# Patient Record
Sex: Female | Born: 1935 | Race: White | Hispanic: No | Smoking: Former smoker
Health system: Southern US, Community
[De-identification: ages and names within clinical notes are randomized; demographics above are authoritative.]

## PROBLEM LIST (undated history)

## (undated) DIAGNOSIS — I5022 Chronic systolic (congestive) heart failure: Secondary | ICD-10-CM

## (undated) DIAGNOSIS — J310 Chronic rhinitis: Secondary | ICD-10-CM

## (undated) DIAGNOSIS — Z95810 Presence of automatic (implantable) cardiac defibrillator: Secondary | ICD-10-CM

## (undated) DIAGNOSIS — M1990 Unspecified osteoarthritis, unspecified site: Secondary | ICD-10-CM

## (undated) DIAGNOSIS — G4730 Sleep apnea, unspecified: Secondary | ICD-10-CM

## (undated) DIAGNOSIS — R531 Weakness: Secondary | ICD-10-CM

## (undated) DIAGNOSIS — Z9889 Other specified postprocedural states: Secondary | ICD-10-CM

## (undated) DIAGNOSIS — R079 Chest pain, unspecified: Secondary | ICD-10-CM

## (undated) DIAGNOSIS — R05 Cough: Secondary | ICD-10-CM

## (undated) DIAGNOSIS — K635 Polyp of colon: Secondary | ICD-10-CM

## (undated) DIAGNOSIS — F329 Major depressive disorder, single episode, unspecified: Secondary | ICD-10-CM

## (undated) DIAGNOSIS — G8929 Other chronic pain: Secondary | ICD-10-CM

## (undated) DIAGNOSIS — E079 Disorder of thyroid, unspecified: Secondary | ICD-10-CM

## (undated) DIAGNOSIS — K219 Gastro-esophageal reflux disease without esophagitis: Secondary | ICD-10-CM

## (undated) DIAGNOSIS — G2581 Restless legs syndrome: Secondary | ICD-10-CM

## (undated) DIAGNOSIS — R058 Other specified cough: Secondary | ICD-10-CM

## (undated) DIAGNOSIS — R296 Repeated falls: Secondary | ICD-10-CM

## (undated) DIAGNOSIS — E039 Hypothyroidism, unspecified: Secondary | ICD-10-CM

## (undated) DIAGNOSIS — G4733 Obstructive sleep apnea (adult) (pediatric): Secondary | ICD-10-CM

## (undated) DIAGNOSIS — R002 Palpitations: Secondary | ICD-10-CM

## (undated) DIAGNOSIS — T464X5A Adverse effect of angiotensin-converting-enzyme inhibitors, initial encounter: Secondary | ICD-10-CM

## (undated) DIAGNOSIS — I509 Heart failure, unspecified: Secondary | ICD-10-CM

## (undated) DIAGNOSIS — N189 Chronic kidney disease, unspecified: Secondary | ICD-10-CM

## (undated) DIAGNOSIS — F0390 Unspecified dementia without behavioral disturbance: Secondary | ICD-10-CM

## (undated) DIAGNOSIS — Z9989 Dependence on other enabling machines and devices: Secondary | ICD-10-CM

## (undated) DIAGNOSIS — E876 Hypokalemia: Secondary | ICD-10-CM

## (undated) DIAGNOSIS — W19XXXA Unspecified fall, initial encounter: Secondary | ICD-10-CM

## (undated) DIAGNOSIS — M549 Dorsalgia, unspecified: Secondary | ICD-10-CM

## (undated) DIAGNOSIS — J42 Unspecified chronic bronchitis: Secondary | ICD-10-CM

## (undated) DIAGNOSIS — N289 Disorder of kidney and ureter, unspecified: Secondary | ICD-10-CM

## (undated) DIAGNOSIS — F32A Depression, unspecified: Secondary | ICD-10-CM

## (undated) DIAGNOSIS — I4891 Unspecified atrial fibrillation: Secondary | ICD-10-CM

## (undated) DIAGNOSIS — F419 Anxiety disorder, unspecified: Secondary | ICD-10-CM

## (undated) HISTORY — DX: Chronic kidney disease, unspecified: N18.9

## (undated) HISTORY — DX: Gastro-esophageal reflux disease without esophagitis: K21.9

## (undated) HISTORY — DX: Disorder of kidney and ureter, unspecified: N28.9

## (undated) HISTORY — DX: Weakness: R53.1

## (undated) HISTORY — DX: Unspecified osteoarthritis, unspecified site: M19.90

## (undated) HISTORY — DX: Cough: R05

## (undated) HISTORY — DX: Other specified postprocedural states: Z98.890

## (undated) HISTORY — DX: Sleep apnea, unspecified: G47.30

## (undated) HISTORY — DX: Polyp of colon: K63.5

## (undated) HISTORY — DX: Chronic rhinitis: J31.0

## (undated) HISTORY — DX: Unspecified chronic bronchitis: J42

## (undated) HISTORY — DX: Anxiety disorder, unspecified: F41.9

## (undated) HISTORY — DX: Hypokalemia: E87.6

## (undated) HISTORY — DX: Dependence on other enabling machines and devices: Z99.89

## (undated) HISTORY — DX: Presence of automatic (implantable) cardiac defibrillator: Z95.810

## (undated) HISTORY — DX: Unspecified dementia, unspecified severity, without behavioral disturbance, psychotic disturbance, mood disturbance, and anxiety: F03.90

## (undated) HISTORY — DX: Repeated falls: R29.6

## (undated) HISTORY — DX: Major depressive disorder, single episode, unspecified: F32.9

## (undated) HISTORY — DX: Restless legs syndrome: G25.81

## (undated) HISTORY — DX: Chronic systolic (congestive) heart failure: I50.22

## (undated) HISTORY — DX: Hypothyroidism, unspecified: E03.9

## (undated) HISTORY — DX: Dorsalgia, unspecified: M54.9

## (undated) HISTORY — DX: Depression, unspecified: F32.A

## (undated) HISTORY — DX: Adverse effect of angiotensin-converting-enzyme inhibitors, initial encounter: T46.4X5A

## (undated) HISTORY — DX: Disorder of thyroid, unspecified: E07.9

## (undated) HISTORY — DX: Heart failure, unspecified: I50.9

## (undated) HISTORY — DX: Unspecified fall, initial encounter: W19.XXXA

## (undated) HISTORY — DX: Unspecified atrial fibrillation: I48.91

## (undated) HISTORY — DX: Other specified cough: R05.8

## (undated) HISTORY — PX: SKIN CANCER EXCISION: SHX779

## (undated) HISTORY — DX: Chest pain, unspecified: R07.9

## (undated) HISTORY — DX: Obstructive sleep apnea (adult) (pediatric): G47.33

## (undated) HISTORY — DX: Other chronic pain: G89.29

## (undated) HISTORY — DX: Palpitations: R00.2

---

## 1971-05-11 HISTORY — PX: BACK SURGERY: SHX140

## 2003-05-11 DIAGNOSIS — Z9889 Other specified postprocedural states: Secondary | ICD-10-CM

## 2003-05-11 HISTORY — DX: Other specified postprocedural states: Z98.890

## 2003-05-11 HISTORY — PX: CARDIAC DEFIBRILLATOR PLACEMENT: SHX171

## 2003-05-13 LAB — HM PAP SMEAR

## 2003-05-13 LAB — HM COLONOSCOPY

## 2003-05-13 LAB — HM DEXA SCAN

## 2003-05-13 LAB — HM MAMMOGRAPHY

## 2003-07-26 ENCOUNTER — Inpatient Hospital Stay (HOSPITAL_COMMUNITY): Admission: EM | Admit: 2003-07-26 | Discharge: 2003-07-30 | Payer: Self-pay | Admitting: Emergency Medicine

## 2003-07-26 ENCOUNTER — Encounter (INDEPENDENT_AMBULATORY_CARE_PROVIDER_SITE_OTHER): Payer: Self-pay | Admitting: *Deleted

## 2003-07-29 ENCOUNTER — Encounter (INDEPENDENT_AMBULATORY_CARE_PROVIDER_SITE_OTHER): Payer: Self-pay | Admitting: Cardiology

## 2003-07-29 HISTORY — PX: CARDIAC CATHETERIZATION: SHX172

## 2003-11-20 ENCOUNTER — Other Ambulatory Visit: Admission: RE | Admit: 2003-11-20 | Discharge: 2003-11-20 | Payer: Self-pay | Admitting: Obstetrics and Gynecology

## 2003-11-27 ENCOUNTER — Encounter: Admission: RE | Admit: 2003-11-27 | Discharge: 2003-11-27 | Payer: Self-pay | Admitting: Obstetrics and Gynecology

## 2003-12-16 ENCOUNTER — Ambulatory Visit (HOSPITAL_COMMUNITY): Admission: RE | Admit: 2003-12-16 | Discharge: 2003-12-16 | Payer: Self-pay | Admitting: Gastroenterology

## 2003-12-16 ENCOUNTER — Encounter (INDEPENDENT_AMBULATORY_CARE_PROVIDER_SITE_OTHER): Payer: Self-pay | Admitting: *Deleted

## 2004-01-10 ENCOUNTER — Inpatient Hospital Stay (HOSPITAL_COMMUNITY): Admission: AD | Admit: 2004-01-10 | Discharge: 2004-01-11 | Payer: Self-pay | Admitting: Internal Medicine

## 2004-01-10 HISTORY — PX: INSERT / REPLACE / REMOVE PACEMAKER: SUR710

## 2004-01-15 ENCOUNTER — Inpatient Hospital Stay (HOSPITAL_COMMUNITY): Admission: AD | Admit: 2004-01-15 | Discharge: 2004-01-16 | Payer: Self-pay | Admitting: Internal Medicine

## 2004-01-15 HISTORY — PX: CARDIAC DEFIBRILLATOR PLACEMENT: SHX171

## 2004-03-13 ENCOUNTER — Inpatient Hospital Stay (HOSPITAL_COMMUNITY): Admission: EM | Admit: 2004-03-13 | Discharge: 2004-03-16 | Payer: Self-pay | Admitting: Emergency Medicine

## 2004-04-22 ENCOUNTER — Ambulatory Visit: Payer: Self-pay

## 2004-04-22 ENCOUNTER — Ambulatory Visit: Payer: Self-pay | Admitting: Internal Medicine

## 2004-09-14 ENCOUNTER — Ambulatory Visit: Payer: Self-pay | Admitting: Internal Medicine

## 2004-11-17 ENCOUNTER — Ambulatory Visit: Payer: Self-pay

## 2004-11-17 ENCOUNTER — Ambulatory Visit: Payer: Self-pay | Admitting: Internal Medicine

## 2004-11-27 IMAGING — CR DG CHEST 1V PORT
1 series · 1 of 1 positions shown · non-contrast
Comparison: yesterday.

CLINICAL DATA: congestive heart failure; post-pacemaker placement
 PORTABLE CHEST 01/16/04 ([DATE] hours):

[view not recorded]
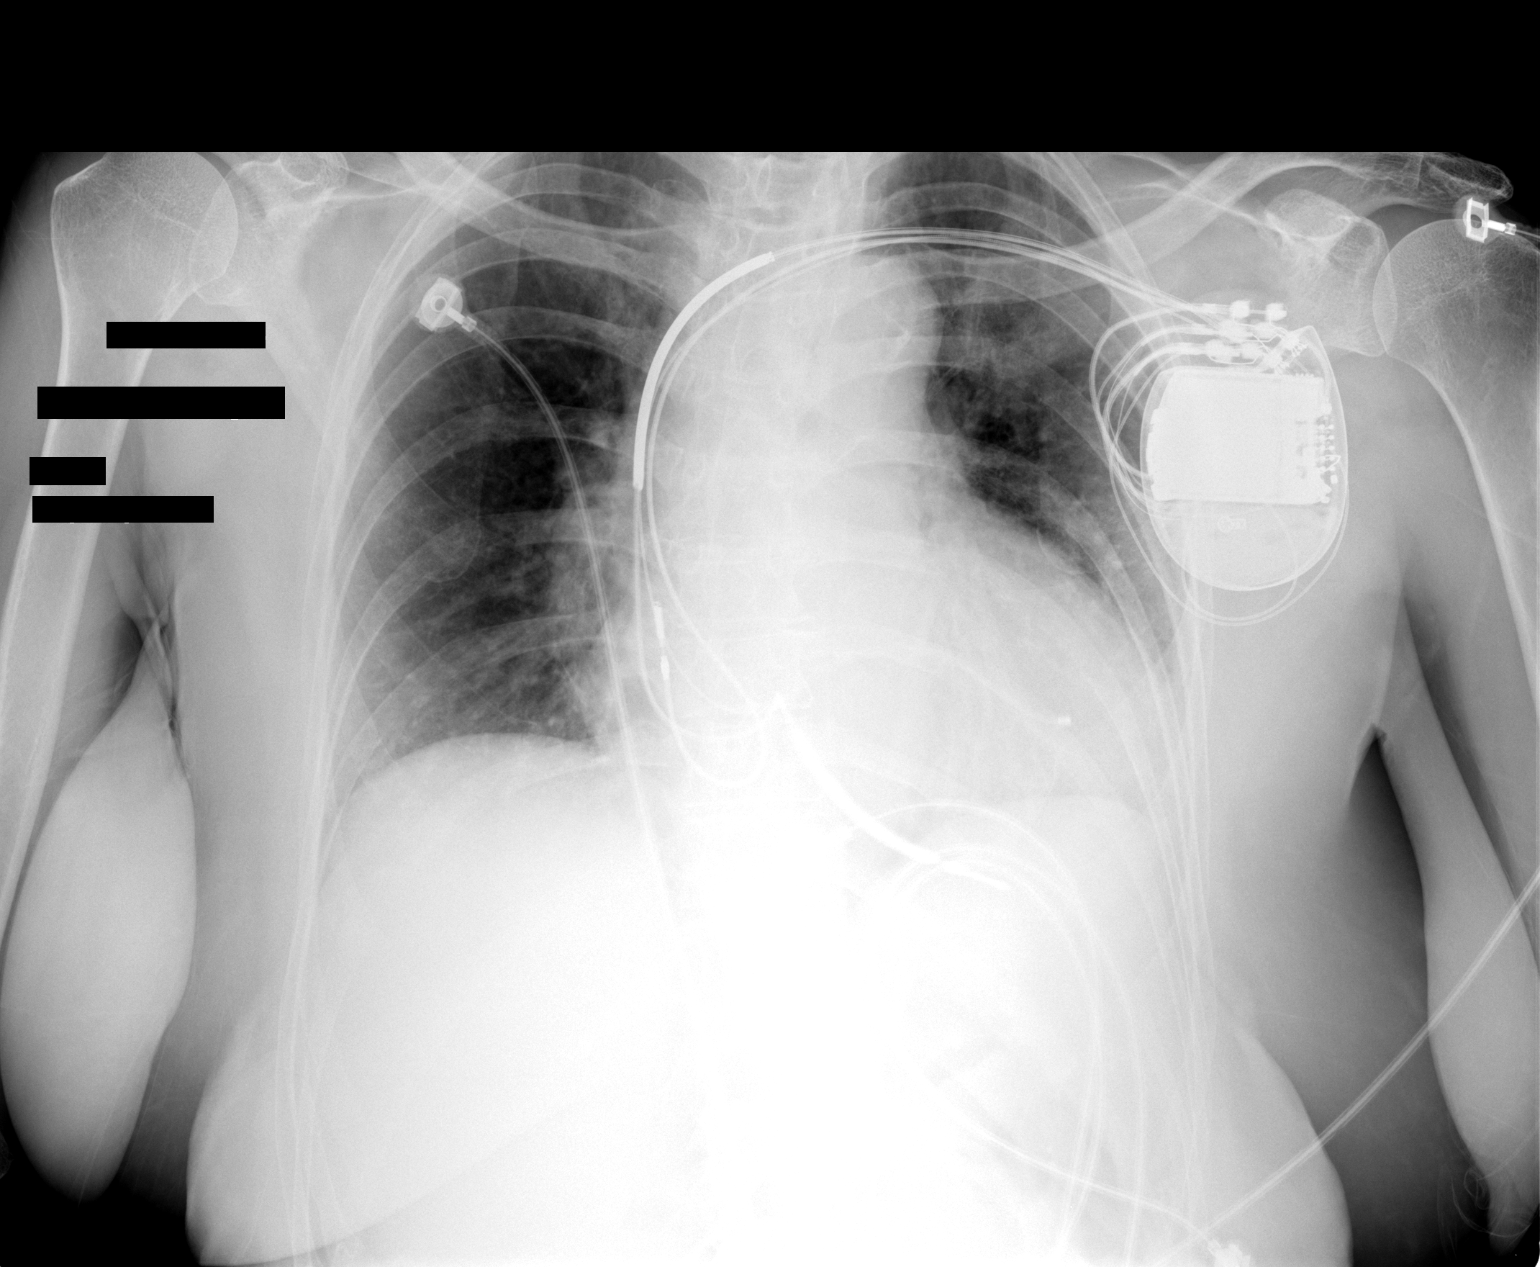

[1 of 1 positions shown; findings below may reference images not displayed]

There is a new left subclavian transvenous AICD with biventricular leads and a right atrial lead.  There is no pneumothorax.  The heart remains enlarged.  There are lower lung volumes with resulting mild basilar atelectasis and vascular congestion.  No overt pulmonary edema seen.
 IMPRESSION
 AICD leads satisfactorily positioned.  
 Low lung volumes with vascular congestion.  No pneumothorax.

## 2005-06-15 ENCOUNTER — Ambulatory Visit: Payer: Self-pay | Admitting: Internal Medicine

## 2005-08-30 ENCOUNTER — Ambulatory Visit: Payer: Self-pay | Admitting: Pulmonary Disease

## 2005-09-13 ENCOUNTER — Ambulatory Visit: Payer: Self-pay | Admitting: Pulmonary Disease

## 2005-09-13 ENCOUNTER — Ambulatory Visit (HOSPITAL_BASED_OUTPATIENT_CLINIC_OR_DEPARTMENT_OTHER): Admission: RE | Admit: 2005-09-13 | Discharge: 2005-09-13 | Payer: Self-pay | Admitting: Pulmonary Disease

## 2005-09-30 ENCOUNTER — Ambulatory Visit: Payer: Self-pay | Admitting: Pulmonary Disease

## 2005-10-02 ENCOUNTER — Ambulatory Visit (HOSPITAL_BASED_OUTPATIENT_CLINIC_OR_DEPARTMENT_OTHER): Admission: RE | Admit: 2005-10-02 | Discharge: 2005-10-02 | Payer: Self-pay | Admitting: Pulmonary Disease

## 2005-11-03 ENCOUNTER — Ambulatory Visit: Payer: Self-pay | Admitting: Pulmonary Disease

## 2005-11-30 ENCOUNTER — Ambulatory Visit: Payer: Self-pay | Admitting: Pulmonary Disease

## 2006-02-28 ENCOUNTER — Ambulatory Visit: Payer: Self-pay | Admitting: Pulmonary Disease

## 2006-08-09 ENCOUNTER — Ambulatory Visit: Payer: Self-pay | Admitting: Pulmonary Disease

## 2006-09-08 ENCOUNTER — Ambulatory Visit: Payer: Self-pay | Admitting: Pulmonary Disease

## 2006-10-12 ENCOUNTER — Ambulatory Visit: Payer: Self-pay | Admitting: Pulmonary Disease

## 2007-04-20 DIAGNOSIS — I5032 Chronic diastolic (congestive) heart failure: Secondary | ICD-10-CM

## 2007-04-20 DIAGNOSIS — G2581 Restless legs syndrome: Secondary | ICD-10-CM | POA: Insufficient documentation

## 2007-04-20 DIAGNOSIS — G4733 Obstructive sleep apnea (adult) (pediatric): Secondary | ICD-10-CM

## 2007-08-01 ENCOUNTER — Ambulatory Visit: Payer: Self-pay | Admitting: Pulmonary Disease

## 2007-08-01 DIAGNOSIS — J310 Chronic rhinitis: Secondary | ICD-10-CM

## 2007-08-01 DIAGNOSIS — J42 Unspecified chronic bronchitis: Secondary | ICD-10-CM

## 2007-08-04 ENCOUNTER — Encounter: Payer: Self-pay | Admitting: Pulmonary Disease

## 2007-09-23 ENCOUNTER — Encounter: Admission: RE | Admit: 2007-09-23 | Discharge: 2007-09-23 | Payer: Self-pay | Admitting: Orthopaedic Surgery

## 2007-12-16 ENCOUNTER — Inpatient Hospital Stay (HOSPITAL_COMMUNITY): Admission: EM | Admit: 2007-12-16 | Discharge: 2007-12-20 | Payer: Self-pay | Admitting: Emergency Medicine

## 2008-05-07 ENCOUNTER — Telehealth (INDEPENDENT_AMBULATORY_CARE_PROVIDER_SITE_OTHER): Payer: Self-pay | Admitting: *Deleted

## 2008-05-10 HISTORY — PX: FEMUR FRACTURE SURGERY: SHX633

## 2008-05-21 ENCOUNTER — Ambulatory Visit: Payer: Self-pay | Admitting: Pulmonary Disease

## 2008-05-23 ENCOUNTER — Encounter: Payer: Self-pay | Admitting: Pulmonary Disease

## 2008-12-12 ENCOUNTER — Ambulatory Visit: Payer: Self-pay | Admitting: Pulmonary Disease

## 2008-12-26 ENCOUNTER — Inpatient Hospital Stay (HOSPITAL_COMMUNITY): Admission: AD | Admit: 2008-12-26 | Discharge: 2009-01-01 | Payer: Self-pay | Admitting: Specialist

## 2008-12-26 ENCOUNTER — Encounter: Payer: Self-pay | Admitting: Emergency Medicine

## 2009-03-14 ENCOUNTER — Ambulatory Visit: Payer: Self-pay | Admitting: Internal Medicine

## 2009-03-14 DIAGNOSIS — I4891 Unspecified atrial fibrillation: Secondary | ICD-10-CM

## 2009-03-17 ENCOUNTER — Ambulatory Visit (HOSPITAL_COMMUNITY): Admission: RE | Admit: 2009-03-17 | Discharge: 2009-03-17 | Payer: Self-pay | Admitting: Internal Medicine

## 2009-03-17 ENCOUNTER — Ambulatory Visit: Payer: Self-pay | Admitting: Internal Medicine

## 2009-03-17 HISTORY — PX: IMPLANTABLE CARDIOVERTER DEFIBRILLATOR IMPLANT: SHX5860

## 2009-03-20 ENCOUNTER — Ambulatory Visit: Payer: Self-pay

## 2009-03-20 ENCOUNTER — Ambulatory Visit: Payer: Self-pay | Admitting: Internal Medicine

## 2009-03-20 DIAGNOSIS — E876 Hypokalemia: Secondary | ICD-10-CM

## 2009-03-21 LAB — CONVERTED CEMR LAB
BUN: 21 mg/dL (ref 6–23)
CO2: 31 meq/L (ref 19–32)
Calcium: 9.9 mg/dL (ref 8.4–10.5)
Chloride: 101 meq/L (ref 96–112)
Creatinine, Ser: 1.4 mg/dL — ABNORMAL HIGH (ref 0.4–1.2)
GFR calc non Af Amer: 38.23 mL/min (ref >60)
Glucose, Bld: 104 mg/dL — ABNORMAL HIGH (ref 70–99)
Potassium: 5 meq/L (ref 3.5–5.1)
Sodium: 140 meq/L (ref 135–145)

## 2009-03-25 ENCOUNTER — Telehealth: Payer: Self-pay | Admitting: Internal Medicine

## 2009-03-26 ENCOUNTER — Encounter: Payer: Self-pay | Admitting: Internal Medicine

## 2009-03-27 ENCOUNTER — Encounter: Payer: Self-pay | Admitting: Internal Medicine

## 2009-04-02 ENCOUNTER — Encounter: Payer: Self-pay | Admitting: Internal Medicine

## 2009-04-02 ENCOUNTER — Ambulatory Visit: Payer: Self-pay

## 2009-04-18 ENCOUNTER — Ambulatory Visit: Payer: Self-pay | Admitting: Pulmonary Disease

## 2009-04-18 DIAGNOSIS — R002 Palpitations: Secondary | ICD-10-CM

## 2009-05-13 ENCOUNTER — Encounter: Payer: Self-pay | Admitting: Pulmonary Disease

## 2009-05-28 ENCOUNTER — Ambulatory Visit: Payer: Self-pay | Admitting: Pulmonary Disease

## 2009-06-03 ENCOUNTER — Ambulatory Visit: Payer: Self-pay | Admitting: Internal Medicine

## 2009-06-03 DIAGNOSIS — T82198A Other mechanical complication of other cardiac electronic device, initial encounter: Secondary | ICD-10-CM | POA: Insufficient documentation

## 2009-07-01 ENCOUNTER — Ambulatory Visit: Payer: Self-pay | Admitting: Pulmonary Disease

## 2009-07-01 DIAGNOSIS — R079 Chest pain, unspecified: Secondary | ICD-10-CM

## 2009-07-29 ENCOUNTER — Ambulatory Visit: Payer: Self-pay | Admitting: Internal Medicine

## 2009-10-27 ENCOUNTER — Ambulatory Visit: Payer: Self-pay | Admitting: Internal Medicine

## 2009-11-05 ENCOUNTER — Ambulatory Visit: Payer: Self-pay | Admitting: Pulmonary Disease

## 2009-11-21 ENCOUNTER — Encounter: Payer: Self-pay | Admitting: Internal Medicine

## 2009-12-18 ENCOUNTER — Ambulatory Visit: Payer: Self-pay | Admitting: Cardiology

## 2009-12-23 ENCOUNTER — Ambulatory Visit (HOSPITAL_COMMUNITY): Admission: RE | Admit: 2009-12-23 | Discharge: 2009-12-23 | Payer: Self-pay | Admitting: Cardiology

## 2009-12-23 ENCOUNTER — Ambulatory Visit: Payer: Self-pay | Admitting: Cardiology

## 2009-12-23 ENCOUNTER — Encounter: Payer: Self-pay | Admitting: Internal Medicine

## 2010-01-29 ENCOUNTER — Ambulatory Visit: Payer: Self-pay | Admitting: Internal Medicine

## 2010-02-18 ENCOUNTER — Encounter: Payer: Self-pay | Admitting: Internal Medicine

## 2010-04-13 ENCOUNTER — Ambulatory Visit: Payer: Self-pay | Admitting: Cardiology

## 2010-05-07 ENCOUNTER — Ambulatory Visit: Payer: Self-pay | Admitting: Internal Medicine

## 2010-05-20 ENCOUNTER — Encounter (INDEPENDENT_AMBULATORY_CARE_PROVIDER_SITE_OTHER): Payer: Self-pay | Admitting: *Deleted

## 2010-06-07 LAB — CONVERTED CEMR LAB
BUN: 24 mg/dL — ABNORMAL HIGH (ref 6–23)
Basophils Absolute: 0 10*3/uL (ref 0.0–0.1)
Basophils Relative: 0.1 % (ref 0.0–3.0)
CO2: 31 meq/L (ref 19–32)
Calcium: 8.9 mg/dL (ref 8.4–10.5)
Chloride: 102 meq/L (ref 96–112)
Creatinine, Ser: 1.4 mg/dL — ABNORMAL HIGH (ref 0.4–1.2)
Eosinophils Absolute: 0.1 10*3/uL (ref 0.0–0.7)
Eosinophils Relative: 2.2 % (ref 0.0–5.0)
GFR calc non Af Amer: 38.23 mL/min (ref >60)
Glucose, Bld: 100 mg/dL — ABNORMAL HIGH (ref 70–99)
HCT: 33.6 % — ABNORMAL LOW (ref 36.0–46.0)
Hemoglobin: 11.3 g/dL — ABNORMAL LOW (ref 12.0–15.0)
INR: 1.1 — ABNORMAL HIGH (ref 0.8–1.0)
Lymphocytes Relative: 16.3 % (ref 12.0–46.0)
Lymphs Abs: 0.7 10*3/uL (ref 0.7–4.0)
MCHC: 33.7 g/dL (ref 30.0–36.0)
MCV: 95 fL (ref 78.0–100.0)
Monocytes Absolute: 0.3 10*3/uL (ref 0.1–1.0)
Monocytes Relative: 7.9 % (ref 3.0–12.0)
Neutro Abs: 3.1 10*3/uL (ref 1.4–7.7)
Neutrophils Relative %: 73.5 % (ref 43.0–77.0)
Platelets: 168 10*3/uL (ref 150.0–400.0)
Potassium: 2.9 meq/L — ABNORMAL LOW (ref 3.5–5.1)
Prothrombin Time: 11.6 s (ref 9.1–11.7)
RBC: 3.54 M/uL — ABNORMAL LOW (ref 3.87–5.11)
RDW: 15.4 % — ABNORMAL HIGH (ref 11.5–14.6)
Sodium: 144 meq/L (ref 135–145)
WBC: 4.2 10*3/uL — ABNORMAL LOW (ref 4.5–10.5)
aPTT: 30.8 s — ABNORMAL HIGH (ref 21.7–28.8)

## 2010-06-09 NOTE — Cardiovascular Report (Signed)
Summary: Office Visit Remote   Office Visit Remote   Imported By: Roderic Ovens 02/19/2010 11:12:53  _____________________________________________________________________  External Attachment:    Type:   Image     Comment:   External Document

## 2010-06-09 NOTE — Cardiovascular Report (Signed)
Summary: Office Visit Remote   Office Visit Remote   Imported By: Roderic Ovens 11/24/2009 12:32:40  _____________________________________________________________________  External Attachment:    Type:   Image     Comment:   External Document

## 2010-06-09 NOTE — Procedures (Signed)
Summary: Cardiology Device Clinic   Allergies: No Known Drug Allergies   ICD Specifications Following MD:  Sherryl Manges, MD     Referring MD:  [facility] ICD Vendor:  St Jude     ICD Model Number:  [id number]     ICD Serial Number:  [id number] ICD DOI:  03/17/2009     ICD Implanting MD:  Sherryl Manges, MD  Lead 1:    Location: RA     Model #: [id number]     Serial #: [id number]     Status: active Lead 2:    Location: RV     Model #: 1581     Serial #: [id number]     Status: active Lead 3:    Location: LV     Model #: [id number]     Serial #: [id number]     Status: active  ICD Follow Up Remote Check?  No Charge Time:  8.6 seconds     Battery Est. Longevity:  7.6 years ICD Dependent:  No       ICD Device Measurements Atrium:  Amplitude: 2.4 mV, Impedance: 530 ohms, Threshold: 1.0 V at 0.5 msec Right Ventricle:  Amplitude: 11.7 mV, Impedance: 460 ohms, Threshold: 0.625 V at 0.5 msec Configuration: LV TIP TO RV COIL Shock Impedance: 41 ohms   Episodes MS Episodes:  0     Percent Mode Switch:  0     Coumadin:  No Shock:  0     ATP:  0     Nonsustained:  0     Atrial Pacing:  4%     Ventricular Pacing:  2%  Brady Parameters Mode DDD     Lower Rate Limit:  60     Upper Rate Limit 120 PAV 170     Sensed AV Delay:  120  Tachy Zones VF:  200     VT:  160     VT1:  141     Next Cardiology Appt Due:  10/08/2009 Tech Comments:  No parameter changes.  Checked by [employer]. Altha Harm, LPN  July 30, 2009 9:20 AM

## 2010-06-09 NOTE — Letter (Signed)
Summary: Remote Device Check  [employer], Main Office  [address]   [place], [place] [address]   Phone: [phone]  Fax: [phone]     November 21, 2009 MRN: [id number]   [facility] [address] [place], [place]  [address]   Dear [name],   Your remote transmission was recieved and reviewed by your physician.  All diagnostics were within normal limits for you.  __X___Your next transmission is scheduled for:  01-29-2010.  Please transmit at any time this day.  If you have a wireless device your transmission will be sent automatically.   Sincerely,  Vella Kohler

## 2010-06-09 NOTE — Assessment & Plan Note (Signed)
Summary: st jude/sl   Referring Provider:  [name], [name] Primary Provider:  Guerry Bruin  CC:  pt has been feeling a "jumping" sensation coming from her defib.  [name]  History of Present Illness: [name] [name] is seen in followup for nonischemic cardiomyopathy with congestive heart failure for which she underwent CRT-D implantation in 2005. She recently underwent device generator replacement.     since generator replacement  she has noted diaphragmatic stimulation. This was not noticed with the prior device.  Her shortness of breath has been relatively stable   Her appetite has improved and her weight loss has stopped.       Current Medications (verified): 1)  Spiriva Handihaler 18 Mcg  Caps (Tiotropium Bromide Monohydrate) .... Once Daily 2)  Nasonex 50 Mcg/act Susp (Mometasone Furoate) .... Two Sprays Each Nostril Once Daily 3)  Carvedilol 25 Mg  Tabs (Carvedilol) .... Take 1/2 Tablet in The Morning, 1/2 Tablet in The Evening 4)  Furosemide 40 Mg  Tabs (Furosemide) .... Two By Mouth Qam and One By Mouth Qpm 5)  Aspirin 325 Mg  Tabs (Aspirin) .... Take 1 Tablet By Mouth Once A Day 6)  Pravachol 40 Mg  Tabs (Pravastatin Sodium) .... Take 1 Tablet By Mouth Once A Day 7)  Tricor 145 Mg  Tabs (Fenofibrate) .... Take 1 Tablet By Mouth Once A Day 8)  Synthroid 88 Mcg Tabs (Levothyroxine Sodium) .[name].. 1 By Mouth Daily 9)  Zoloft 100 Mg Tabs (Sertraline Hcl) .... Once Daily 10)  Fentanyl 25 Mcg/hr Pt72 (Fentanyl) .... Every 3 Days 11)  Ferrous Sulfate 325 (65 Fe) Mg Tbec (Ferrous Sulfate) .... Take One Tablet in The Am, One Tablet in  The Evening 12)  Digoxin 0.125 Mg Tabs (Digoxin) .... in The Morning 13)  Vitamin C 500 Mg Chew (Ascorbic Acid) .... One By Mouth Daily 14)  Vitamin D3 2000 Unit Caps (Cholecalciferol) .... Take 1 Tablet By Mouth Once A Day 15)  Calcium 500 Mg Tabs (Calcium Carbonate) .... Take 1 Tablet By Mouth Two Times A Day 16)  Klor-Con M20 20 Meq  Cr-Tabs (Potassium Chloride Crys Cr) .... Take 2 Tablet By Mouth Every Morning and Take 1 Tab By Mouth At Bedtime  Allergies (verified): No Known Drug Allergies  Past History:  Past Medical History: Last updated: 06/02/2009 Paroxysmal atrial fibrillation OSA      - AHI 31.5 from PSG 05/07      - CPAP 11 cm H2O Restless leg syndrome ACE induced cough Chronic bronchitis      - PFT 10/12/06 FEV1 1.81(107%), FVC 2.06(83%), FEV1% 88, TLC 4.30(95%), DLCO 56%, no BD      - had suggestion of obstruction on flow/volume curve Chronic back pain Non-ischemic CM Biventricular defibrillator placed 2005-St Jude Atlas v-340-explanted Unify St[employer] Jude ICD model [id number] Left Pneumothorax 2005 after defibrillator placement hyperlipidemia Arthritis Depression Subdural hematoma x2  Chronic renal insufficiency Hypothyroidism Osteo-arthritis Colon polyps Pneumonia 08/09 Chronic rhinitis GERD Right broken Femur-12/26/2008  Past Surgical History: Last updated: 06/02/2009 Back surgery 1960's AICD 2005- St. Jude Atlas V-340-explanted Unify St. Jude ICD model [phone] pocket revision Skin cancer removal 2007  Family History: Last updated: 12/12/2008 Father - Heart disease Mother - Colon cancer  Social History: Last updated: 12/12/2008 Married.  Retired.  Two adopted daughters.  Quit smoking in 2004.  Vital Signs:  Patient profile:   77 year old female Height:      66 inches Weight:      126 pounds BMI:  20.41 Pulse rate:   72 / minute Pulse rhythm:   irregular BP sitting:   128 / 64  (right arm) Cuff size:   regular  Vitals Entered By: Judithe Modest CMA (June 03, 2009 9:36 AM)  Physical Exam  General:  The patient was alert and oriented in no acute distress.  she appears quite thin and very tired HEENT Normal.  Neck veins were notable for prominent V wave carotids were brisk.  Lungs were clear.  Heart sounds were regular with 2/6 murmur at the apex Abdomen was soft  with active bowel sounds. There is no clubbing cyanosis or edema. Skin Warm and dry     ICD Specifications Following MD:  Sherryl Manges, MD     Referring MD:  [facility] ICD Vendor:  St Jude     ICD Model Number:  [phone]     ICD Serial Number:  [id number] ICD DOI:  03/17/2009     ICD Implanting MD:  Sherryl Manges, MD  Lead 1:    Location: RA     Model #: [id number]     Serial #: [id number]     Status: active Lead 2:    Location: RV     Model #: 1581     Serial #: [id number]     Status: active Lead 3:    Location: LV     Model #: [id number]     Serial #: [id number]     Status: active  ICD Follow Up Remote Check?  No Battery Voltage:  95% V     Charge Time:  8.1 seconds     Battery Est. Longevity:  5.1 YEARS Underlying rhythm:  SR ICD Dependent:  No       ICD Device Measurements Atrium:  Amplitude: 3.3 mV, Impedance: 480 ohms, Threshold: 0.625 V at 0.5 msec Right Ventricle:  Amplitude: 11.7 mV, Impedance: 400 ohms, Threshold: 0.625 V at 0.5 msec Left Ventricle:  Impedance: 550 ohms, Threshold: 2.5 V at 1.5 msec Configuration: LV TIP TO RV COIL Shock Impedance: 37 ohms   Episodes MS Episodes:  4     Percent Mode Switch:  <1%     Coumadin:  No Shock:  0     ATP:  0     Nonsustained:  0     Atrial Pacing:  1.2%     Ventricular Pacing:  >99%  Brady Parameters Mode DDD     Lower Rate Limit:  60     Upper Rate Limit 120 PAV 170     Sensed AV Delay:  120  Tachy Zones VF:  200     VT:  160     VT1:  141     Next Remote Date:  09/01/2009     Next Cardiology Appt Due:  02/07/2010 Tech Comments:  Normal device function.  Unable to program around diaphragmatic stim, LV output left programmed at 2.75V at 1.52msec.  All mode switch episodes less than 1 minute.  Appear to be FFRW from EGM's.  LV lead turned off to see how she feels without CRT therapy. [name] Balsam RN BSN  June 03, 2009 10:05 AM   Impression & Recommendations:  Problem # 1:  IMPLANTATION OF DEFIBRILLATOR, ST. JUDE ATLAS V-340 (ICD-V45.02) Device  parameters and data were reviewed and no changes were made  multiple pacing outputs were attempted to try to reprogram around diaphragmatic stimulation. These were not successful. I discussed 3 options with the patient and her daughter. The first was  to inactivate the left ventricular lead, the second was to live with the problem and the third is to consider surgical repositioning. We have elected in thm to do the first. Will plan to see her in fire 6 weeks' time to review this. She is also advised that she could have deterioration in functional status that might necessitate a reasonably pacing  Problem # 2:  MECH COMP DUE AUTO IMPLANTABLE CARD DEFIB (ICD-996.04) diaphragmatic stimulation is noted please see the above discussion  Problem # 3:  CARDIOMYOPATHY-NONISCHEMIC (ICD-425.4) continue on her current medications. She is not on ACE inhibitors or aldactone I will defer these decisions to Dr. Patty Sermons Her updated medication list for this problem includes:    Carvedilol 25 Mg Tabs (Carvedilol) .[name]... Take 1/2 tablet in the morning, 1/2 tablet in the evening    Furosemide 40 Mg Tabs (Furosemide) .[name]..[name] Two by mouth qam and one by mouth qpm    Aspirin 325 Mg Tabs (Aspirin) .[name]... Take 1 tablet by mouth once a day    Digoxin 0.125 Mg Tabs (Digoxin) ..... In the morning  Problem # 4:  SYSTOLIC HEART FAILURE, CHRONIC (ICD-428.22) As noted above we'll have to see how she does with CRT inactivated Her updated medication list for this problem includes:    Carvedilol 25 Mg Tabs (Carvedilol) .[name]... Take 1/2 tablet in the morning, 1/2 tablet in the evening    Furosemide 40 Mg Tabs (Furosemide) .[name]..[name] Two by mouth qam and one by mouth qpm    Aspirin 325 Mg Tabs (Aspirin) .[name]... Take 1 tablet by mouth once a day    Digoxin 0.125 Mg Tabs (Digoxin) ..... In the morning  Patient Instructions: 1)  Your physician recommends that you schedule a follow-up appointment in: 5-6 WEEKS

## 2010-06-09 NOTE — Letter (Signed)
Summary: Remote Device Check  [facility] [employer], Main Office  [address]   [place], [place] [address]   Phone: [phone]  Fax: [phone]     February 18, 2010 MRN: [id number]   [facility] [address] [place], [place]  [address]   Dear Ms. Barcelo,   Your remote transmission was recieved and reviewed by your physician.  All diagnostics were within normal limits for you.  __X___Your next transmission is scheduled for:   05-07-2010.  Please transmit at any time this day.  If you have a wireless device your transmission will be sent automatically.   Sincerely,  Vella Kohler

## 2010-06-09 NOTE — Assessment & Plan Note (Signed)
Summary: pc2   Referring Provider:  [name], [name] Primary Provider:  Guerry Bruin  CC:  pc2.  History of Present Illness: [name] [name] is seen in followup for nonischemic cardiomyopathy with congestive heart failure for which she underwent CRT-D implantation in 2005 with device generator replacement in Dec 2010  Following  generator replacement  she has noted diaphragmatic stimulation prompting inactivation of LV pacing.  This had not been noticed with the prior device.  She has noted no changes in her functional status since the inactivationo f the LV lead and running out of her digoxin        Current Medications (verified): 1)  Spiriva Handihaler 18 Mcg  Caps (Tiotropium Bromide Monohydrate) .... Once Daily 2)  Nasonex 50 Mcg/act Susp (Mometasone Furoate) .... Two Sprays Each Nostril Once Daily As Needed 3)  Carvedilol 25 Mg  Tabs (Carvedilol) .... Take 1/2 Tablet in The Morning, 1/2 Tablet in The Evening 4)  Furosemide 40 Mg  Tabs (Furosemide) .... Two By Mouth Qam and One By Mouth Around 2:00 Pm 5)  Aspirin 325 Mg  Tabs (Aspirin) .... Take 1 Tablet By Mouth Once A Day 6)  Pravachol 40 Mg  Tabs (Pravastatin Sodium) .... Take 1 Tablet By Mouth Once A Day 7)  Tricor 145 Mg  Tabs (Fenofibrate) .... Take 1 Tablet By Mouth Once A Day 8)  Synthroid 88 Mcg Tabs (Levothyroxine Sodium) .[name].. 1 By Mouth Daily 9)  Zoloft 100 Mg Tabs (Sertraline Hcl) .... Once Daily 10)  Fentanyl 25 Mcg/hr Pt72 (Fentanyl) .... Every 3 Days 11)  Ferrous Sulfate 325 (65 Fe) Mg Tbec (Ferrous Sulfate) .... Take One Tablet in The Am, One Tablet in  The Evening 12)  Vitamin C 500 Mg Chew (Ascorbic Acid) .... One By Mouth Daily 13)  Vitamin D3 2000 Unit Caps (Cholecalciferol) .... Take 1 Tablet By Mouth Once A Day 14)  Calcium 500 Mg Tabs (Calcium Carbonate) .... Take 1 Tablet By Mouth Two Times A Day 15)  Klor-Con M20 20 Meq Cr-Tabs (Potassium Chloride Crys Cr) .... Take 2 Tablet By Mouth Every  Morning and Take 1 Tab By Mouth At Bedtime 16)  Prilosec 20 Mg Cpdr (Omeprazole) .... Take One Capsule Daily  Allergies (verified): No Known Drug Allergies  Past History:  Past Medical History: Last updated: 07/01/2009 Paroxysmal atrial fibrillation OSA      - AHI 31.5 from PSG 05/07      - CPAP 11 cm H2O Restless leg syndrome      - Confusional state while on requip ACE induced cough Chronic bronchitis      - PFT 10/12/06 FEV1 1.81(107%), FVC 2.06(83%), FEV1% 88, TLC 4.30(95%), DLCO 56%, no BD      - had suggestion of obstruction on flow/volume curve Chronic back pain Non-ischemic CM Biventricular defibrillator placed 2005-St Jude Atlas v-340-explanted Unify St. Jude ICD model [phone] Left Pneumothorax 2005 after defibrillator placement hyperlipidemia Arthritis Depression Subdural hematoma x2  Chronic renal insufficiency Hypothyroidism Osteo-arthritis Colon polyps Pneumonia 08/09 Chronic rhinitis GERD Right broken Femur-12/26/2008  Past Surgical History: Last updated: 06/02/2009 Back surgery 1960's AICD 2005- St. Jude Atlas V-340-explanted Unify St. Jude ICD model [phone] pocket revision Skin cancer removal 2007  Family History: Last updated: 12/12/2008 Father - Heart disease Mother - Colon cancer  Social History: Last updated: 12/12/2008 Married.  Retired.  Two adopted daughters.  Quit smoking in 2004.  Vital Signs:  Patient profile:   77 year old female Height:  66 inches Weight:      129 pounds BMI:     20.90 Pulse rhythm:   regular BP sitting:   106 / 68  (left arm) Cuff size:   regular  Vitals Entered By: Judithe Modest CMA (July 29, 2009 4:30 PM)  Physical Exam  General:  The patient was alert and oriented in no acute distress. HEENT Normal.  Neck veins were flat, carotids were brisk.  Lungs were clear.  Heart sounds were regular without murmurs or gallops.  Abdomen was soft with active bowel sounds. There is no clubbing  cyanosis or edema. Skin Warm and dry     ICD Specifications Following MD:  Sherryl Manges, MD     Referring MD:  [facility] ICD Vendor:  St Jude     ICD Model Number:  [phone]     ICD Serial Number:  [id number] ICD DOI:  03/17/2009     ICD Implanting MD:  Sherryl Manges, MD  Lead 1:    Location: RA     Model #: [id number]     Serial #: [id number]     Status: active Lead 2:    Location: RV     Model #: 1581     Serial #: [id number]     Status: active Lead 3:    Location: LV     Model #: [id number]     Serial #: [id number]     Status: active  ICD Follow Up ICD Dependent:  No       ICD Device Measurements Configuration: LV TIP TO RV COIL  Episodes Coumadin:  No  Brady Parameters Mode DDD     Lower Rate Limit:  60     Upper Rate Limit 120 PAV 170     Sensed AV Delay:  120  Tachy Zones VF:  200     VT:  160     VT1:  141     Impression & Recommendations:  Problem # 1:  IMPLANTATION OF DEFIBRILLATOR, ST. JUDE ATLAS V-340 (ICD-V45.02) Device parameters and data were reviewed and no changes were made  Problem # 2:  CARDIOMYOPATHY-NONISCHEMIC (ICD-425.4) Stable  The following medications were removed from the medication list:    Digoxin 0.125 Mg Tabs (Digoxin) ..... In the morning Her updated medication list for this problem includes:    Carvedilol 25 Mg Tabs (Carvedilol) .[name]... Take 1/2 tablet in the morning, 1/2 tablet in the evening    Furosemide 40 Mg Tabs (Furosemide) .[name]..[name] Two by mouth qam and one by mouth around 2:00 pm    Aspirin 325 Mg Tabs (Aspirin) .[name]... Take 1 tablet by mouth once a day  Problem # 3:  SYSTOLIC HEART FAILURE, CHRONIC (ICD-428.22) stable, surprisingly with no LV lead and no dig The following medications were removed from the medication list:    Digoxin 0.125 Mg Tabs (Digoxin) ..... In the morning Her updated medication list for this problem includes:    Carvedilol 25 Mg Tabs (Carvedilol) .[name]... Take 1/2 tablet in the morning, 1/2 tablet in the evening    Furosemide 40 Mg Tabs  (Furosemide) .[name]..[name] Two by mouth qam and one by mouth around 2:00 pm    Aspirin 325 Mg Tabs (Aspirin) .[name]... Take 1 tablet by mouth once a day

## 2010-06-09 NOTE — Miscellaneous (Signed)
Summary: [facility] report from 05/12/09  Clinical Lists Changes AHI 15 compared to baseline of 31.  She is sleeping well except for chronic pain which she is taking medication for.  Will monitor for now. Orders: Added new Service order of Pulse Oximetry, Overnight [phone]) - Signed

## 2010-06-09 NOTE — Assessment & Plan Note (Signed)
Summary: rov 4 months///kp   Visit Type:  Follow-up Copy to:  [name], [name] Primary Provider/Referring Provider:  Guerry Bruin  CC:  OSA. RLS. The patient says she has not used her CPAP in 1 month due to irritation of her nose from the mask and eye ptoblems..  History of Present Illness: 77 yo female with OSA, RLS, Chronic bronchitis with history of tobacco abuse, and Chronic rhinitis, ACE cough  She has not been able to use her CPAP for the past one month.  She was told that she may have a scleral abrasion from her mask rubbing her eye.  She is also getting her teeth extracted.  She feels okay during the day, but will occasionally get tired as the day goes on.  Her breathing has been okay.  She is not having much cough, wheeze, sputum, or chest pain.  Her rib pain has resolved, and she is now off fentanyl patch.     Current Medications (verified): 1)  Spiriva Handihaler 18 Mcg  Caps (Tiotropium Bromide Monohydrate) .... Once Daily 2)  Nasonex 50 Mcg/act Susp (Mometasone Furoate) .... Two Sprays Each Nostril Once Daily As Needed 3)  Carvedilol 25 Mg  Tabs (Carvedilol) .... Take 1/2 Tablet in The Morning, 1/2 Tablet in The Evening 4)  Furosemide 40 Mg  Tabs (Furosemide) .... Two By Mouth Qam and One By Mouth Around 2:00 Pm 5)  Aspirin 325 Mg  Tabs (Aspirin) .... Take 1 Tablet By Mouth Once A Day 6)  Pravachol 40 Mg  Tabs (Pravastatin Sodium) .... Take 1 Tablet By Mouth Once A Day 7)  Tricor 145 Mg  Tabs (Fenofibrate) .... Take 1 Tablet By Mouth Once A Day 8)  Synthroid 88 Mcg Tabs (Levothyroxine Sodium) .[name].. 1 By Mouth Daily 9)  Zoloft 100 Mg Tabs (Sertraline Hcl) .... Once Daily 10)  Ferrous Sulfate 325 (65 Fe) Mg Tbec (Ferrous Sulfate) .... Take One Tablet in The Am, One Tablet in  The Evening 11)  Vitamin C 500 Mg Chew (Ascorbic Acid) .... One By Mouth Daily 12)  Vitamin D3 2000 Unit Caps (Cholecalciferol) .... Take 1 Tablet By Mouth Once A Day 13)  Calcium 500 Mg  Tabs (Calcium Carbonate) .... Take 1 Tablet By Mouth Two Times A Day 14)  Klor-Con M20 20 Meq Cr-Tabs (Potassium Chloride Crys Cr) .... Take 2 Tablet By Mouth Every Morning and Take 1 Tab By Mouth At Bedtime 15)  Prilosec 20 Mg Cpdr (Omeprazole) .... Take One Capsule Daily  Allergies (verified): No Known Drug Allergies  Past History:  Past Medical History: Paroxysmal atrial fibrillation OSA      - AHI 31.5 from PSG 05/07      - Apnealink 05/28/09 AHI 15      - CPAP 11 cm H2O Restless leg syndrome      - Confusional state while on requip ACE induced cough Chronic bronchitis      - PFT 10/12/06 FEV1 1.81(107%), FVC 2.06(83%), FEV1% 88, TLC 4.30(95%), DLCO 56%, no BD      - had suggestion of obstruction on flow/volume curve Chronic back pain Non-ischemic CM Biventricular defibrillator placed 2005-St Jude Atlas v-340-explanted [employer] St. Jude ICD model [phone] Left Pneumothorax 2005 after defibrillator placement hyperlipidemia Arthritis Depression Subdural hematoma x2  Chronic renal insufficiency Hypothyroidism Osteo-arthritis Colon polyps Pneumonia 08/09 Chronic rhinitis GERD Right broken Femur-12/26/2008  Past Surgical History: Reviewed history from 06/02/2009 and no changes required. Back surgery 1960's AICD 2005- St. Jude Atlas V-340-explanted [employer].  Jude ICD model [phone] pocket revision Skin cancer removal 2007  Vital Signs:  Patient profile:   77 year old female Height:      66 inches (167.64 cm) Weight:      136 pounds (61.82 kg) BMI:     22.03 O2 Sat:      94 % on Room air Temp:     97.6 degrees F (36.44 degrees C) oral Pulse rate:   89 / minute BP sitting:   122 / 74  (left arm) Cuff size:   regular  Vitals Entered By: Michel Bickers CMA (November 05, 2009 4:41 PM)  O2 Sat at Rest %:  94 O2 Flow:  Room air CC: OSA. RLS. The patient says she has not used her CPAP in 1 month due to irritation of her nose from the mask and eye ptoblems. Comments  Medications and daytime phone verifed with the patient. Michel Bickers [facility]  November 05, 2009 4:42 PM   Physical Exam  General:  thin.   Nose:  no sinus tenderness or discharge Mouth:  no deformity or lesions Neck:  no JVD.   Lungs:  Decreased breath sounds, no wheezing or rales Heart:  regular rhythm, normal rate, and no murmurs.   Extremities:  no clubbing, cyanosis, edema, or deformity noted Cervical Nodes:  no significant adenopathy   Impression & Recommendations:  Problem # 1:  BRONCHITIS, CHRONIC (ICD-491.9)  She is to continue spiriva.  Problem # 2:  RHINITIS (ICD-472.0)  She is to continue on her sinus regimen.  Problem # 3:  OBSTRUCTIVE SLEEP APNEA (ICD-327.23)  She has not been able to use her CPAP recently due to eye problems and her dental work.  I have advised her to resume her CPAP use when these issues resolve.  Problem # 4:  RESTLESS LEG SYNDROME (ICD-333.94)  She was intolerant of requip.  This is not too much of a problem at present.  Will monitor clinically.  Complete Medication List: 1)  Spiriva Handihaler 18 Mcg Caps (Tiotropium bromide monohydrate) .... Once daily 2)  Nasonex 50 Mcg/act Susp (Mometasone furoate) .... Two sprays each nostril once daily as needed 3)  Carvedilol 25 Mg Tabs (Carvedilol) .... Take 1/2 tablet in the morning, 1/2 tablet in the evening 4)  Furosemide 40 Mg Tabs (Furosemide) .... Two by mouth qam and one by mouth around 2:00 pm 5)  Aspirin 325 Mg Tabs (Aspirin) .... Take 1 tablet by mouth once a day 6)  Pravachol 40 Mg Tabs (Pravastatin sodium) .... Take 1 tablet by mouth once a day 7)  Tricor 145 Mg Tabs (Fenofibrate) .... Take 1 tablet by mouth once a day 8)  Synthroid 88 Mcg Tabs (Levothyroxine sodium) .[name].. 1 by mouth daily 9)  Zoloft 100 Mg Tabs (Sertraline hcl) .... Once daily 10)  Ferrous Sulfate 325 (65 Fe) Mg Tbec (Ferrous sulfate) .... Take one tablet in the am, one tablet in  the evening 11)  Vitamin C 500 Mg Chew  (Ascorbic acid) .... One by mouth daily 12)  Vitamin D3 2000 Unit Caps (Cholecalciferol) .... Take 1 tablet by mouth once a day 13)  Calcium 500 Mg Tabs (Calcium carbonate) .... Take 1 tablet by mouth two times a day 14)  Klor-con M20 20 Meq Cr-tabs (Potassium chloride crys cr) .... Take 2 tablet by mouth every morning and take 1 tab by mouth at bedtime 15)  Prilosec 20 Mg Cpdr (Omeprazole) .... Take one capsule daily  Other Orders: Est. Patient Level  III [phone]) Prescription Created Electronically [phone])  Patient Instructions: 1)  Follow up in 4 to 6 months Prescriptions: NASONEX 50 MCG/ACT SUSP (MOMETASONE FUROATE) Two sprays each nostril once daily as needed  #1 x 6   Entered and Authorized by:   Coralyn Helling MD   Signed by:   Coralyn Helling MD on 11/05/2009   Method used:   Electronically to        [facility]  [phone]* (retail)       [address] [place], [place]  [address]       Ph: [phone] or [phone]       Fax: [phone]   RxID:   [id number] SPIRIVA HANDIHALER 18 MCG  CAPS (TIOTROPIUM BROMIDE MONOHYDRATE) once daily  #30 x 6   Entered and Authorized by:   Coralyn Helling MD   Signed by:   Coralyn Helling MD on 11/05/2009   Method used:   Electronically to        [facility]  [phone]* (retail)       [address] [place], [place]  [address]       Ph: [phone] or [phone]       Fax: [phone]   RxID:   [id number]

## 2010-06-09 NOTE — Assessment & Plan Note (Signed)
Summary: PAIN UNDER RIB CAGE/ MBW   Copy to:  [name] [name], [name] [name] Primary Provider/Referring Provider:  Guerry Bruin  CC:  Acute visit for LUQ pain/pain under ribs x1 week. The patient denies any sob or cough. [name] [name] says [name] [name] notices the pain more when reaching for things. [name] [name] denies any falls..  History of Present Illness: 77 yo female with OSA, RLS, Chronic bronchitis with history of tobacco abuse, and Chronic rhinitis, ACE cough  [name] [name] is here for an acute visit.    [name] [name] developed left sided chest pain for the past week.  It is worse when [name] [name] moves.  [name] [name] feels like [name] [name] has pulled a muscle.  [name] [name] also has some soreness when [name] [name] takes a deep breath.  It feels more superficial.  [name] [name] has been getting more reflux.  The pain will last several minutes, and then go away.  [name] [name] does not recall any trauma, and denies bruising.  [name] [name] does not feel that her breathing is any worse, and [name] [name] is not coughing much.  [name] [name] has not had fever.  [name] [name] was taken off of requip due to confusional state, and hallucinations.  Her mental status has improved since coming off requip.  Her legs have not bothered her that much, but [name] [name] still gets the jumpy legs at times.  Current Medications (verified): 1)  Spiriva Handihaler 18 Mcg  Caps (Tiotropium Bromide Monohydrate) .... Once Daily 2)  Nasonex 50 Mcg/act Susp (Mometasone Furoate) .... Two Sprays Each Nostril Once Daily As Needed 3)  Carvedilol 25 Mg  Tabs (Carvedilol) .... Take 1/2 Tablet in The Morning, 1/2 Tablet in The Evening 4)  Furosemide 40 Mg  Tabs (Furosemide) .... Two By Mouth Qam and One By Mouth Qpm 5)  Aspirin 325 Mg  Tabs (Aspirin) .... Take 1 Tablet By Mouth Once A Day 6)  Pravachol 40 Mg  Tabs (Pravastatin Sodium) .... Take 1 Tablet By Mouth Once A Day 7)  Tricor 145 Mg  Tabs (Fenofibrate) .... Take 1 Tablet By Mouth Once A Day 8)  Synthroid 88 Mcg Tabs (Levothyroxine Sodium) .[name].. 1 By Mouth Daily 9)  Zoloft 100 Mg Tabs (Sertraline Hcl) .... Once  Daily 10)  Fentanyl 25 Mcg/hr Pt72 (Fentanyl) .... Every 3 Days 11)  Ferrous Sulfate 325 (65 Fe) Mg Tbec (Ferrous Sulfate) .... Take One Tablet in The Am, One Tablet in  The Evening 12)  Digoxin 0.125 Mg Tabs (Digoxin) .... in The Morning 13)  Vitamin C 500 Mg Chew (Ascorbic Acid) .... One By Mouth Daily 14)  Vitamin D3 2000 Unit Caps (Cholecalciferol) .... Take 1 Tablet By Mouth Once A Day 15)  Calcium 500 Mg Tabs (Calcium Carbonate) .... Take 1 Tablet By Mouth Two Times A Day 16)  Klor-Con M20 20 Meq Cr-Tabs (Potassium Chloride Crys Cr) .... Take 2 Tablet By Mouth Every Morning and Take 1 Tab By Mouth At Bedtime  Allergies (verified): No Known Drug Allergies  Past History:  Past Medical History: Paroxysmal atrial fibrillation OSA      - AHI 31.5 from PSG 05/07      - CPAP 11 cm H2O Restless leg syndrome      - Confusional state while on requip ACE induced cough Chronic bronchitis      - PFT 10/12/06 FEV1 1.81(107%), FVC 2.06(83%), FEV1% 88, TLC 4.30(95%), DLCO 56%, no BD      - had suggestion of obstruction on flow/volume curve Chronic back pain Non-ischemic CM Biventricular defibrillator placed 2005-St Jude Atlas v-340-explanted [employer]  St. Jude ICD model [phone] Left Pneumothorax 2005 after defibrillator placement hyperlipidemia Arthritis Depression Subdural hematoma x2  Chronic renal insufficiency Hypothyroidism Osteo-arthritis Colon polyps Pneumonia 08/09 Chronic rhinitis GERD Right broken Femur-12/26/2008  Past Surgical History: Reviewed history from 06/02/2009 and no changes required. Back surgery 1960's AICD 2005- St. Jude Atlas V-340-explanted [employer] St. Jude ICD model [phone] pocket revision Skin cancer removal 2007  Vital Signs:  Patient profile:   77 year old female Height:      66 inches (167.64 cm) Weight:      128 pounds (58.18 kg) BMI:     20.73 O2 Sat:      96 % on Room air Temp:     98.4 degrees F (36.89 degrees C) oral Pulse rate:   90 /  minute BP sitting:   118 / 80  (left arm) Cuff size:   regular  Vitals Entered By: Michel Bickers CMA (July 01, 2009 3:48 PM)  O2 Sat at Rest %:  96 O2 Flow:  Room air  Physical Exam  General:  thin.   Nose:  no sinus tenderness or discharge Mouth:  no deformity or lesions Neck:  no JVD.   Chest Wall:  mild tenderness over lower left costal region Lungs:  Decreased breath sounds, no wheezing or rales Heart:  regular rhythm, normal rate, and no murmurs.   Abdomen:  bowel sounds positive; abdomen soft and non-tender without masses, or organomegaly Extremities:  no clubbing, cyanosis, edema, or deformity noted Cervical Nodes:  no significant adenopathy   Impression & Recommendations:  Problem # 1:  CHEST PAIN (ICD-786.50) My suspicion is that [name] [name] has a musculoskeletal cause for her pain.  I have advised her to try OTC analgesics as needed.  I will also have her get left sided rib films to exclude rib fracture.  [name] [name] does also have worsening reflux symptoms.  I have advised her that [name] [name] could try switching to prilosec since her insurance company will no longer cover for protonix.  I have also advised that [name] [name] could try using OTC antacid to see if [name] [name] gets relief of her chest discomfort.  If [name] [name] is still symptomatic after, then [name] [name] may need further evaluation by cardiology.  However, I am less suspicious that her current symptoms are related to cardiac disease.  Problem # 2:  RESTLESS LEG SYNDROME (ICD-333.94) Will monitor off requip.  Her leg symptoms get worse will need to consider alternate agent besides dopamine agonists due to her confusional state while on requip.  Problem # 3:  OBSTRUCTIVE SLEEP APNEA (ICD-327.23) With weight loss her sleep apnea had improved.  However, her home sleep apnea test did show that her AHI was still elevated.  I have advised her to continue with CPAP since [name] [name] does have symptomatic benefit from this.  Problem # 4:  BRONCHITIS, CHRONIC  (ICD-491.9) [name] [name] is to continue with spiriva.  Medications Added to Medication List This Visit: 1)  Nasonex 50 Mcg/act Susp (Mometasone furoate) .... Two sprays each nostril once daily as needed  Complete Medication List: 1)  Spiriva Handihaler 18 Mcg Caps (Tiotropium bromide monohydrate) .... Once daily 2)  Nasonex 50 Mcg/act Susp (Mometasone furoate) .... Two sprays each nostril once daily as needed 3)  Carvedilol 25 Mg Tabs (Carvedilol) .... Take 1/2 tablet in the morning, 1/2 tablet in the evening 4)  Furosemide 40 Mg Tabs (Furosemide) .... Two by mouth qam and one by mouth qpm 5)  Aspirin 325 Mg Tabs (Aspirin) .... Take 1  tablet by mouth once a day 6)  Pravachol 40 Mg Tabs (Pravastatin sodium) .... Take 1 tablet by mouth once a day 7)  Tricor 145 Mg Tabs (Fenofibrate) .... Take 1 tablet by mouth once a day 8)  Synthroid 88 Mcg Tabs (Levothyroxine sodium) .[name].. 1 by mouth daily 9)  Zoloft 100 Mg Tabs (Sertraline hcl) .... Once daily 10)  Fentanyl 25 Mcg/hr Pt72 (Fentanyl) .... Every 3 days 11)  Ferrous Sulfate 325 (65 Fe) Mg Tbec (Ferrous sulfate) .... Take one tablet in the am, one tablet in  the evening 12)  Digoxin 0.125 Mg Tabs (Digoxin) .... In the morning 13)  Vitamin C 500 Mg Chew (Ascorbic acid) .... One by mouth daily 14)  Vitamin D3 2000 Unit Caps (Cholecalciferol) .... Take 1 tablet by mouth once a day 15)  Calcium 500 Mg Tabs (Calcium carbonate) .... Take 1 tablet by mouth two times a day 16)  Klor-con M20 20 Meq Cr-tabs (Potassium chloride crys cr) .... Take 2 tablet by mouth every morning and take 1 tab by mouth at bedtime  Other Orders: Est. Patient Level III (09811) T-Ribs Unilateral 2 Views (71100TC)  Patient Instructions: 1)  Try using prilosec instead of protonix 2)  Try using maalox when you have stomach discomfort 3)  Xray today 4)  Follow up in 4 months   Immunization History:  Pneumovax Immunization History:    Pneumovax:  historical (02/07/2009)

## 2010-06-09 NOTE — Cardiovascular Report (Signed)
Summary: Office Visit   Office Visit   Imported By: Roderic Ovens 07/31/2009 10:13:55  _____________________________________________________________________  External Attachment:    Type:   Image     Comment:   External Document

## 2010-06-11 NOTE — Letter (Signed)
Summary: Remote Device Check  [facility], Main Office  [address]   [place], [place] [address]   Phone: [phone]  Fax: [phone]     May 20, 2010 MRN: [id number]   [facility] [address] [place], [place]  [address]   Dear Ms. Hechavarria,   Your remote transmission was recieved and reviewed by your physician.  All diagnostics were within normal limits for you.  _____Your next transmission is scheduled for:                       .  Please transmit at any time this day.  If you have a wireless device your transmission will be sent automatically.  ______Your next office visit is scheduled for:                              . Please call our office to schedule an appointment.    Sincerely,  Vella Kohler

## 2010-06-11 NOTE — Cardiovascular Report (Signed)
Summary: Office Visit Remote   Office Visit Remote   Imported By: Roderic Ovens 05/22/2010 11:04:33  _____________________________________________________________________  External Attachment:    Type:   Image     Comment:   External Document

## 2010-06-11 NOTE — Letter (Signed)
Summary: Remote Device Check  [facility], Main Office  [address]   [place], [place] [address]   Phone: [phone]  Fax: [phone]     May 20, 2010 MRN: [id number]   [facility] [address] [place], [place]  [address]   Dear Ms. Enriques,   Your remote transmission was recieved and reviewed by your physician.  All diagnostics were within normal limits for you.   __X____Your next office visit is scheduled for:  March 2012 with Dr Graciela Husbands. Please call our office to schedule an appointment.    Sincerely,  Vella Kohler

## 2010-08-12 LAB — POCT I-STAT, CHEM 8
Chloride: 100 mEq/L (ref 96–112)
HCT: 32 % — ABNORMAL LOW (ref 36.0–46.0)
Hemoglobin: 10.9 g/dL — ABNORMAL LOW (ref 12.0–15.0)
Potassium: 3.3 mEq/L — ABNORMAL LOW (ref 3.5–5.1)
Sodium: 142 mEq/L (ref 135–145)

## 2010-08-15 LAB — BASIC METABOLIC PANEL
BUN: 17 mg/dL (ref 6–23)
BUN: 18 mg/dL (ref 6–23)
BUN: 19 mg/dL (ref 6–23)
BUN: 23 mg/dL (ref 6–23)
CO2: 28 mEq/L (ref 19–32)
CO2: 28 mEq/L (ref 19–32)
Calcium: 8.2 mg/dL — ABNORMAL LOW (ref 8.4–10.5)
Chloride: 102 mEq/L (ref 96–112)
Chloride: 106 mEq/L (ref 96–112)
Chloride: 107 mEq/L (ref 96–112)
Chloride: 110 mEq/L (ref 96–112)
Creatinine, Ser: 1.2 mg/dL (ref 0.4–1.2)
Creatinine, Ser: 1.28 mg/dL — ABNORMAL HIGH (ref 0.4–1.2)
Creatinine, Ser: 1.42 mg/dL — ABNORMAL HIGH (ref 0.4–1.2)
Creatinine, Ser: 1.49 mg/dL — ABNORMAL HIGH (ref 0.4–1.2)
Creatinine, Ser: 1.77 mg/dL — ABNORMAL HIGH (ref 0.4–1.2)
GFR calc Af Amer: 33 mL/min — ABNORMAL LOW (ref >60)
GFR calc non Af Amer: 27 mL/min — ABNORMAL LOW (ref >60)
GFR calc non Af Amer: 43 mL/min — ABNORMAL LOW (ref >60)
Glucose, Bld: 107 mg/dL — ABNORMAL HIGH (ref 70–99)
Glucose, Bld: 111 mg/dL — ABNORMAL HIGH (ref 70–99)
Glucose, Bld: 114 mg/dL — ABNORMAL HIGH (ref 70–99)
Glucose, Bld: 136 mg/dL — ABNORMAL HIGH (ref 70–99)
Potassium: 3 mEq/L — ABNORMAL LOW (ref 3.5–5.1)
Potassium: 3.6 mEq/L (ref 3.5–5.1)
Potassium: 3.6 mEq/L (ref 3.5–5.1)
Potassium: 3.9 mEq/L (ref 3.5–5.1)

## 2010-08-15 LAB — PROTIME-INR
INR: 1.1 (ref 0.00–1.49)
INR: 1.2 (ref 0.00–1.49)
INR: 2.4 — ABNORMAL HIGH (ref 0.00–1.49)
INR: 3.3 — ABNORMAL HIGH (ref 0.00–1.49)
INR: 4.3 — ABNORMAL HIGH (ref 0.00–1.49)
Prothrombin Time: 20.4 seconds — ABNORMAL HIGH (ref 11.6–15.2)
Prothrombin Time: 33 seconds — ABNORMAL HIGH (ref 11.6–15.2)

## 2010-08-15 LAB — CBC
HCT: 18.7 % — ABNORMAL LOW (ref 36.0–46.0)
HCT: 23.3 % — ABNORMAL LOW (ref 36.0–46.0)
HCT: 23.9 % — ABNORMAL LOW (ref 36.0–46.0)
HCT: 25.5 % — ABNORMAL LOW (ref 36.0–46.0)
HCT: 28.7 % — ABNORMAL LOW (ref 36.0–46.0)
MCHC: 34.4 g/dL (ref 30.0–36.0)
MCHC: 35 g/dL (ref 30.0–36.0)
MCHC: 35 g/dL (ref 30.0–36.0)
MCV: 90.7 fL (ref 78.0–100.0)
MCV: 90.8 fL (ref 78.0–100.0)
MCV: 92.1 fL (ref 78.0–100.0)
MCV: 92.3 fL (ref 78.0–100.0)
MCV: 92.5 fL (ref 78.0–100.0)
Platelets: 123 10*3/uL — ABNORMAL LOW (ref 150–400)
Platelets: 130 10*3/uL — ABNORMAL LOW (ref 150–400)
Platelets: 144 10*3/uL — ABNORMAL LOW (ref 150–400)
Platelets: 157 10*3/uL (ref 150–400)
Platelets: 169 10*3/uL (ref 150–400)
RBC: 3.11 MIL/uL — ABNORMAL LOW (ref 3.87–5.11)
RDW: 15.5 % (ref 11.5–15.5)
RDW: 16.8 % — ABNORMAL HIGH (ref 11.5–15.5)
RDW: 17 % — ABNORMAL HIGH (ref 11.5–15.5)
WBC: 4.6 10*3/uL (ref 4.0–10.5)
WBC: 4.9 10*3/uL (ref 4.0–10.5)
WBC: 5.5 10*3/uL (ref 4.0–10.5)
WBC: 5.9 10*3/uL (ref 4.0–10.5)

## 2010-08-15 LAB — TYPE AND SCREEN
ABO/RH(D): O NEG
Antibody Screen: NEGATIVE

## 2010-08-15 LAB — URINE CULTURE

## 2010-08-15 LAB — URINALYSIS, ROUTINE W REFLEX MICROSCOPIC
Hgb urine dipstick: NEGATIVE
Nitrite: NEGATIVE
Protein, ur: NEGATIVE mg/dL
Specific Gravity, Urine: 1.009 (ref 1.005–1.030)
Urobilinogen, UA: 0.2 mg/dL (ref 0.0–1.0)

## 2010-08-15 LAB — HEMOGLOBIN AND HEMATOCRIT, BLOOD
HCT: 25.9 % — ABNORMAL LOW (ref 36.0–46.0)
HCT: 30.6 % — ABNORMAL LOW (ref 36.0–46.0)
Hemoglobin: 10.4 g/dL — ABNORMAL LOW (ref 12.0–15.0)
Hemoglobin: 8.8 g/dL — ABNORMAL LOW (ref 12.0–15.0)
Hemoglobin: 9.6 g/dL — ABNORMAL LOW (ref 12.0–15.0)

## 2010-08-15 LAB — COMPREHENSIVE METABOLIC PANEL
BUN: 16 mg/dL (ref 6–23)
CO2: 28 mEq/L (ref 19–32)
Calcium: 8 mg/dL — ABNORMAL LOW (ref 8.4–10.5)
Chloride: 103 mEq/L (ref 96–112)
Creatinine, Ser: 1.25 mg/dL — ABNORMAL HIGH (ref 0.4–1.2)
GFR calc non Af Amer: 41 mL/min — ABNORMAL LOW (ref >60)
Total Bilirubin: 1.3 mg/dL — ABNORMAL HIGH (ref 0.3–1.2)

## 2010-08-15 LAB — TSH: TSH: 2.699 u[IU]/mL (ref 0.350–4.500)

## 2010-08-15 LAB — DIFFERENTIAL
Eosinophils Absolute: 0.1 10*3/uL (ref 0.0–0.7)
Lymphocytes Relative: 7 % — ABNORMAL LOW (ref 12–46)
Lymphs Abs: 0.4 10*3/uL — ABNORMAL LOW (ref 0.7–4.0)
Monocytes Relative: 5 % (ref 3–12)
Neutrophils Relative %: 87 % — ABNORMAL HIGH (ref 43–77)

## 2010-08-15 LAB — PREPARE RBC (CROSSMATCH)

## 2010-09-11 ENCOUNTER — Encounter: Payer: Self-pay | Admitting: Internal Medicine

## 2010-09-15 ENCOUNTER — Ambulatory Visit (INDEPENDENT_AMBULATORY_CARE_PROVIDER_SITE_OTHER): Payer: Medicare Other | Admitting: Internal Medicine

## 2010-09-15 ENCOUNTER — Encounter: Payer: Self-pay | Admitting: Internal Medicine

## 2010-09-15 DIAGNOSIS — I5022 Chronic systolic (congestive) heart failure: Secondary | ICD-10-CM

## 2010-09-15 DIAGNOSIS — I499 Cardiac arrhythmia, unspecified: Secondary | ICD-10-CM

## 2010-09-15 DIAGNOSIS — I5032 Chronic diastolic (congestive) heart failure: Secondary | ICD-10-CM

## 2010-09-15 DIAGNOSIS — Z95810 Presence of automatic (implantable) cardiac defibrillator: Secondary | ICD-10-CM | POA: Insufficient documentation

## 2010-09-15 DIAGNOSIS — I4891 Unspecified atrial fibrillation: Secondary | ICD-10-CM

## 2010-09-15 DIAGNOSIS — I509 Heart failure, unspecified: Secondary | ICD-10-CM

## 2010-09-15 DIAGNOSIS — T82198A Other mechanical complication of other cardiac electronic device, initial encounter: Secondary | ICD-10-CM

## 2010-09-15 NOTE — Progress Notes (Signed)
HPI  [name] [name] is a 77 y.o. female seen in followup for nonischemic cardiomyopathy with congestive heart failure for which she underwent CRT-D implantation in 2005 with device generator replacement in Dec 2010; Following  generator replacement  she has noted diaphragmatic stimulation prompting inactivation of LV pacing.  This had not been noticed with the prior device. Assessment of left ventricular function by echo August 2011 had demonstrated late normalization of left ventricular function  She has recently moved into a retirement facility because of her and her husband's care needs. This has been a challenge but not overwhelming. Functional status has been stable. The patient denies chest pain, shortness of breath, nocturnal dyspnea, orthopnea or peripheral edema.  There have been no palpitations, lightheadedness or syncope.      Past Medical History  Diagnosis Date  . Atrial fibrillation   . BRONCHITIS, CHRONIC   . CHEST PAIN   . HYPOKALEMIA   . OBSTRUCTIVE SLEEP APNEA   . Palpitations   . SYSTOLIC HEART FAILURE, CHRONIC   . Depression   . CPAP (continuous positive airway pressure) dependence   . ACE-inhibitor cough   . Cardiac defibrillator in situ   . Chronic renal insufficiency   . GERD (gastroesophageal reflux disease)   . Hypothyroidism   . Hematuria   . Osteoarthritis (arthritis due to wear and tear of joints)   . Colon polyps   . Chronic rhinitis     Past Surgical History  Procedure Date  . Back surgery   . Cardiac defibrillator placement 2005     ST. JUDE Model [phone]  . Skin cancer excision     Current Outpatient Prescriptions  Medication Sig Dispense Refill  . acetaminophen (TYLENOL) 500 MG tablet Take 500 mg by mouth daily as needed.        [name] allopurinol (ZYLOPRIM) 100 MG tablet Take 100 mg by mouth daily.        . Ascorbic Acid (VITAMIN C) 500 MG tablet Take 500 mg by mouth daily.        [name] aspirin 325 MG tablet Take 325 mg by mouth daily.         . calcium gluconate 500 MG tablet Take 500 mg by mouth 2 (two) times daily.        . carvedilol (COREG) 25 MG tablet Take by mouth. 1/2 tablet  Tablet  Twice daily.       . ergocalciferol (VITAMIN D2) 50000 UNITS capsule Take 50,000 Units by mouth once a week.        . fenofibrate (TRICOR) 48 MG tablet Take 48 mg by mouth daily.        . ferrous sulfate 325 (65 FE) MG tablet Take 325 mg by mouth as directed.        . furosemide (LASIX) 40 MG tablet Take 40 mg by mouth 2 (two) times daily.        [name] HYDROcodone-acetaminophen (VICODIN) 5-500 MG per tablet Take 1 tablet by mouth daily as needed.        [name] levothyroxine (SYNTHROID, LEVOTHROID) 88 MCG tablet Take 88 mcg by mouth daily.        . OxyCODONE HCl, Abuse Deter, 5 MG TABS Take 1 tablet by mouth as needed.        . pantoprazole (PROTONIX) 40 MG tablet Take 40 mg by mouth daily.        . potassium chloride SA (K-DUR,KLOR-CON) 20 MEQ tablet Take 20 mEq by mouth 2 (two) times daily.       [name]  pravastatin (PRAVACHOL) 40 MG tablet Take 40 mg by mouth daily.        . sertraline (ZOLOFT) 100 MG tablet Take 100 mg by mouth daily.        [name] tiotropium (SPIRIVA) 18 MCG inhalation capsule Place 18 mcg into inhaler and inhale daily.        [name] DISCONTD: Cholecalciferol (VITAMIN D3) 2000 UNITS capsule Take 2,000 Units by mouth daily.        [name] DISCONTD: fenofibrate (TRICOR) 145 MG tablet Take 145 mg by mouth daily.        [name] DISCONTD: furosemide (LASIX) 40 MG tablet Take by mouth. Take 1 tablet in the morning and 1 tablet in the evening.       [name] DISCONTD: mometasone (NASONEX) 50 MCG/ACT nasal spray 2 sprays by Nasal route as directed.        [name] DISCONTD: omeprazole (PRILOSEC) 20 MG capsule Take 20 mg by mouth daily.          Allergies not on file  Review of Systems negative except from HPI and PMH  Physical Exam Well developed and well nourished in no acute distress HENT normal E scleral and icterus clear Neck Supple JVP flat; carotids brisk and  full Clear to ausculation Regular rate and rhythm, no murmurs gallops or rub Soft with active bowel sounds No clubbing cyanosis and edema Alert and oriented, grossly normal motor and sensory function Skin Warm and Dry   Assessment and  Plan

## 2010-09-15 NOTE — Assessment & Plan Note (Signed)
No change 

## 2010-09-15 NOTE — Assessment & Plan Note (Signed)
intermittentt atrial fibrillation

## 2010-09-15 NOTE — Assessment & Plan Note (Signed)
Stable; it has been normalization of LV systolic

## 2010-09-15 NOTE — Assessment & Plan Note (Signed)
She is infrequently paced; no intercurrent therapies. The patient's device was interrogated.  The information was reviewed. No changes were made in the programming.

## 2010-09-22 NOTE — Consult Note (Signed)
NAME:  [name] [name] [name], [name] [name]         ACCOUNT NO.:  [id number]   MEDICAL RECORD NO.:  [id number]          PATIENT TYPE:  EMS   LOCATION:  MAJO                         FACILITY:  [facility]   PHYSICIAN:  [name] [name] [name] [name] [name], M.D.DATE OF BIRTH:  11/29/1924   DATE OF CONSULTATION:  12/26/2008  DATE OF DISCHARGE:                                 CONSULTATION   REQUESTING PHYSICIAN:  Jene Every, MD   CHIEF COMPLAINT:  Hip fracture, medical management.   HISTORY OF PRESENT ILLNESS:  The patient is well known to me, 77-year-  old white female, who has had several falls in the past month leading to  discontinuation of her Coumadin despite her severe congestive heart  failure and her implanted defibrillator.  She has had 2 subdurals, both  of which have resolved including one that was seen yesterday on CAT scan  at [facility], which appeared to be resolved as well.  At her last  office visit, we had a long discussion with the patient's family  regarding safety of being at home and they were in the process of trying  to get the patient and her husband placed at assisted living, [facility]  [place], despite all her medical illnesses, has been the primary  caretaker of her husband.  She, however, had a fall this morning while  reaching for the refrigerator door, which was slightly too far away from  her.  She fell down next to her walker in a turning fashion and had  immediate right hip pain, and was taken to the emergency room where she  was found to have a right intertrochanteric and subtrochanteric hip  fracture.  I was asked to see the patient for her chronic medical  issues.   On arrival, the patient was supine, lying in bed with her daughter next  to her, discussed the difficulties of getting Deina through a surgical  intervention given her congestive heart failure, and I indicated that I  also spoken with [name], who has agreed to see the patient in  cardiac consultation as well.  She  has not yet been seen by orthopedist.  Dr. Shelle Iron has been called for admission, indicates mostly that she is  having pain, and her daughter was able to get the above story.   PAST MEDICAL HISTORY:  1. Atrial fibrillation, currently in normal sinus rhythm.  2. Congestive heart failure with an implantable defibrillator, which      was performed in 2005.  3. Chronic kidney disease, stage III, usually runs a creatinine      slightly below 2.0.  4. Hypothyroidism.  5. Osteoarthritis.  6. History of frequent falls, recent.  7. Restless legs syndrome.  8. DJD of spine.  9. History of colon polyps.   PAST SURGICAL HISTORY:  AICD in September 2005, skin cancer removal in  September 2007, and back surgical history in 1960s.   ALLERGIES:  No known drug allergies.   MEDICATIONS:  1. Aspirin 81 mg per day.  2. Calcium with vitamin D twice daily.  3. Fentanyl patch 25 mcg q.3 days.  4. Lasix 40 mg t.i.d.  5. Pravastatin 40  mg daily.  6. Protonix 40 mg daily.  7. ReQuip 2 mg nightly.  8. Synthroid 75 mcg per day.  9. TriCor 145 mg per day.  10.Zoloft 50 mg per day.  11.Vitamin C 500 mg per day.   SOCIAL HISTORY:  The patient lives in [place] with her husband as his  primary caretaker.  She is retired, married, with 2 adopted children.  She has a 10 pack-year smoking history and quit in 2004.  Used to be a  social drinker, but does not drink currently.   FAMILY HISTORY:  Mother died at age 37 of colon cancer.  Father died at  age 74 with an MI.  She has 2 siblings.   REVIEW OF SYSTEMS:  Unobtainable except for pain at her right hip at  stretches.  The patient remains full code.   PHYSICAL EXAMINATION:  VITAL SIGNS:  Blood pressure 116/72, pulse rate  86, respiratory rate 18, and temperature 98.0.  GENERAL:  The patient appears to be in pain, but has received some pain  relief from initially in the emergency room.  HEENT:  The patient has bruising of her left orbital area, which  appears  to be approximately 46 days old.  As noted that she had had head trauma  and a fall recently with her CT of her head cleared yesterday at [facility].  ENT is otherwise within normal limits.  NECK:  Supple.  No lymphadenopathy or JVD.  HEART:  The patient has a grade 2/6 systolic ejection murmur.  Regular  rate.  LUNGS:  Clear to auscultation bilaterally.  ABDOMEN:.  Soft, nontender, and normoactive bowel sounds.  EXTREMITIES:  She has a foreshortened right leg consistent with her  fracture.  MUSCULOSKELETAL:  No other deformities or CVA tenderness noted.  NEUROLOGIC:  The patient is oriented to person, place, and time, but  having pain, is not willing to participate in full neurologic exam.  She  does not have any lateralizing signs.   Chest x-ray shows implantable defibrillator and is nonacute.  X-rays of  her hips show a right hip intertrochanteric fracture.  CT of her head  was negative for any acute bleed or hemorrhage.   LABORATORIES:  White count 5.9, hemoglobin 9.6, hematocrit 28.7, and  platelets 226.  BUN and creatinine 31 and 1.7 respectively.  PT 14.2 and  INR 1.1.  Urinalysis negative.   ASSESSMENT AND PLAN:  1. Right trochanteric hip fracture.  The patient will be seen by Dr.      Shelle Iron later today for formal admission and for planning of surgery.  2. Congestive heart failure.  She currently seems to be euvolemic.  I      will not make any changes in this and we will check a BNP in the      morning.  She is normally on Lasix 40 mg t.i.d., and we will have      her IV fluids running KVO only as to not fluid overload her.  She      will be seen by Dr. Patty Sermons later today as he had been called for      consultation as well.  3. Defibrillator, currently in place.  As above.  4. Chronic kidney disease, stage III.  She is currently at her      baseline.  5. Restless legs syndrome, ReQuip in the evenings.  6. Hypothyroidism.  We will check a TSH, confirm her  dose.  7. History of atrial  fibrillation and chronic anticoagulation.  This      has been discontinued in the past couple of weeks, and her INR is      back down to normal, it is no longer an issue.  8. Hyperlipidemia.  Continue her on TriCor 145 mg a day.  It is not      quite need be lower renal dose at this point based on her prior      clearances.  9. Lovenox for deep vein thrombosis prophylaxis.  Protonix for      gastrointestinal prophylaxis.  10.We will continue to follow medically while she is hospitalized.      Goal will be placement in SNF.  The patient's family has already      been informed of this and we will make arrangements with the social      worker as needed as her husband made prior placement as well.      [name] [name] [name] [name] [name], M.D.  Electronically Signed     RWT/MEDQ  D:  12/26/2008  T:  12/27/2008  Job:  [id number]   cc:   Jene Every, M.D.  [name], M.D.

## 2010-09-22 NOTE — Consult Note (Signed)
NAME:  [name] [name], [name] [name]         ACCOUNT NO.:  [id number]   MEDICAL RECORD NO.:  [id number]          PATIENT TYPE:  INP   LOCATION:  1436                         FACILITY:  [facility]   PHYSICIAN:  [name], M.D. DATE OF BIRTH:  01-22-1925   DATE OF CONSULTATION:  12/26/2008  DATE OF DISCHARGE:                                 CONSULTATION   CHIEF COMPLAINT:  Acute fractured right hip.   HISTORY:  This is an 77 year old Caucasian female followed by Dr.  Wylene Simmer for medical issues and followed in our office for cardiac  issues.  Dr. Reyes Ivan follows her for her defibrillator.  She has a  history of a nonischemic cardiomyopathy.  We are asked by Dr. Wylene Simmer to  see her to help with preoperative clearance prior to anticipated surgery  on her right hip.  The patient does have a [employer] ICD, which has been  placed since September 2005.  It is nearing the end of its battery life.  The patient has had 3 recent falls where she has lost her balance.  She  has not had dizziness or syncope from a cardiac standpoint.  Today on  the third fall, she fell and broke her right hip.  She will need a  surgery urgently.  She denies any recent chest pain or exacerbation of  symptoms of CHF.   HOME MEDICATIONS:  1. Carvedilol 12.5 mg b.i.d.  2. Pravastatin 40 mg daily.  3. Protonix 40 mg daily.  4. Ferrous sulfate 325 twice a day.  5. Vitamin C daily.  6. Synthroid 88 mcg daily.  7. She is now off Coumadin for the past week.  8. Sertraline 50 mg daily.  9. ReQuip 2-3 times a day.  10.TriCor 145 mg daily.  11.Fentanyl patch 25 mcg per hour every 72 hours.  12.Vitamin E 400 units daily.  13.Aspirin 81 mg daily.  14.Centrum Silver 1 daily.  15.Vitamin D 2000 units daily.  16.Spiriva 1 daily.  17.Lasix 40 mg taking 2 in the morning, 1 at night.  18.Digoxin 0.125 mg daily.    Past medical history reveals that:  1. She has a dilated cardiomyopathy.  2. She has a history of an ejection fraction  of 15-20% by      echocardiogram in 2005.  More recently, her ejection fraction is      increased to 50-55% on medical therapy.   FAMILY HISTORY:  Father died at 65 of heart problems.  Mother died of  colon cancer.   Social history reveals that she is married.  She has a daughter, who is  very supportive.  Her husband is invalid.   All other review of systems is negative in detail.   On exam, her weight is not recorded.  The blood pressure is 114/68,  pulse is 70 and paced.  The general appearance reveals that she has  multiple bruises on her face from previous and recent falls and she does  have a history of recent subdural from one of her falls.  The chest is  clear to auscultation.  The heart reveals no gallop.  The abdomen is  soft  and nontender.  The extremities show that the right leg is  foreshortened and externally rotated.  She has excellent pedal pulses.   Her chest x-ray shows mild cardiomegaly.  Her hemoglobin was 9.6,  hematocrit 28.  INR 1.1.  Sodium 142, potassium 3.6, BUN 31, creatinine  1.77.  Blood sugar 137.   IMPRESSION:  1. Nonischemic cardiomyopathy.  2. Functioning implantable cardioverter-defibrillator.  3. Compensated congestive heart failure.  4. Anemia.  5. Acute fracture of left hip.  6. Multiple recent falls secondary to poor balance and overall      weakness and debility.   DISPOSITION:  I have contacted [employer] and they will be deactivating  her defibrillator prior to surgery and they will reactivate it postop.  Okay to proceed with surgery from the cardiac standpoint when Dr.  Ermelinda Das OR schedule permits.           ______________________________  [name], M.D.     TB/MEDQ  D:  12/26/2008  T:  12/27/2008  Job:  [id number]   cc:   Jene Every, M.D.  Gaspar Garbe, M.D.  Elmore Guise., M.D.

## 2010-09-22 NOTE — Op Note (Signed)
NAME:  [name], [name]         ACCOUNT NO.:  [id number]   MEDICAL RECORD NO.:  [id number]          PATIENT TYPE:  INP   LOCATION:  1436                         FACILITY:  [facility]   PHYSICIAN:  Jene Every, M.D.    DATE OF BIRTH:  [birth year]   DATE OF PROCEDURE:  DATE OF DISCHARGE:                               OPERATIVE REPORT   PREOPERATIVE DIAGNOSIS:  Right femur trochanteric hip fracture.   POSTOPERATIVE DIAGNOSIS:  Right femur trochanteric hip fracture.   PROCEDURE PERFORMED:  1. Right IM nail of right femur.  2. Open reduction, internal fixation of the lesser trochanter with a      Dall-Miles cable.  Also, the degree of difficulty was enhanced due      to the comminution of the fracture and the need for a further open      incision.   ANESTHESIA:  General.   ASSISTANT:  None.   BRIEF HISTORY:  Comminuted peritrochanteric, subtroch fracture was  indicated for intramedullary rodding.  The risks and benefits discussed,  including bleeding, infection, damage to neurovascular structures,  nonunion, malunion and need for revision, etc.   TECHNIQUE:  The patient in supine position.  After induction of adequate  general anesthesia, 1 gram of Kefzol preoperative cardiac clearance, she  was placed prone on the fracture table.  Bony prominences were well-  padded, peroneal post padded.  Right lower extremity gentle reduction  maneuver was performed.  Right lower extremity in longitudinal traction,  well leg gently flexed and abducted.  Under x-ray, we reduced the  fracture best possible by closed means.  Initially on the radiograph and  the AP, it was uncertain as to whether there was a femoral neck  fracture, however, we deemed it rotational as in the lateral was no  evidence of fracture.  Therefore, we proceeded due to the severe  comminution of the fracture.  This we felt required an open lateral  incision and reduction of the fracture prior to intramedullary nailing.  I  therefore prepped and draped the right leg and lower extremity in the  usual sterile fashion.  I made an incision just proximal to the  trochanter, distal to the trochanter at the metaphyseal-diaphyseal  flare.  Subcutaneous tissue was dissected.  Electrocautery was utilized  to achieve hemostasis.  The fascia lata identified and divided in line  with the skin incision.  A fair amount of oozing.  We used  electrocautery to achieve strict hemostasis.  There was fracture  hematoma evacuated as well.  Due to the ongoing loss, I felt it was  appropriate to initiate blood transfusion due to cardiac history and  ongoing loss.  Again, we achieved throughout meticulous hemostasis.  The  tip of the trochanter was identified after I reduced the fracture by  placing a wire passer around the fracture fragments.  After I placed  longitudinal traction, I digitally protected the soft tissues  anteriorly, medially and posteriorly and had the suture passer beneath  my digit at all times.  We just looped the bone.  We used a wire twister  to reduce this portion.  This brought some  of the fragments in the  subtrochanteric region together.  We then tipped the trochanter into the  canal with a guidepin and then reamed it proximally and placed a  guidepin across the fracture site to the distal femur, measured to at a  38.  Inserted a troch nail entry rod 38 after exchanging the guidewire  without difficulty.  We had first reamed it to a 12.5 to insert an 11.  This reduced the fracture and was near anatomic.  We then placed a  compression screw up into the hip.  After the appropriate insertion of  the guidepin in the center of femoral head and neck to the subchondral  bone, reamed it, measured it to a 95, tapped it and then we inserted a  lag screw and compressed it somewhat which reduced the fracture quite  nicely.  Next, distal locking screws were placed with C-arm assist and  the radiolucent guide, made small  stab incision and bluntly dissected  the femur distally, drilled it to the distal locking screws and placed a  42 and a 50 with excellent purchase bicortically in the AP and lateral  plane.  Prior to the insertion of the lag screw and compression of the  lag screw, we released traction on that.  The initial wire that was  advanced around the bone had broken, therefore used a Dall-Miles in a  similar fashion, threaded it around the bone and compressed it and  crimped the collar and it was anatomic in the AP and lateral plane.  The  wound copiously irrigated.  Electrocautery again utilized to achieve  hemostasis.  All __________near anatomic.  We reduced the lesser troch  as well.  Next, the fascia lata was repaired with #1 Vicryl interrupted  figure-of-eight sutures, subcu with 0 and 2-0 Vicryl simple sutures.  The skin was reapproximated with staples.  The wound was dressed  sterilely.  She was removed from the fracture table.  Leg lengths  equivalent, good pulses, extubated without difficulty and transported to  the recovery room in satisfactory condition.   The patient tolerated the procedure well with no complications.  Blood  loss 250 mL.  She received 2 units of packed cells.      Jene Every, M.D.  Electronically Signed     JB/MEDQ  D:  12/26/2008  T:  12/27/2008  Job:  [id number]

## 2010-09-22 NOTE — Assessment & Plan Note (Signed)
[facility]                             PULMONARY OFFICE NOTE   NAME:Dottavio, ARLEAN THIES                MRN:          [id number]  DATE:10/12/2006                            DOB:          01/15/1925    I met Ms. Axelson in followup today for her dyspnea, obstructive  sleep apnea, and restless leg syndrome.   She says that since starting the Spiriva her symptoms of dyspnea have  improved quite a bit to the point where she is able to do some gardening  now.   She had undergone pulmonary function tests earlier in my office today  and this showed a post bronchodilator FEV1/FEC ratio of 88%. Her FEV1  was 1.81 liters which was 107% of predicted. Her FEC was 2.06 which was  83% of predicted. There is no significant bronchodilator response. On  review of the flow volume loop, she did appear to have a jagged shape to  the flat  section of the expiratory loop as well as some scooping of the  expiratory loop. Her total lung capacity was 4.30 liters which was 95%  of predicted. Her diffusion capacity was 56% of predicted but corrected  for lung volumes it was 103% of predicted.   With regards to her sleep apnea, she appears to be doing reasonably  well. She says that she is only having occasional dryness in her mouth  but otherwise is not having any difficulty with this. With regards to  her restless leg syndrome, she again is using her Requip at 2 mg at 2:30  p.m. and again at 6:30 p.m. and this seems to have stabilized her  symptoms although she is still having some problems sporadically. Her  medication list was reviewed.   PHYSICAL EXAMINATION:  GENERAL:  She is 160 pounds, temperature is 97.6,  blood pressure is 108/60. Heart rate is 67, oxygen saturation 97% on  room air.  HEENT:  There was no sinus tenderness, no oral lesions, no  lymphadenopathy.  HEART:  S1, S2.  CHEST:  Clear to auscultation.  ABDOMEN:  Soft, nontender.  EXTREMITIES:  No  edema.   IMPRESSION:  1. Obstructive sleep apnea. I would continue her on her current      settings for her CPAP machine. I do not think that she needs to be      given a chin strap at the present time as her problems with mouth      dryness are only sporadic.  2. Restless leg syndrome. She is to continue on her Requip at 2 mg      b.i.d. I have advised her that she could also take an additional      dose as needed for breakthrough symptoms. I have also advised her      to monitor for the frequency of her symptoms as well as to monitor      if her symptoms are including any other parts of her body. If this      is the case she may be developing augmentation in which case we may      need to switch her  to an alternative regimen for her restless leg      syndrome.  3. Dyspnea. While she did not have definite air flow obstruction on      her spirometry, she did appear to have some degree of air flow      obstruction based on review of her flow volume loop. That in      conjunction with the symptomatic improvement with the use of      Spiriva, I have recommended that she continue on this inhaler.  4. Decrease the diffusion capacity. This corrects for lung volumes. I      suspect this is likely related to her underlying cardiac disease.   I will followup with her in approximately 3-4 months.     Coralyn Helling, MD  Electronically Signed    VS/MedQ  DD: 10/12/2006  DT: 10/12/2006  Job #: [id number]   cc:   Gaspar Garbe, M.D.

## 2010-09-22 NOTE — Discharge Summary (Signed)
NAME:  [name] [name], [name] [name]         ACCOUNT NO.:  [id number]   MEDICAL RECORD NO.:  [id number]          PATIENT TYPE:  INP   LOCATION:  1436                         FACILITY:  [facility]   PHYSICIAN:  Jene Every, M.D.    DATE OF BIRTH:  31-Oct-1924   DATE OF ADMISSION:  12/26/2008  DATE OF DISCHARGE:  01/01/2009                               DISCHARGE SUMMARY   ADMISSION DIAGNOSES:  1. Comminuted right intertrochanteric and subtrochanteric right hip      fracture.  2. Atrial fibrillation.  3. History of congestive heart failure with implantable defibrillator.  4. Chronic kidney disease.  5. Hyperthyroidism.  6. Osteoarthritis.  7. History of frequent falls.  8. Restless legs syndrome.  9. Degenerative joint disease of the spine.  10.History of colon polyps.   DISCHARGE DIAGNOSIS:  1. Comminuted right intertrochanteric and subtrochanteric right hip      fracture.  2. Atrial fibrillation.  3. History of congestive heart failure with implantable defibrillator.  4. Chronic kidney disease.  5. Hyperthyroidism.  6. Osteoarthritis.  7. History of frequent falls.  8. Restless legs syndrome.  9. Degenerative joint disease of the spine.  10.History of colon polyps.  11.Status post IM nailing of the right hip.  12.Resolved postoperative anemia.  13.Resolved hypokalemia.   HISTORY:  The patient is a pleasant 77 year old female who has had  several falls over the past month leading to the discontinuation of her  Coumadin despite her implantable defibrillator.  She has recently had  two subdurals which both resolved as of the CT done yesterday.  The  patient, unfortunately, fell on the morning of August 19 while reaching  for the refrigerator door.  She noted immediate onset of right hip pain,  was taken to the emergency room where she was evaluated and found to  have a right intertrochanteric hip fracture, subtrochanteric hip  fracture.  Dr. Shelle Iron was called for admission.  We then  proceeded to  call Dr. Wylene Simmer as well as Dr. Patty Sermons for cardiac and medical  clearance.  It was discussed with the family, once clearance was  obtained, the risks and benefits of the surgery.  They agree to proceed  with the procedure.  The patient was then transferred to [facility] for  operative intervention.   PROCEDURE:  The patient was taken to the OR and underwent IM nailing of  the right hip by surgeon Dr. Jene Every, assistant none.  Anesthesia  general.  Complications none.   CONSULTANT:  PT/OT, Care Management, Dr. Wylene Simmer, Dr. Patty Sermons.   LABORATORY:  On admission, CBC showed a hemoglobin 9.6, hematocrit 28.7,  this is followed throughout the hospital course.  The patient did drop  slightly after surgery to 10.4.  She continued to trend downward.  On  August 21, she had a hemoglobin 6.4, hematocrit 18.7.  She was then  transfused 2 units of packed red blood cells.  She improved to a level  of 8.8 the following day.  The patient continued to stabilize at time of  discharge.  Hemoglobin was 9.6, hematocrit 28.2.  Preoperative  coagulation studies show an INR of 1.1.  She was then placed on Coumadin  postoperatively.  She responded quickly to this.  At the time of  discharge, she had INR of 4.3.  It as recommend at this time we stop the  Coumadin due to her sensitivity.  Routine chemistries were obtained  preoperatively which showed a sodium of 42, potassium 3.6, BUN of 31,  creatinine 1.77.  This was monitored throughout the hospital course.  Sodium remained stable.  Potassium did drop to 3.0.  She was  supplemented at the time of discharge.  She was stable with a level of  3.6.  Renal function remained stabilized.  At time of discharge, she had  a BUN of 18, creatinine 1.20 with a GFR of 43.  Preoperative urinalysis  was negative.  Blood type is O negative.  Preoperative chest x-ray  showed stable cardiomegaly with a pacer.  No active lung disease was  noted.  CT of  the head was performed as well which shows an acute  intracranial abnormalities.  A.m. preoperative films of the hip show  acute angulated right intertrochanteric and subtrochanteric femoral  fracture.  Postoperative films show good placement of the rod.   HOSPITAL COURSE:  The patient was initially evaluated in [facility]  emergency room.  Clearance was obtained through Dr. Wylene Simmer as well as  Dr. Patty Sermons.  She was then transferred over to [facility]  for single operative intervention where she was evaluated by Dr. Shelle Iron.  Dr. Shelle Iron met with the family, recommend fixation with IM nail.  The  risks and benefits of this were discussed, and the family and the  patient wished to proceed.   HISTORY OF PRESENT ILLNESS:  The patient was taken to the OR and the  with underwent the above stated procedure without difficulty.  She was  then transferred to the PACU and then to telemetry as per Dr. Patty Sermons  for continued postoperative care.  Postoperatively, the patient did  fairly well.  She was continued to be followed by Dr. Wylene Simmer as well as  Dr. Patty Sermons.  Postop day #1, the patient was fairly stable.  She had a  low hemoglobin being 8.4, hematocrit 23.4, but Dr. Wylene Simmer felt it was  okay to monitor this.  She was started on iron supplementation with  repeat labs ordered for the a.m.  The patient was having some pain  control issue.  She is currently on a fentanyl patch per the family.  Dr. Wylene Simmer requested that she be placed on PCA analgesics.  This was  done.  She was placed on low dose morphine.  Dr. Patty Sermons did sign off  on the patient and did contact [facility] to have her  ICD turned back on.  The patient was placed on Coumadin.  For DVT  prophylaxis.  She was also placed on TED hose.  The patient did fairly  well.  Postop day #2, she did have critical hemoglobin value of 6.4.  She was transfused 2 units of packed red blood cells at that time  without  difficulty.  Coumadin was held as well as any physical activity.  Unfortunately, the patient did remain fairly confused over the course of  next couple days.  She did have decrease in her potassium which was  supplemented accordingly.  Discharge planning was initiated.  On August  23, we did slowly wean her off her PCA.  Confusion did improve this  point, hemoglobin on August 23 was 8.2, hematocrit 23.3, potassium  improved to 3.7.  The patient did have some serosanguineous drainage  from her incision.  We did increase her daily dressing changes as well  as applied Neosporin to the area.  Repeat hemoglobin was ordered later  in the day with improvement in that.  On the August 24, the patient was  doing significantly better.  Hemoglobin stabilized at 8.5, hematocrit  24.9.  Her INR was 3.3 even though she has had two days without her  Coumadin.  Incision was improving with decreasing drainage.  She was  afebrile.  Potassium was stable.  At this point, we did recommend  discontinuing her PCA.  We did consult PT/OT to get the patient out of  bed.  Continue to hold her Coumadin at this point.  Continue with iron  supplementation.  By postoperative day #6, it is felt the patient was  stable for discharge to skilled nursing facility of choice.  Pain was  well-controlled with p.o. medications as well as with her Fentanyl  patch.  Hemoglobin was stable at 9.6, hematocrit 28.2, INR 4.3.  She had  no active signs of bleeding, incision was clean and dry with no excess  drainage, right thigh was soft, nontender, no evidence of large hematoma  was noted.  Deep pedal pulses are equal.  Motor and neurovascular  function intact to lower extremity.   DISPOSITION:  Patient stable to be discharged to skilled nursing  facility of choice.  She is going to need follow-up with Dr. Wylene Simmer.  He may [name] [name] her in the nursing facility.  She will need follow-up with  Dr. Shelle Iron in 2 weeks postoperatively for x-rays and  staple removal.  If  transportation is a issue for the patient then can have staples removed  at the facility no more than 14 days postoperatively, and have x-rays  taken there and sent to our office for review.   DISCHARGE INSTRUCTIONS:  1. Wound Care.  They are to change his dressing daily and continue to      apply Neosporin if there is any active drain, otherwise just dry      dressing.  Continue with periodic ice.  2. Activity.  She is to be partial weightbearing on the right lower      extremities.  Fall precautions.  Encouraged foot pump.   DISCHARGE MEDICATIONS:  1. Coreg 12.5 mg one p.o. b.i.d.  2. Digoxin 0.125 mg p.o. daily.  3. Fentanyl patch 25 mcg q.72 h.  4. Colace 100 mg one p.o. b.i.d.  5. Senokot p.r.n.  6. Protonix 40 mg daily as needed.  7. Zoloft 50 mg one p.o. daily.  8. Tricor 145 mg one p.o. daily.  9. Digoxin 0.125 mg one p.o. daily.  10.Iron supplementation 325 mg one p.o. b.i.d.  11.Requip 4 mg one p.o. q.h.s.  12.Sertraline 50 mg one p.o. q.h.s.  13.Spiriva p.r.n.  14.Synthroid 88 mcg one p.o. daily.  15.Pravastatin 40 mg one p.o. daily.  16.Vitamin C 500 mg daily.  17.Vicodin one to two p.o. q.4-6 p.r.n. pain.  18.We are stopping her Coumadin at this point.  She will need      continuation of INR.  Currently she has INR 4.3.  They will need to      check this until it stabilizes to below 2.  Once it is below 2,      then recommend resuming her 81 mg aspirin daily for DVT      prophylaxis.  The patient should also continue  with her TED hose      until she is more mobile.  Also, continuation of her Foley until      she is to ambulate at least to bedside commode.  Would implement      skin precautions as well.  Diet is heart healthy.   CONDITION ON DISCHARGE:  Stable.   FINAL DIAGNOSIS:  Status post IM nailing to the right hip.      Roma Schanz, P.A.      Jene Every, M.D.  Electronically Signed    CS/MEDQ  D:  01/01/2009  T:   01/01/2009  Job:  [id number]

## 2010-09-22 NOTE — H&P (Signed)
NAME:  [name] [name], [name] [name]         ACCOUNT NO.:  [id number]   MEDICAL RECORD NO.:  [id number]          PATIENT TYPE:  INP   LOCATION:  6705                         FACILITY:  [facility]   PHYSICIAN:  Mark A. Perini, M.D.   DATE OF BIRTH:  May 12, 1924   DATE OF ADMISSION:  12/15/2007  DATE OF DISCHARGE:                              HISTORY & PHYSICAL   CHIEF COMPLAINT:  Shortness of breath.   HISTORY OF PRESENT ILLNESS:  Alexxia is a very pleasant 77 year old  female with past medical history as listed below, reports 2-3 weeks of  low back pain jumping from the left to right side of her low back.  The  pain is radiating down her left leg and she has some numbness in her  left lower extremity.  She had a CT scan of her back a few weeks ago  which showed significant degenerative changes per her history.  She also  had some cramping problems in her legs.  She has been treated per  Orthopedics and recently placed on oxycodone, Lidoderm patches and  prednisone.  She did see a pain doctor but it was unclear if it was felt  that she might be appropriate for a steroid shot.  She noticed that when  she bends over in the yard, when she is doing yard work, her back hurts,  and then when she stands up she has some hurting in her chest.  Her  daughter came to check on her yesterday, and she reported some shortness  of breath and EMS was called.  In the emergency room, she was found to  have a temperature of 100.4 and possible left lower lobe infiltrate on  chest x-ray.  She is therefore admitted for further care.  She does  report possibly a few chills but no definite fever.  She has had  significant constipation for the last week.  Last good bowel movement  approximately 5-6 days ago, although, she has had some small amount of  stool recently.  She denies any blood from above or below and no  significant coughing.   PAST HISTORY:  1. Nonischemic cardiomyopathy with ejection fraction of 15-20% noted      in 2005.  She has a defibrillator placed from 2005.  She had a      pneumothorax with that procedure, but that resolved.  She denies      any firing of the defibrillator in the last few years.  She is on      anticoagulation therapy apparently for low ejection fraction.  2. History of colon polyps.  3. Back surgery remotely.  4. G zero parity status.  5. Restless leg syndrome.  6. COPD.  7. Obstructive sleep apnea.  8. Bilateral knee arthritis.  She did have a steroid shot in her right      knee several years ago, but her left knee has been bothering her      more recently.   ALLERGIES:  None.   MEDICATIONS:  In the emergency room she was given morphine which did  help her some.  She was also given Rocephin and azithromycin  in the ER.  Recently, she was prescribed prednisone, oxycodone and Lidoderm patches.  Her other home medication regimen includes:  1. Hydrocodone prior to the recent oxycodone prescription.  2. Pravastatin 40 mg each evening.  3. Furosemide 80 mg daily.  4. Warfarin 2.5 mg as directed.  5. Klor-Con apparently 20 mEq daily.  6. Ramipril 10 mg daily.  7. Requip 2 mg, she says she takes one to two in the evening.  8. Carvedilol 25 mg twice a day.  9. Tricor 145 mg daily.  10.Protonix 40 mg daily.  11.Vitamin D 2000 units daily.  12.Vitamin E 400 units daily.  13.Calcium with D one daily.  14.Aspirin 81 mg daily.  15.One-a-day vitamin daily.  16.Tylenol PM.  17.Spiriva for the last 6 months one puff daily.  18.Nasacort 2 sprays each nostril daily.  19.Sertraline unknown dose one daily.  20.Digoxin 0.125 mg one daily.   SOCIAL HISTORY:  She is married.  Husband's name is Molly Maduro.  She quit  tobacco in 2004 and has around a 40 pack-year smoking history prior to  this.  No alcohol or drug use.  She has two adopted daughters.  Her  daughter [name] is with her now.   FAMILY HISTORY:  Notable for heart problems and colon cancer.   REVIEW OF SYSTEMS:  As  noted above.  She denies any significant  worsening edema.   PHYSICAL EXAMINATION:  VITAL SIGNS:  Maximum temperature 100.4 in the  ER, now 98.7, pulse 85, respiratory 16, blood pressure 141/71, 93%  saturation on 2 liters of oxygen.  She was somewhat hypoxic on room air  when she initially presented to the ER with sats less than 90%.  GENERAL:  She is age appropriate.  HEENT:  No pallor, no icterus.  NECK:  No significant JVD noted.  LUNGS:  She has decreased breath sounds bilaterally but no significant  rales, wheezes or rhonchi.  HEART:  Regular rate and rhythm with a 2/6 murmur heard in systole at  the right sternal border.  ABDOMEN:  There is no rub or gallop.  Abdomen: Soft, nontender, but is  slightly distended, there are no masses palpated.  EXTREMITIES:  She has trace ankle edema.  She moves extremities x4.  NEUROLOGICAL:  She is alert and oriented x4, and she is grossly normal  sensation to both lower extremities bilaterally.   LABORATORY DATA:  Sodium 138, potassium 5.1, chloride 106, CO2 26, BUN  32, creatinine 1.99.  GFR 24 ml/minute.  Glucose 92.  LFTs normal except  an albumin of 3.1.  White count 7.5.  There are 87% segs, 6%  lymphocytes, 6% monocytes, hemoglobin 9.9, platelet count 127,000, INR  is 2.6.  Cardiac enzymes are negative times one set.  Chest x-ray  personally reviewed shows possible left greater than right bibasilar  infiltrate and pacer AICD is noted.   ASSESSMENT/PLAN:  An 77 year old female with COPD, congestive heart  failure probable community-acquired pneumonia and low back pain with  radiculopathy.  Will admit and treat her COPD with Spiriva and Pulmicort  and Xopenex.  We will give her Rocephin and Zithromax for possible  community-acquired pneumonia.  Will continue her home medications to the  best of our ability.  Will manage her back pain with Lidoderm patches,  morphine and we will ask orthopedics to see her.  However, she is not a  good  surgical candidate and really not a great candidate for steroid  injections as well, but this may need  to be attempted if her symptoms do  not improve.  She is a full code  status.  She does have stage IV chronic kidney disease and we will need  to monitor her BUN and creatinine.  She also has anemia.  We will check  iron profile and B12 levels.  However, she may need erythropoietin  dosing at some point as well.      Mark A. Perini, M.D.  Electronically Signed     MAP/MEDQ  D:  12/16/2007  T:  12/16/2007  Job:  [id number]   cc:   [name], M.D.  Lubertha Basque Jerl Santos, M.D.  Gaspar Garbe, M.D.

## 2010-09-22 NOTE — Discharge Summary (Signed)
NAME:  [name], [name]         ACCOUNT NO.:  [id number]   MEDICAL RECORD NO.:  [id number]          PATIENT TYPE:  INP   LOCATION:  6705                         FACILITY:  [facility]   PHYSICIAN:  Gaspar Garbe, M.D.DATE OF BIRTH:  1924/08/16   DATE OF ADMISSION:  12/15/2007  DATE OF DISCHARGE:  12/20/2007                               DISCHARGE SUMMARY   FINAL DIAGNOSES:  1. Right-sided pneumonia, resolving with Ceftin.  2. Chronic back pain, improved with narcotics.  3. Nonischemic cardiomyopathy with ejection fraction of 15-20%.  4. Coumadin secondary to above.  5. History of colon polyps.  6. History of remote back surgery.  7. History of restless leg.  8. Chronic obstructive pulmonary disease.  9. Obstructive sleep apnea.  10.Hypercholesterolemia.   MEDICATIONS ON DISCHARGE:  1. Lasix 40 mg once daily.  2. Pravastatin 40 mg once daily in the evening.  3. Potassium chloride 20 mEq once daily.  4. Altace 10 mg once daily.  5. Requip 2 mg twice daily.  6. TriCor 48 mg once daily.  7. Protonix 40 mg once daily.  8. Vitamin D 2000 units once daily.  9. Carvedilol 25 mg twice daily.  10.Synthroid 75 mcg once daily.  11.Colace 100 mg twice daily.  12.Iron sulfate 325 mg twice daily.  13.Spiriva HandiHaler 1 inhalation once daily.   NEW MEDICATIONS:  1. Pulmicort turbuhaler 180 mcg inhaled twice daily.  2. Ceftin 500 mg twice daily x6 days.  3. Fentanyl patch/Duragesic 25 mcg change q.3 days.  4. Oxycodone 10 mg every 4-6 hours as needed for pain.  5. Coumadin 1 mg once daily.  INR 2.1 on December 20, 2007.   CONSULTATIONS:  Undertaken by Dr. Luiz Blare with Orthopedics.   LABORATORY TESTING:  Stool cards were negative.  White count 4.4,  hemoglobin 10.7, hematocrit 31.3, and platelets 274.  BUN and creatinine  are 43 and 1.9 respectively, which are baseline.  Glucose 112.  BNP very  slightly elevated at 103.  INR is 2.1.   OTHER PERTINENT LABS:  Folate greater than 20.   Vitamin B12 of 311.  Iron still decreased at less than 10 and ferritin 119.  Digoxin level  1.2.   PHYSICAL EXAMINATION ON THE DATE OF DISCHARGE:  VITAL SIGNS:  Blood  pressure 93/64, heart rate 61, respiratory rate 16, temperature 98.6,  and sating 92% on room air.  GENERAL:  No acute distress.  HEENT:  Normocephalic and atraumatic.  PERRLA.  EOMI.  ENT is within  normal limits.  NECK:  Supple.  No lymphadenopathy, or JVD noted.  HEART:  Regular rate and rhythm.  LUNGS:  Clear to auscultation bilaterally.  ABDOMEN:  Soft and nontender.  Normoactive bowel sounds.  EXTREMITIES:  Trace edema, which is normal.  NEURO:  Oriented to person, place, and time.   SUMMARY OF HOSPITAL COURSE:  [name] [name] was admitted for several  reasons.  She had been followed up with Dr. Nolon Nations office with  regards to her back pain, which was not being treated sufficiently.  She  had been given hydrocodone, but was still having pain and contacted our  office the  week prior to her admission, wondering what to do given that  she has congestive heart failure and is on Coumadin.  Her options were  considerably limited and this was discussed with her.  She went back to  Dr. Nolon Nations office and was started on hydrocodone, which did not help  her pain.  She felt somewhat short of breath and was having considerable  pain and was admitted through the emergency room by my partner, Dr.  Waynard Edwards.  At that time, she was given morphine by IV and found to have a  right-sided infiltrate versus atelectasis.  She was treated empirically  with Rocephin with radiographic resolution on day #3.  She was still  having some coughing.  It was thought to be a mild COPD exacerbation and  was continued on nebulized steroids as well as nebulized Xopenex.  She  was continued on her overnight CPAP as well.  The patient was converted  to Ceftin and will have an empiric 10-day course of this.   Consultation was undertaken by Dr.  Luiz Blare who indicated that he did not  have any further resolutions for her pain and will either consider  physical therapy or chiropractic care.  The patient was started by  myself on fentanyl patch 25 mcg with morphine as needed and this was  converted over to oxycodone the day prior to her discharge with her only  requiring 2 pills for breakthrough.  She was considerably happier and  had some improved mobility with pain control as well.  Physical Therapy  saw the patient and suggested a rear-wheeled walker as well as some home  care, this was being arranged at the time of her discharge.  The patient  otherwise remained hemodynamically stable.  Her blood pressure was  usually in the low range and she is currently not dizzy with this.  No  changes were made in her cardiovascular medications.  During her  hospitalization, her INR stayed stable despite the antibiotics and she  will have this rechecked in our office on December 22, 2007.  The patient  was discharged in good condition to home with her family.  She had been  eating and getting much more comfortable sleep.   CONDITION ON DISCHARGE:  Stable.      Gaspar Garbe, M.D.  Electronically Signed     RWT/MEDQ  D:  12/20/2007  T:  12/20/2007  Job:  [id number]

## 2010-09-25 NOTE — Discharge Summary (Signed)
NAME:  [name] [name], [name] [name]                   ACCOUNT NO.:  [id number]   MEDICAL RECORD NO.:  [id number]                   PATIENT TYPE:  INP   LOCATION:  3738                                 FACILITY:  [facility]   PHYSICIAN:  Duke Salvia, M.D.               DATE OF BIRTH:  [birth year]   DATE OF ADMISSION:  01/15/2004  DATE OF DISCHARGE:  01/16/2004                                 DISCHARGE SUMMARY   DISCHARGE DIAGNOSES:  1.  Nonischemic cardiomyopathy.  2.  Class III congestive heart failure symptoms with ejection fraction 10-      20%.  3.  Left heart catheterization demonstrating normal coronary anatomy.  4.  Left bundle branch block.  5.  Status post interrupted implant of a biventricular implantable      cardioverter defibrillator.  the week of January 06, 2004 secondary to intraoperative left pneumothorax.   SECONDARY DIAGNOSES:  1.  History of colon polyps, back surgery.  2.  Family history of coronary artery disease.  3.  Former tobacco habituation.   PROCEDURES:  January 15, 2004 implantation of [employer]  model [place] CRT serial number [id number]. Patient tolerated the procedure well  and aside from some soreness at the incision site has had no further  complaints.   Post-procedure day number one the patient is V-pacing. She has had  interrogation which shows appropriate narrowing of the QRS signal with  cardiac resynchronization of therapy in place. Patient is receiving 96%  oxygen saturations. Vital signs are stable. The patient remains afebrile.  She will go home with the following medications:  1.  Altace 5 mg daily.  2.  Coreg 25 mg twice daily.  3.  Lasix 40 mg one to one half tab daily.  4.  Potassium chloride 10 mEq twice daily.  5.  Enteric-coated aspirin 325 mg daily.  6.  Calcium with vitamin D twice daily.  7.  Zoloft 50 mg daily.  8.  Coumadin at her prehospitalization dose.  9.  Keflex 500 mg p.o. b.i.d. for a 5 day course.   Pain  management will be with Vicodin 5/500 one to two tabs every 4-6 hours  as needed. Activity will be as directed by mobility instructions which have  been given to the patient. Her discharge diet is low-sodium, low-cholesterol  diet. She is asked to keep her incision dry for the next week. She is to  sponge bathe until Wednesday, January 22, 2004. She will have a follow up  visit to the [facility],  Monday, February 03, 2004  at noon and she will see Dr. Graciela Husbands in 3 months, April 22, 2004 at 3:00 in  the afternoon.   BRIEF HISTORY:  Ms. [name] [name] is a 77 year old female found to  have non-ischemic cardiomyopathy in March of this year when she presented  with severe heart failure. She underwent diagnostic evaluation including  left heart catheterization. The  study showed normal coronary anatomy, mild  mitral regurgitation. However, ejection fraction was found to be between 10%  and 20%. She also has chronic left bundle branch block. Apparently, her  symptoms have been in place since the fall of 2004. She has been  subsequently treated medically and does well, although she still has class  III symptoms which are manifested by significant fatigue, exercise  intolerance, dyspnea when climbing just a few stairs.  She does not have  orthopnea or peripheral edema. She has occasional paroxysmal nocturnal  dyspnea. [name] [name] meets criteria for implantable cardioverter  defibrillator with left bundle branch block and class III symptoms as well  as decreased ejection fraction. She will also benefit from cardiac  resynchronization therapy. The risks and benefits and the aims of treatment  have been discussed with the patient and her daughter. They understand these  risks and wish to proceed.   HOSPITAL COURSE:  Patient presented electively January 15, 2004. The  patient had presented during the week of January 06, 2004 originally for BiV  implantation. This  procedure was aborted secondary to left pneumothorax with  some mild hemoptysis. Chest x-ray showed a small apical left pneumothorax  which has been resolved by x-ray taken prior to the procedure on September,  7, 2005 when she presented for staged part 2 staged re-attempt at  implantation. This attempt on January 15, 2004 was successful.  Interrogation shows that the device was working very well. Her QRS signal  shows appropriate changes with cardiac resynchronization therapy. The  patient has had no post procedure complications and her chest x-ray shows  leads are intact and appropriately placed without pneumothorax. The patient  will go home with the medications and followup as dictated.      Maple Mirza, P.A.                    Duke Salvia, M.D.    GM/MEDQ  D:  01/16/2004  T:  01/16/2004  Job:  [id number]   cc:   Duke Salvia, M.D.   Gaspar Garbe, M.D.  [address]  [place]  [place] [address]  Fax: [phone]   [name], M.D.  [address], [address]  [name]  [place] [address]  Fax: [phone]

## 2010-09-25 NOTE — Assessment & Plan Note (Signed)
[facility]                             PULMONARY OFFICE NOTE   NAME:[name] [name], [name] [name]                MRN:          [id number]  DATE:09/09/2006                            DOB:          13-Feb-1925    PULMONARY FOLLOW UP VISIT   I saw on [name] in follow today for her obstructive sleep  apnea, restless leg syndrome, and dyspnea.   She was seen by her home care company representative for her sleep apnea  and was noted to have problems with feeling short of breath.  She had  undergone a overnight oximetry which showed that she had a low oxygen  level of 82%, with a mean oxygenation of 94% with a 1.4% of the study  with an oxygen saturation of less than 88%.  She complains of dyspnea on  exertion.  She is not having much as far as coughing or wheezing, but  does have episodes of chest tightness.  She is not had any recent fevers  or chills.  She is not having much.  With regards to sputum production.  She does have a significant history of tobacco abuse.  Her medication  list was reviewed; and was essentially unchanged.   PHYSICAL EXAMINATION:  On physical exam, she is 160 pounds, temperature  is 98, blood pressure is 128/62.  Heart rate is 60.  Oxygen saturation  is 97% on room air.  HEENT:  There is no sign of tenderness, no discharge, on lesion.  HEART:  S1-S2.  CHEST:  She has a prolonged expiratory phase, but no wheezing.  ABDOMEN:  Soft, nontender.  EXTREMITIES:  No edema.   IMPRESSION:  1. Dyspnea on exertion.  I would be concerned that she may have some      degree of obstructive airway disease.  I will give her an empiric      trial of Spiriva one puff daily.  As already scheduled the      pulmonary function test, then further recommendations will be made      after review of this.  2. Obstructive sleep apnea.  She apparently is doing reasonably well      on her CPAP, and I will continue her on her current settings.  3.  Restless leg syndrome.  She appears to be reasonably stable with      regards to this and I would assess this further in our next      followup.  4. I will follow up with her in approximately 2-3 weeks.     Coralyn Helling, MD  Electronically Signed    VS/MedQ  DD: 09/12/2006  DT: 09/12/2006  Job #: [id number]   cc:   Gaspar Garbe, M.D.

## 2010-09-25 NOTE — Op Note (Signed)
NAME:  [name] [name], [name] [name]                   ACCOUNT NO.:  [id number]   MEDICAL RECORD NO.:  [id number]                   PATIENT TYPE:  AMB   LOCATION:  ENDO                                 FACILITY:  [facility]   PHYSICIAN:  Anselmo Rod, M.D.               DATE OF BIRTH:  [birth year]   DATE OF PROCEDURE:  12/16/2003  DATE OF DISCHARGE:                                 OPERATIVE REPORT   PROCEDURE:  Colonoscopy with snare polypectomies x2, cold biopsies x8.   INSTRUMENT:  Olympus video colonoscope.   INDICATIONS FOR PROCEDURE:  A 77 year old female with personal history of  colonic polyps, family history of colon cancer. Recent problem with rectal  bleeding on Coumadin. Undergoing screening colonoscopy to rule out recurrent  polyps, masses, etc.   Pre-procedure preparation:  Informed consent was procured from the patient.  The patient fasted for 8 hours prior to the procedure.  Prep was a bottle of  magnesium citrate and a gallon of GoLYTELY  the night prior to the  procedure.  Coumadin was held for 5 days prior to the procedure.   PRE-PROCEDURE PHYSICAL:  VITAL SIGNS:  Patient had stable vital signs.  NECK: Supple.  CHEST:  Clear to auscultation.  HEART:  S1/S2 regular.  ABDOMEN: Soft with normal bowel sounds.   DESCRIPTION OF PROCEDURE:  The patient was placed in the left lateral  decubitus position, sedated with 70 mg of Demerol and 8 mg of Versed in  slow, incremental doses.  Once the patient was adequately sedated and  maintained on low flow oxygen, and continuous cardiac monitoring, the  Olympus video colonoscope was advanced from the rectum to the cecum.  The patient had some residual stool in the right colon.  Multiple washings  were done.  Small internal hemorrhoids were seen on retroflexion in the  rectum.  A mucosal bridge resembling possible pseudo-polyp was seen in the  rectosigmoid area. There were several pseudo-diverticula noted in this area  as well, question  old colitis.   Two polys were snared from 50 cm, placed in bottle #1.  Small, sessile polyp  were biopsied from rectosigmoid colon where the pseudo-diverticular  appearance was noted.  The transverse colon, right colon and cecum appeared  normal. The patient tolerated the procedure well. The appendiceal orifice  and ileocecal valve were clearly visualized and photographed.   IMPRESSION:  1. Small internal hemorrhoids.  2. Mucosal bridge with question pseudo-polyps and pseudo-diverticular     formation in the rectosigmoid with multiple small polyps biopsied from     this area.  3. Two polyps snared from 50 cm.   RECOMMENDATIONS:  1. Hold off on the Coumadin until further orders by Dr. Patty Sermons.  2. Await pathology results.  3. Avoid nonsteroidals for now.  4. Outpatient follow up in the next 2 weeks for further recommendations.  Anselmo Rod, M.D.    JNM/MEDQ  D:  12/16/2003  T:  12/16/2003  Job:  [id number]   cc:   Vania Rea. Jarold Motto, M.D. [facility]   Laqueta Linden, M.D.  [address]  [place]  [place] [address]  Fax: [phone]   [name], M.D.  [address], [address]  [place]  [place] [address]  Fax: [phone]   Duke Salvia, M.D.

## 2010-09-25 NOTE — Procedures (Signed)
NAME:  [name] [name], [name] [name]         ACCOUNT NO.:  [id number]   MEDICAL RECORD NO.:  [id number]          PATIENT TYPE:  OUT   LOCATION:  SLEEP CENTER                 FACILITY:  [facility]   PHYSICIAN:  Coralyn Helling, M.D.      DATE OF BIRTH:  1924/09/23   DATE OF STUDY:  09/13/2005                              NOCTURNAL POLYSOMNOGRAM   INDICATION FOR THE STUDY:  The patient has a history of cardiomyopathy,  hypertension, and chronic renal insufficiency and has had a defibrillator  implanted and she has symptoms of restless legs and snoring and daytime  hypersomnolence.  She is referred to the sleep lab for evaluation of sleep  disordered breathing.  REM score today is 3.   MEDICATIONS:  Altace Coreg, Lasix, potassium, pravastatin, Synthroid,  aspirin, calcium, Zoloft, Coumadin, Tylenol Arthritis, ferrous sulfate,  vitamin C and digoxin.   SLEEP ARCHITECTURE:  Total recording time was 63.3 minutes. Total sleep time  was 352.5 minutes.  Sleep efficiency was 76%.  The patient was observed in  all stages of sleep, however, there was a reduction in the percentage of  slow wave sleep at 2% and a reduction in the percentage of REM sleep at 8%.  Sleep latency was 13.5 minutes. REM latency was 423.5 minutes which was  prolonged.  The patient was observed in predominately the supine position.   RESPIRATORY DATA:  The apnea copy index was 31.5. These events were  exclusively obstructive in nature.  Of note is that the patient had a  significant REM effect with a REM apnea index of 77 and mild to moderate  snoring was noted by the technician.  The average respiratory rate was 28.   OXYGEN DATA:  The baseline oxygen was 95%, the actual saturation nadir was  81%. The patient spent a total of 431.1 minutes with an oxygen saturation  between 91 and 100% and 15.4 minutes with an oxygen saturation between 81  and 90%.   CARDIAC DATA:  EKG showed normal sinus rhythm with ventricular pacing.   MOVEMENT  PARASOMNIA:  The periodic limb movement index was 31.8 which is  elevated.   IMPRESSION:  This study shows evidence for severe obstructive sleep apnea as  demonstrated by an apnea index of 31.5 with an oxygen saturation nadir of  81%.  Of note is that she had a significant REM effect, as well.  Additionally, she had an increase in her periodic limb movement index of  31.8 and clinical correlation would be  necessary to evaluate whether she has symptoms of restless leg syndrome as  well. At this time, the patient should be referred back to sleep lab for a  CPAP titration study.      Coralyn Helling, M.D.  Diplomat, Biomedical engineer of Sleep Medicine  Electronically Signed     VS/MEDQ  D:  09/22/2005 12:11:27  T:  09/22/2005 20:32:43  Job:  [id number]

## 2010-09-25 NOTE — Discharge Summary (Signed)
NAME:  [name] [name], [name] [name]                   ACCOUNT NO.:  [id number]   MEDICAL RECORD NO.:  [id number]                   PATIENT TYPE:  INP   LOCATION:  2905                                 FACILITY:  [facility]   PHYSICIAN:  Duke Salvia, M.D.               DATE OF BIRTH:  Sep 29, 1924   DATE OF ADMISSION:  01/10/2004  DATE OF DISCHARGE:  01/11/2004                                 DISCHARGE SUMMARY   PROCEDURES:  1.  Attempted biventricular pacer implant (aborted).   HOSPITAL COURSE:  The patient is a 77 year old female who was recently moved  to the [place] area from [place].  She has a history of non-ischemic  cardiomyopathy diagnosed in March of this year and had a catheterization  showing normal coronaries, mild MR and an EF of 10 to 20%.  She has class  III heart failure and was evaluated by Dr. Graciela Husbands. He felt that she met the  criteria for an implantable cardioverter defibrillator and she was scheduled  for this on January 10, 2004.   The patient arrived for the procedure, but it was aborted secondary to a  probable acute lung injury secondary to a needle puncture.  She had copious  hemoptysis and a chest x-ray was checked at that time which was within  normal limits, but again in four hours.  The recheck chest x-ray showed a 5%  apical pneumothorax.  A pulmonary consult was called.  She was started on  02.   Dr. Sung Amabile saw the patient and felt that her chest x-ray should be repeated  in the a.m. on January 11, 2004 and that this resolve spontaneously.  He  felt that if her chest x-ray was within normal limits on September 3, she  could go home if otherwise appropriate and did not need to be seen by  pulmonary again unless her symptoms worsened.   On January 11, 2004 the patient had a repeat chest x-ray.  It showed a 5%  upper lobe pneumothorax which was not significantly changed since the  previous day.  Dr. Diona Browner evaluated her and felt that she could be  discharged home.  The patient was considered stable for discharge on  January 11, 2004 and is to follow-up as an outpatient for re-attempt of the  biventricular pacer on January 15, 2004.  Her Coumadin was placed on hold.   DISCHARGE DIAGNOSES:  1.  Pneumothorax after attempted AICD with hemoptysis.  2.  Class III congestive heart failure.  3.  Non-ischemic cardiomyopathy with an ejection fraction of 10 to 20%.  4.  Family history of coronary artery disease.  5.  Left bundle branch block.  6.  History of back surgery.   DISCHARGE INSTRUCTIONS:  1.  Her activity level is to include no strenuous activity.  2.  She is to hold her Coumadin until further instructions from Dr. Graciela Husbands.  3.  She is to return on September 7 for a  repeat procedure by Dr. Graciela Husbands and      the office will call.  4.  She is to call for any problems with the injection site.   DISCHARGE MEDICATIONS:  1.  Aspirin 325 mg daily.  2.  Coreg 25 mg b.i.d.  3.  Calcium one to two tablets b.i.d.  4.  Lasix one and a half tablets daily.  5.  KCL 20 mEq daily.  6.  Zoloft 50 mg daily.  7.  Altace 5 mg daily.      Theodore Demark, P.A. [facility]                  Duke Salvia, M.D.    RB/MEDQ  D:  01/11/2004  T:  01/13/2004  Job:  [id number]   cc:   Gaspar Garbe, M.D.  [address]  [place]  [place] [address]  Fax: [phone]   [name], M.D.  [address]  [place]  [place] [address]  Fax: [phone]

## 2010-09-25 NOTE — Procedures (Signed)
NAME:  [name] [name], [name] [name]         ACCOUNT NO.:  [id number]   MEDICAL RECORD NO.:  [id number]          PATIENT TYPE:  OUT   LOCATION:  SLEEP CENTER                 FACILITY:  [facility]   PHYSICIAN:  Coralyn Helling, M.D.      DATE OF BIRTH:  09/16/1924   DATE OF STUDY:  10/02/2005                              NOCTURNAL POLYSOMNOGRAM   REFERRING PHYSICIAN:  Dr. Coralyn Helling   INDICATION FOR THE STUDY:  This individual had undergone a diagnostic  polysomnogram on Sep 13, 2005.  This showed am apnea/hypopnea index of 31.5  with an oxygen saturation nadir of 81%.  She returns to the sleep lab for a  CPAP titration study.   EPWORTH SCORE:  4   MEDICATIONS:  Digoxin, Altace, Coreg, Lasix, potassium, pravastatin,  Synthroid, aspirin, calcium, Zoloft, Coumadin, Tylenol Arthritis, Centrum  Silver, glucosamine, chondroitin sulfate, ferrous sulfate, vitamin C and  Protonix.   SLEEP ARCHITECTURE:  The total recording time was 495.5 minutes.  Total  sleep time was 292.5 minutes.  Sleep efficiency was 59%.  The patient was  observed in all stages of sleep.  However, there was a significant reduction  in the percentage of slow wave sleep at 5% of the study.  She had sleep  latency of 85 minutes which was prolonged, and a REM latency of 234.5  minutes which was prolonged.  Of note is that her sleep difficulties seemed  to improve after a change in her CPAP mask and the patient was observed in  both the supine and supine position.   RESPIRATORY DATA:  The patient was titrated from a pressure setting of 5 to  11 cmH2O.  Snoring was eliminated at 11 cmH2O.  At 11 cmH2O the  apnea/hypopnea index was reduced to 0 and the patient was observed in both  REM sleep and the supine position at this pressure setting and the baseline  respiration was 24.   OXYGEN DATA:  The baseline oxygenation was 94% at the CPAP pressure setting  of 11.  The mean oxygenation during non-REM was 95.4 and the mean during REM  was  95.5.  The minimal during non-REM was 91 and the minimal during REM was  92%.   CARDIAC DATA:  Baseline heart rate was 63.  EKG showed ventricular pacing.   MOVEMENT/PARASOMNIA:  The periodic limb movement index is 50.5, which is  elevated.   IMPRESSION:  This is a CPAP titration study.  At a CPAP pressure setting of  11 cmH20 the apnea/hypopnea index was reduced to 0, snoring was eliminated,  and oxygenation was stabilized.  The patient was observed in both REM and  supine sleep at this pressure setting.  Of note is that the patient seems to  tolerate the use of a Mirage Quattro medium full-face mask with heated  humidification much better, especially towards the latter half of the night.  Also  of note is that she has an elevation in her periodic limb movement index and  clinical correlation would be necessary to determine the significance of  this.      Coralyn Helling, M.D.  Diplomat, Biomedical engineer of Sleep Medicine  Electronically Signed  VS/MEDQ  D:  10/19/2005 09:00:10  T:  10/19/2005 10:22:17  Job:  [id number]

## 2010-09-25 NOTE — Consult Note (Signed)
NAME:  [name] [name], [name] [name]                   ACCOUNT NO.:  [id number]   MEDICAL RECORD NO.:  [id number]                   PATIENT TYPE:  INP   LOCATION:  2905                                 FACILITY:  [facility]   PHYSICIAN:  Oley Balm. Sung Amabile, M.D. [facility]          DATE OF BIRTH:  1925/02/08   DATE OF CONSULTATION:  DATE OF DISCHARGE:  01/11/2004                                   CONSULTATION   CONSULTING PHYSICIAN:  Oley Balm. Sung Amabile, M.D. [facility].   REQUESTING PHYSICIAN:  Duke Salvia, M.D.   REASON FOR CONSULTATION:  Hemoptysis.   HISTORY OF PRESENT ILLNESS:  [name] [name] is a 77 year old woman with a  history of cardiomyopathy.  The details of this are not known to me, but she  informs me that it is of unknown cause.  She presents on the day of this  consultation for placement of an AICD/permanent pacemaker.  This was an  elective procedure.  During the course of the procedure there was some  difficulty getting access to the left subclavian vein.  Dr. Graciela Husbands describes  difficulty getting the introducer needle between the clavicle and the first  rib.  He hit her lung, and she shortly thereafter developed hemoptysis.  The  procedure was aborted and a followup chest x-ray was performed to rule out  pneumothorax.  There was no evidence of such.  Subsequent chest x-ray did  show vague left upper lobe infiltrate and he asks me to evaluate further for  this.  At the present time she is alert and oriented with no acute distress.  She denies chest pain.  She has no cough or sputum production.  She has had  no further hemoptysis since the procedure was terminated.   PAST MEDICAL HISTORY:  She denies any significant prior pulmonary history.  No other major medical issues are reported to me.   SOCIAL HISTORY:  She quit smoking in December 2004.   FAMILY HISTORY:  Noncontributory.   REVIEW OF SYSTEMS:  As per the history of present illness and otherwise  noncontributory.   PHYSICAL  EXAMINATION:  GENERAL:  She is alert and well oriented in no acute  cardiac or respiratory distress.  HEAD/NECK:  Without acute abnormalities.  CHEST:  Reveals a normal percussion noted throughout.  There is a faint  wheeze isolated to the right upper lobe.  Otherwise breath sounds are full  without adventitious sounds.  CARDIAC:  Reveals a regular rate and rhythm with no murmurs noted.  ABDOMEN:  Soft, nontender with normal bowel sounds.  EXTREMITIES:  Without clubbing, cyanosis, and edema.  NEUROLOGIC:  Reveals no focal deficits.   DATA:  Chest x-ray after the procedure demonstrates moderate cardiomegaly  with no findings of congestive heart failure.  She does have a vague  infiltrate/opacity in the left upper lobe.   IMPRESSION:  Hemoptysis, after attempted left subclavian vein access.   Clearly the left apex was hit with the needle and I  suspect the opacity on  chest x-ray represents a little area of focal hemorrhage.  There is no  evidence of pneumothorax and no further hemoptysis noted.  This should  resolve completely on its own without intervention.   PLAN/RECOMMENDATIONS:  As discussed with Dr. Graciela Husbands, she is scheduled for  another chest x-ray later this evening and in the morning following this  consultation.  If those chest x-rays look okay, no further intervention is  indicated.  If her chest x-rays look alright, I will not necessarily see her  again, unless requested specifically.  Of course if a pneumothorax develops,  we will need to be alerted of this so that a chest tube can be placed if  indicated.  I would say that she would be okay for discharge to home at any  time presuming that her chest x-ray in the morning of January 11, 2004  shows no evidence of pneumothorax.                                               Oley Balm Sung Amabile, M.D. [facility]    DBS/MEDQ  D:  01/10/2004  T:  01/12/2004  Job:  [id number]   cc:   patient's chart   Duke Salvia, M.D.

## 2010-09-25 NOTE — Assessment & Plan Note (Signed)
[facility]                             PULMONARY OFFICE NOTE   NAME:[name] [name], [name] [name]                MRN:          [id number]  DATE:08/09/2006                            DOB:          10/22/1924    I met Mrs. Fanfan today in follow up for her restless leg syndrome  and obstructive sleep apnea.   With regards to her obstructive sleep apnea, she says that she has not  had any difficulty over the last 2 weeks.  She has been having leaking  from her mask on the sides of her masks.  She tried adjusting the  straps, but has not seemed to have any improvement with this.  She  apparently has not had her equipment checked by her home care company  for the last 6 months and has not had her mask changed since she was  first supplied with the CPAP machine.   With regards to her restless leg syndrome, she is currently taking 3 mg  of Requip at about 6 p.m.  She says that this will start to take effect  at about 7:30 p.m. and will last her through until about 3 in the  afternoon the following day and then between 3 and 6 she has a  significant amount of discomfort in her legs.  She has also been having  some leg cramps.  She says that her symptoms have improved somewhat with  a switch from Cymbalta to Zoloft.  She has not had any other significant  change in her medications.   She has also been complaining of rhinorrhea, mostly at night but  sometimes during the day.   PHYSICAL EXAMINATION:  She is 163 pounds, temperature is 97.8, blood  pressure is 110/62, heart rate is 69, oxygen saturation is 98% on room  air.  HEENT:  There is no sinus tenderness, there is a clear nasal discharge,  there are no oral lesions, no lymphadenopathy.  HEART:  S1, S2.  CHEST:  Clear to auscultation.  ABDOMEN:  Soft, nontender.  EXTREMITIES:  There is no edema.  NEUROLOGIC:  She had 5/5 strength.  She had a negative straight leg  raise and reflexes were  symmetric.   IMPRESSION:  1. Obstructive sleep apnea, currently with mask leak.  I have advised      her to contact her home care company to have her mask fittings      rechecked and she may actually need a new mask.  If she is still      having difficulty after this given her increase in her weight, she      may need to have further adjustments in her pressure setting.  2. Restless leg syndrome.  I would have her increase her Requip to 2      mg b.i.d. with the first dose being at 2:30 p.m. and the second      dose being at 6:30 p.m. to see if this helps to provide her relief      through the day.  I have also instructed her on stretching      exercises.  Additionally, she may need to be switched to an      alternative antidepressant as her Zoloft may be contributing      somewhat to her leg symptoms.  If this is not possible and she is      still symptomatic, then she may need to either have her Requip      changed to an alternative agent or have a additional agent added.  3. Rhinorrhea.  I have suggested to her to try changing the      temperature setting on her humidifier.  I have also given her a      sample of Nasacort AQ and I have advised her to use 2 sprays in      each nostril once daily to see if this provides some relief.   I will follow up with her in approximately 2-3 months.     Coralyn Helling, MD  Electronically Signed    VS/MedQ  DD: 08/09/2006  DT: 08/09/2006  Job #: [id number]   cc:   Gaspar Garbe, M.D.

## 2010-09-25 NOTE — H&P (Signed)
NAME:  [name] [name], [name] [name]         ACCOUNT NO.:  [id number]   MEDICAL RECORD NO.:  [id number]          PATIENT TYPE:  INP   LOCATION:  1823                         FACILITY:  [facility]   PHYSICIAN:  Gaspar Garbe, M.D.DATE OF BIRTH:  1924/07/05   DATE OF ADMISSION:  03/13/2004  DATE OF DISCHARGE:                                HISTORY & PHYSICAL   CHIEF COMPLAINT:  Possible urosepsis.   HISTORY OF PRESENT ILLNESS:  The patient is a 77 year old white female with  nonischemic cardiomyopathy diagnosed in the past year, hospitalization in  March.  She has been doing reasonably well with her congestive heart failure  and had, in fact seen Dr. Patty Sermons earlier this week.  She noticed that she  had been having diarrhea episodes for the past two or three days that she  attributes to a history of taking antibiotics.  From my records, the only  antibiotics that she received was during her colonoscopy back in August with  Dr. Loreta Ave for prophylaxis.  The patient indicates that over the past couple  of days, she has had sweats and chills but not fevers that she has taken her  temperature for and that her bowel movements have increased to approximately  six a day.  She has also noted an increase in her urination and painful  urination as well.  Her symptoms woke her up at approximately 3 this  morning, at which time she called her daughter, notifying her of her  symptoms and the patient was taken by EMS to the hospital where she was  evaluated and found to have a urinary tract infection in the emergency room.  I have been asked to admit this patient for antibiotic management.   ALLERGIES:  No known drug allergies.   MEDICATIONS:  1.  Coumadin 1.25 mg daily.  2.  Coreg 25 mg b.i.d.  3.  Temazepam 15 mg q.h.s. p.r.n. sleep.  4.  Lasix 40 mg q.a.m., 20 mg every noon.  5.  Altace 10 mg p.o. daily.  6.  Potassium chloride 20 mEq p.o. daily.  7.  Zoloft 50 mg daily.   PAST MEDICAL HISTORY:  1.   Congestive heart failure.  2.  Nonischemic cardiomyopathy with an ejection fraction of 15-20% on      Coumadin with an AICD recently placed at the end of August.  3.  History of left pneumothorax status post initial ICD placement attempt      in August.  4.  History of colon polyps.   PAST SURGICAL HISTORY:  The patient has had back surgeries and her ICD in  place implanted January 06, 2004.   SOCIAL HISTORY:  The patient lives in [place] with her husband.  She is a  former Interior and spatial designer.  She is married with two children, a former heavy  smoker, approximately 50 pack years who quit in December of 2004.  Does not  use alcohol.  Mother died of a history of heart problems at age 51.  Mother  died of colon cancer.   REVIEW OF SYSTEMS:  The patient notes currently that she is having chills  and shortness of  breath with increased dyspnea on exertion that began  earlier this week although she was seen in Dr. Yevonne Pax office and  actually seemed to be improved.  She indicates that this is spurious.  Her  other symptoms include dysuria which increased yesterday, some generalized  myalgias and diarrhea without nausea or vomiting.  No blood seen in her  bowel movements but liquid and approximately six times a day.  A 15 point  review of systems is otherwise negative.   ADVANCED DIRECTIVES:  The patient is a full code.   PHYSICAL EXAMINATION:  VITAL SIGNS:  Temperature 99.2, pulse 108,  respiratory rate 22, blood pressure 126/82, sating 92% on 2 liters.  GENERAL:  Ill-appearing but no acute distress.  HEENT:  Normocephalic, atraumatic.  PERRLA.  EOMI.  ENT is within normal  limits.  NECK:  Supple.  No lymphadenopathy or JVD or bruits.  HEART: A regular rate and rhythm without murmurs, rubs or gallops.  LUNGS:  Clear to auscultation bilaterally.  CHEST:  The patient has an ICD implanted beneath her left clavicle and  nontender.  There is no overlying skin changes.  ABDOMEN:  Soft, mildly  tender in the left lower quadrant without rebound  pain.  RECTAL:  Guaiac-negative.  No stool noted in the rectal vault.  EXTREMITIES:  No cyanosis, clubbing or edema.  MUSCULOSKELETAL:  No joint deformities are noted.  NEUROLOGICAL:  Oriented x3.  Cranial nerves II-XII are intact with 1+ deep  tendon reflexes and 5/5 strength bilaterally.   Chest x-ray shows cardiomegaly with a left chest overlying pacer and ICD  leads with normal placement.  EKG shows heart rate of 103, normal sinus  rhythm, widened QRS consistent with her old EKG's.   LABORATORIES:  White count 6.9, hemoglobin 10.8, hematocrit 30.4, platelets  151,000.  BUN and creatinine 24 and 1.6, respectively.  Glucose of 133,  potassium 4.4.  Liver function tests are within normal limits.  CK MB is  less than 1.0.  Troponin less than 0.05 and myoglobin 96.6.  Her INR is 1.9.  Urinalysis shows specific gravity 1.015, pH of 6.5 with white blood cells  too numerous to count, LE moderate, nitrite positive.   ASSESSMENT AND PLAN:  1.  Urinary tract infection, possible urosepsis.  With the patient's having,      she has been having symptoms for at least a week and has not wanted to      bother her family.  She has been given her first dose of IV Cipro in the      emergency room and will be given p.o. Cipro from this point out.  Will      need to follow up blood cultures x2 at least for 48 hours given her      implanted defibrillator to make sure that she does not have sepsis.  2.  Diarrhea.  Question whether this is Clostridium difficile from prior      antibiotics.  She has not had any considerable changes in her      medications other than an increase of her Altace from 5-10 mg.  She      seems to be a bit dehydrated as well because of this.  We will check her      stool for white cells, O&P, C. Difficile and enteric pathogens as well     as treating her empirically with Flagyl on top of her Cipro which ought      to give her  reasonable coverage of her gut and also ought to cover C.      Difficile.  3.  Congestive heart failure.  This appears to be well controlled.  Her BMP      per the emergency room is 164 and I am not certain why she indicates      that she is having worsening dyspnea on exertion although I was unable      to get her up in a walker given her telemetry in the emergency room.  I      will try to urge her activity and assess this over the course of the      day, decrease her Lasix to 20 mg b.i.d. as I believe she is having      considerable insensible water loss from her diarrhea and sweating if she      is, in fact having fevers.  If her BMP rises, may need to re-increase      this.  4.  Coumadin per pharmacy.  Her INR will likely increase given her      antibiotics and start her on 1.5 mg daily to      continue her normal dose as she is just slightly below therapeutic.  5.  Continue her Altace and Coreg as above.  6.  Depression.  Continue her Zoloft with Restoril for evening sleep as      needed.      Rich   RWT/MEDQ  D:  03/13/2004  T:  03/13/2004  Job:  [id number]   cc:   [name], M.D.  [address]  [place]  [place] [address]  Fax: [phone]   Anselmo Rod, M.D.  [address]  [address]  [place]  [place] [address]  Fax: [phone]   Doylene Canning. Ladona Ridgel, M.D.

## 2010-09-25 NOTE — Assessment & Plan Note (Signed)
[facility]                               PULMONARY OFFICE NOTE   NAME:[name] [name], [name] [name]                MRN:          [id number]  DATE:02/28/2006                            DOB:          08/08/1924    HISTORY OF PRESENT ILLNESS:  I saw [name] today in follow up for  her restless leg syndrome and obstructive sleep apnea.  With regards to her  restless leg syndrome, she says she still has difficulty with her legs  approximately once a week.  She is currently on Requip 2 mg at night which  she takes round 6 p.m.  She will go to bed at around 8 p.m. She says  typically she will have no difficulty falling asleep, and she will wake up  once around midnight and then once again around 2 in the morning to use the  bathroom, but then is usually able to fall right back asleep.  She gets up  between 4-5 in the morning.  She says that she feels fairly well rested when  she wakes up.  However, she tries to lay down in the afternoon sometimes,  because she feels this is better for her heart function, but because she has  worsening leg symptoms at this time, she is not able to actually lay down.  She says that maybe once a week she will end up taking an extra dose of the  Requip, and this will allow her to fall asleep.  She apparently was also  recently switched from Zoloft to Cymbalta for her depressive symptoms.  However, she noticed that her leg symptoms may have gotten worse with this  change in medications.  With regards to her sleep apnea, she is currently on  CPAP at 11 using nasal pillows and humidifier.  She appears to be tolerating  this quite well.  She says she will occasionally wake up and find that her  mask is off, but otherwise, does not appear to be having any difficulty  using the mask or the machine.  She also continues to be on ferrous sulfate  with vitamin C.  At this time, I would continue her on her Requip 2 mg at  night and have  advised her that it would be okay to use an additional dose  of the Requip as needed, but I have preferred not to increase her dose on a  regular basis unless her leg symptoms got significantly worse.  I will also  continue her on her ferrous sulfate with vitamin C and have her undergo a  laboratory assessment with ferritin level at her next follow up, and if her  creatinine level is greater than 50, then I would consider discontinuing her  iron supplementation.  With regards to her sleep apnea, she appears to be  doing reasonably well, and I do not feel that any adjustments need to be  made with this at this time.  Additionally, with regards to her restless leg  syndrome, while Cymbalta is not typical SSRI, it may have some effect on her  restless leg syndrome, and if her leg  symptoms become persistently worse,  then consideration could be given to switching her to  an alternative antidepressant such as Wellbutrin.  Otherwise, I would plan  on following up with her in approximately six months.       Coralyn Helling, MD   VS/MedQ DD:  02/28/2006 DT:  03/01/2006 Job #:  [id number]   cc:   Gaspar Garbe, M.D.

## 2010-09-25 NOTE — Discharge Summary (Signed)
NAME:  [name] [name], [name] [name]         ACCOUNT NO.:  [id number]   MEDICAL RECORD NO.:  [id number]          PATIENT TYPE:  INP   LOCATION:  3736                         FACILITY:  [facility]   PHYSICIAN:  Gaspar Garbe, M.D.DATE OF BIRTH:  1924-09-18   DATE OF ADMISSION:  03/13/2004  DATE OF DISCHARGE:  03/16/2004                                 DISCHARGE SUMMARY   DIAGNOSES:  1.  Urinary tract infection.  2.  Diarrhea, not otherwise specified.  3.  Congestive heart failure.  4.  Ischemic cardiomyopathy.  5.  History of left pneumothorax after implantable      cardioverter/defibrillator placement, August 2005.  6.  History of colon polyps.   SUMMARY OF HOSPITAL COURSE:  [name] [name] is a 77 year old white female  well known to me, being her primary care after her initial hospitalization  earlier this year with diagnosis of ischemic cardiomyopathy.  She ame to the  emergency room complaining of fevers over the course of several days and  diarrheal illness.  She was empirically put on Cipro and Flagyl with the  Flagyl discontinued after her Clostridium difficile cultures were negative.  Her enteric pathogens, O&P, and white blood cells were all negative as well,  and her diarrhea spontaneously resolved.  She required Milk of Magnesia for  constipation on Sunday, the day before her discharge.   The patient had blood cultures x2 done in the emergency room and was  supposed to have a urine culture;  however, they did not order this, even  though it was specified in the orders.  Blood cultures x2 revealed one with  gram-positive cocci in clusters and gram-negative rods. Final determination  was not available at the time of this dictation.  Her second set of blood  cultures were negative leading [place] to believe that this was contamination as  the blood cultures have been held over a course of 72 hours.  The patient  continued to remain afebrile during this entire course of her hospital  stay  and did not appear to be septic.  Given that she had clinical improvement  even though vancomycin was added and the patient remained on Cipro, I  believe that she had a urinary tract infection which may have caused her to  be dehydrated along with diarrhea from earlier in the week.  Lasix was  decreased somewhat for the initial point of her hospitalization, and she  continued to do well.  Over the course of the weekend, determination was  made to send the patient home on March 16, 2004, and she was clinically  stable, and the blood culture results were thought to be contamination  related.  The patient was in agreement with this, and subsequent follow-up  appointment was made with Dr. Wylene Simmer on Thursday, March 19, 2004 at  10:30 a.m. to go over the results of her cultures and decide whether she  would need any further antibiotic treatment.   MEDICATIONS:  1.  Coreg 25 mg twice daily.  2.  Restoril 50 mg q.h.s. as needed for sleep.  3.  Lasix 40 mg  q.a.m., 20 mg q.noon.  4.  Altace 10 mg once daily.  5.  Zoloft 50 mg once daily.  6.  Potassium chloride 20 mEq once daily.  7.  Coumadin.  The patient is to take a full 2.5 mg tablet on March 16, 2004 in the p.m. and then resume her 1.25 mg tablets.  8.  Doxycycline 100 mg twice daily.  9.  Cipro 500 mg twice daily.  Both of these were written out for 10 days.   LABORATORIES:  As stated above, blood cultures x2 showed one to be  completely negative with another one with gram-negative rods and gram-  positive cocci in clusters, thought to be contamination.  On the day of  discharge, the patient's INR was 1.6, white count 4.2, hemoglobin 10.2,  platelets 186,000.  She was given 5 mg of Coumadin on the evening of  March 15, 2004.  She will have 2.5 mg of Coumadin on March 16, 2004.  It  should rise rapidly with introduction of p.o. Cipro.  Sodium 140, potassium  3.9, glucose 99, BUN and creatinine 17 and 1.2,  respectively.  BNP 127.5 on  March 15, 2004.  Stool studies including Giardia, Cryptosporidium, white  blood cells, O&P, and Clostridium difficile toxin were all negative.  Urinalysis at admission showed white blood cells too numerous to count.  Despite clarification of orders, urine culture was not done.  No results  available.   CONDITION ON DISCHARGE:  Stable.   INSTRUMENT COUNT:  The patient is to call my office if she develops any  fevers between now and her office visit which is scheduled for Thursday,  March 19, 2004, at 10:30 a.m. at [facility].  She is not  to wait on these symptoms as she has done in the past but to call  immediately and speak either to myself or the doctor on call regarding what  she should do.  I instructed for her to take the doxycycline with a full  glass of water on an empty stomach.  Written instructions on these were  given to the patient prior to her discharge.      Rich   RWT/MEDQ  D:  03/16/2004  T:  03/16/2004  Job:  [id number]

## 2010-09-25 NOTE — H&P (Signed)
NAME:  Totton, Ginette S                   ACCOUNT NO.:  [id number]   MEDICAL RECORD NO.:  [id number]                   PATIENT TYPE:  INP   LOCATION:  0106                                 FACILITY:  [facility]   PHYSICIAN:  Corinna L. Lendell Caprice, MD             DATE OF BIRTH:  [birth year]   DATE OF ADMISSION:  07/26/2003  DATE OF DISCHARGE:                                HISTORY & PHYSICAL   CHIEF COMPLAINT:  Short of breath.   HISTORY OF PRESENT ILLNESS:  [name] [name] is a pleasant 77 year old white  female who just moved here to the area. She has no primary care physician  yet but she presents to the emergency room with dyspnea on exertion which  has been worsening over the past four months. She has also been orthopneic  and had paroxysmal nocturnal dyspnea. She has increased her weight by 15  pounds over four months. Also last night, she woke up in the middle of the  night with left sided chest pressure which radiated to her left arm. She  reports that she thinks all of this started after she had a viral illness in  December.   PAST MEDICAL HISTORY:  None.   MEDICATIONS:  1. Aleve as needed.  2. Calcium with vitamin D.   SOCIAL HISTORY:  The patient is married, she is here with her daughter, she  does not smoke or drink.   FAMILY HISTORY:  Her father died at age 9 of heart problems, her mother  died of colon cancer.   REVIEW OF SYMPTOMS:  As above otherwise systems review is negative.   PHYSICAL EXAMINATION:  VITAL SIGNS:  Temperature is 96.3, blood pressure  130/83, pulse 99, respiratory rate 36.  Oxygen saturation 98%  on room air.  GENERAL:  The patient is well-nourished, well-developed, appears younger  than her stated age. She is dyspneic, was talking.  HEENT:  Normocephalic, atraumatic. Pupils equal round and reactive to light.  Sclera nonicteric. Moist mucous membranes.  NECK:  No thyromegaly, supple, no jugular venous distention.  LUNGS:  Clear to auscultation  without wheezes, rhonchi or rales but she is  tachypneic.  CARDIOVASCULAR:  Regular rate and rhythm without murmur, gallop or rub.  ABDOMEN:  Normal bowel sounds, soft, nontender, nondistended.  GU/RECTAL:  Deferred.  EXTREMITIES:  No cyanosis, clubbing or edema, pulses are intact.  SKIN:  No rash.  NEUROLOGIC:  The patient is alert and oriented. Cranial nerves and sensory  motor exam are intact.  PSYCHIATRIC:  Normal affect.   LABORATORY DATA:  CBC, CMET and cardiac enzymes are all essentially  unremarkable. Her B type natruretic peptide is 722.   EKG shows normal sinus rhythm with left bundle branch block.  Chest x-ray PA  and lateral shows CHF.   ASSESSMENT/PLAN:  1. New onset congestive heart failure.  The patient has received a dose of     Lasix and I will continue this. She will be  admitted to telemetry, I will     rule out myocardial infarction, check a TSH, echocardiogram. She may end     up needing a stress test but certainly her heart failure should be better     compensated prior to proceeding with this.  She also needs a referral to     a local physician and requests an [place] doctor.  2. Transient chest pain, see above.                                               Corinna L. Lendell Caprice, MD    CLS/MEDQ  D:  07/26/2003  T:  07/27/2003  Job:  [id number]

## 2010-09-25 NOTE — Op Note (Signed)
NAME:  [name] [name], [name] [name]                   ACCOUNT NO.:  [id number]   MEDICAL RECORD NO.:  [id number]                   PATIENT TYPE:  INP   LOCATION:  2905                                 FACILITY:  [facility]   PHYSICIAN:  Duke Salvia, M.D.               DATE OF BIRTH:  07/29/1924   DATE OF PROCEDURE:  01/10/2004  DATE OF DISCHARGE:  01/11/2004                                 OPERATIVE REPORT   PREOPERATIVE DIAGNOSIS:  Congestive heart failure class 3 - left bundle  branch block, nonischemic cardiomyopathy.   POSTOPERATIVE DIAGNOSIS:  :  Congestive heart failure class 3 - left bundle branch block, nonischemic  cardiomyopathy.   PROCEDURE:  Aborted biventricular implantable cardiac defibrillator  implantation, complicated by probable pneumothorax.   DESCRIPTION OF PROCEDURE:  :  The patient was brought to the electrophysiology laboratory and after  routine prep and drape and obtaining informed consent, Lidocaine was  infiltrated in the prepectoral subclavicular region and an  incision was made and carried down to the layer of the prepectoral fascia  using electrocautery.  A pocket was formed similarly.  Hemostasis was obtained.   Therefore, attention was turned was turned to gaining access to the  extrathoracic left subclavian vein. The first rib was the target where the  extrathoracic left subclavian vein traverses it.  Its course seemed to be  quite deep, and in any case, I was unable to cannulate the vein over the  first rib.  We ended up doing a venogram, but because I could not be certain  of where I was, I actually went over the second rib and accomplished  venipuncture rather easily.   However, in the process of this, the patient started coughing and actually  coughed up some blood. After discussion with Dr. Billy Fischer from  pulmonary, it was felt that I likely had caused a lung injury secondary to  the needle puncture; hence the blood, and that she was at risk  for  pneumothorax.  Given her compensated heart failure status and the  precariousness that always attends biventricular ICD insertions; it was  elected to abort the procedure.   The routine guide wires were removed, the pocket was copiously irrigated  with antibiotic containing saline solution.  Hemostasis was assured. The  wound was then closed in 3 layers in the normal fashion. The wound was  washed,  dried and Benzoin and Steri-Strip  dressing was applied.  All sponge, instrument and needle counts were correct  at the end of the procedure.   A chest x-ray is pending at this time.                                               Duke Salvia, M.D.    SCK/MEDQ  D:  01/10/2004  T:  01/12/2004  Job:  [id number]   cc:   [name], M.D.  [address]  [place]  [place] [address]  Fax: [phone]

## 2010-09-25 NOTE — Cardiovascular Report (Signed)
NAME:  [name] [name], [name] [name]                   ACCOUNT NO.:  [id number]   MEDICAL RECORD NO.:  [id number]                   PATIENT TYPE:  OUT   LOCATION:  CATH                                 FACILITY:  [facility]   PHYSICIAN:  Armanda Magic, M.D.                  DATE OF BIRTH:  1925/02/21   DATE OF PROCEDURE:  07/29/2003  DATE OF DISCHARGE:                              CARDIAC CATHETERIZATION   PROCEDURE:  1. Left heart catheterization.  2. Coronary angiography.  3. Left ventriculography was not performed due to patient's elevated LVEDP     and congestive heart failure.   OPERATOR:  Armanda Magic, M.D.   INDICATIONS:  Congestive heart failure.   COMPLICATIONS:  Acute anxiety and sudden onset of probable pulmonary edema.   IV ACCESS:  Via right femoral artery with 6 French sheath.   This is a 77 year old female who presented to the emergency room with  congestive heart failure which came on quite suddenly.  She had a viral  illness back in December.  She presented to the emergency room and ruled out  for myocardial infarction, but was noted to have a left bundle branch block.  Chest x-ray showed congestive heart failure.  She now presents for cardiac  catheterization.   The patient is brought to the cardiac catheterization laboratory in a  fasting nonsedated state.  Informed consent was obtained.  The patient was  connected to continuous heart rate and pulse oximetry monitoring,  intermittent blood pressure monitoring.  The right groin was prepped and  draped in a sterile fashion.  1% Xylocaine was used for local anesthesia.  Using modified Seldinger technique, a 6 French sheath was placed in the  right femoral artery.  Under fluoroscopic guidance, a 6 [place] JL-4 catheter  was placed in the left coronary artery.  Multiple cine films were taken at  30-degree RAO, 40-degree LAO views.  The catheter was subselected in the  left circumflex.  Therefore, this catheter was removed over  a guide wire and  a 6 [place] JL-3.5 was engaged in the left coronary ostium.  Multiple cine  films were taken at 30-degree RAO, 40-degree LAO views.  The catheter was  then exchanged over guide wire for 6 [place] JR-4 catheter which was placed  under fluoroscopic guidance in the right coronary artery.  Multiple cine  films were taken in 30-degree RAO, 40-degree LAO views.  The patient then  started complaining of chest pressure and difficulty with getting her breath  and was noted to become tachycardic.  Blood pressure actually rose.  Heart  rates were in the 115-120 range.  It was decided that left ventriculography  would not be performed and that we would just proceed with left heart cath  to measure pressures.  Right coronary artery catheter was removed over guide  wire and a 6 French angled pigtail catheter was placed under fluoroscopic  guidance in the left ventricular cavity and  left ventricular pressure was  measured.  The catheter was then pulled back across the aortic valve with  mild aortic valve gradient noted.  At the end of the procedure, all  catheters and sheaths were removed.  Manual compression was performed until  adequate hemostasis was obtained.  The patient was transferred back to her  room in stable condition.   RESULTS:  Left main coronary artery is widely patent and bifurcates to the  left anterior descending and left circumflex artery.  The LAD artery gives  rise to two very tiny diagonal branches and then there is a 30% narrowing.  It then gives rise to a very large diagonal branch which bifurcates into two  daughter branches.  All of which are widely patent.  The rest of the LAD is  widely patent throughout its course to the apex.   Left circumflex has a __________proximal narrowing and then gives rise to a  very large obtuse marginal branch which is widely patent throughout its  course and gives off four daughter branches.  They are all widely patent.  The  ongoing left circumflex traverses the AV groove and is widely patent.   The right coronary artery is widely patent throughout its course and  bifurcates distally to posterior descending artery and posterior lateral  artery.   Left ventriculography was not performed.  The left ventricular pressure was  measured.  LVEDP was 40 mmHg, aortic pressure 95/53 mmHg, LV 137/33 mmHg.   ASSESSMENT:  1. Congestive heart failure, questionable etiology.  Increased LVEDP of 40     mmHg on cath.  2. Nonobstructive coronary disease.   PLAN:  1. 2-D echocardiogram now to assess LV function.  2. Lasix 40 mg IV now for probable congestive heart failure.  3. Congestive heart failure.  4. Check a chest x-ray.                                               Armanda Magic, M.D.    TT/MEDQ  D:  07/29/2003  T:  07/29/2003  Job:  [id number]

## 2010-09-25 NOTE — Op Note (Signed)
NAME:  [name] [name], [name] [name]                   ACCOUNT NO.:  [id number]   MEDICAL RECORD NO.:  [id number]                   PATIENT TYPE:  OIB   LOCATION:  2853                                 FACILITY:  [facility]   PHYSICIAN:  Duke Salvia, M.D.               DATE OF BIRTH:  07-04-1924   DATE OF PROCEDURE:  01/15/2004  DATE OF DISCHARGE:                                 OPERATIVE REPORT   PREOPERATIVE DIAGNOSIS:  Nonischemic cardiomyopathy, left bundle branch  block, class 3 heart failure.   POSTOPERATIVE DIAGNOSIS:  Nonischemic cardiomyopathy, left bundle branch  block, class 3 heart failure; recurrent chest pain associated with  tachycardia presumably supraventricular tachycardia.   OPERATION PERFORMED:  Biventricular implantable cardioverter-defibrillator  implantation, contrast venography and intraoperative defibrillation  threshold testing.   SURGEON:  Duke Salvia, M.D.   DESCRIPTION OF PROCEDURE:  After the obtaining of informed consent, the  patient was brought to the electrophysiology laboratory and placed on the  fluoroscopic table in supine position.  After routine prep and drape of the  left upper chest, lidocaine was infiltrated along line of the previous  incision and the incision was opened and carried down to the layer of the  previously made pocket using sharp dissection.  Venography was obtained  demonstrating the patency and the course of the extrathoracic left  subclavian vein.  As we previously had pneumothorax over the first rib, we  approached it over the second rib which was accomplished with modest  difficulty and I was only able ultimately to get two wires placed and so I  double wired one to give [place] our three access ports.  Over one of these a 9  French sheath was placed through which was then passed a St. Jude 1581 65 cm  dual coil active fixation defibrillator lead, serial number [id number].  Under  fluoroscopic guidance it was manipulated to the right  ventricular apex where  the bipolar R-wave was 19.8 mV with pacing impedance of 657 ohms, a pacing  threshold of 0.6 V at 0.5 msec, current at threshold was 0.7 mA and there  was no diaphragmatic pacing at 10V.   This lead having been secured, over the more caudal guidewire a 9 French  [place] sheath was placed through which was passed a Land and a Wholey  wire.  This allowed for relatively easy cannulation of the coronary sinus.  Venograms were obtained though they were noninclusive because there appeared  to be an unusual lie of the coronary sinus that was initially quite  horizontal and then became quite vertical.  Venograms were obtained that  demonstrated a high lateral branch.  This was accessed with a Whisper wire;  however, the Pacesetter bipolar lead and a Medtronic unipolar lead could not  be passed past the beginning portion of this vein and these two sites were  abandoned.  Having placed the wire in this vein, however, it became clear  that  the wire actually went all the way through the venous anatomy and came  back out through a very posterior access site to the coronary sinus.  Using  this as a road map, we then cannulated the coronary sinus inferiorly  identifying a branch that coursed out to the posterolateral wall; however,  the ramifications terminated rather quickly in an apical location, thus  preventing [place] from having access to the lateral wall around the tip of the  apex.  At this point it was elected to go back and recannulate the body of  the CS and serendipitously we found a vein that I had thought had existed  earlier but had not been able to confirm, which was on the posterolateral  wall.  We were able then to cannulate this using a Whisper wire and then  pass a Medtronic Unipolar 4193 passive fixation LV lead, serial #[id number].  It was manipulated out onto the lateral wall and then actually a little bit  more laterally still after it made a right angle  bend.  In this location,  the bipolar L-wave was 22 mV with a pacing impedance of 894 ohms and a  threshold of 0.8 volts at 0.5 msec.  There was no diaphragmatic pacing at  10V and current at threshold was 1.0 mA.  Noting that this was the fourth or  fifth venous site attempted, this had taken a considerable length of time.   This lead was then left in position and over the last retained guidewire a 7  French sheath was placed through which was then passed a Pacesetter 1488TC  active fixation atrial lead, serial #[id number], [id number].  It was manipulated to the  right atrial appendage where the bipolar P-wave was 2.6 mV with a pacing  impedance of 658 ohms, a threshold of 1.1 V at 0.5 msec.  Current at  threshold was 1.6 mA.  This lead was secured to the prepectoral fascia and  then the coronary sinus delivery system was removed under fluoroscopic  guidance.  There was no significant displacement of ventricular lead and  reassessment demonstrated stable parameters.  The leads were then attached  to a St. Jude Atlas +HF model V340CRT defibrillator, serial [id number].  Through the device, a bipolar P-wave was __________  impedance of 750 ohms,  a threshold of 0.5 V at 0.5 msec.  The R-wave was greater than 12 mV with  impedance of 570 ohms and threshold also of 0.5 V at 0.5 msec and the LV  impedance was 765 with a threshold of 1V at 0.5 msec.  The high voltage  impedance was 34 ohms.   With these acceptable parameters recorded, defibrillation threshold testing  was undertaken.  Ventricular fibrillation was induced via the T-wave shock.  After a total duration of 6.5 seconds a 20 joule shock was delivered through  a measured resistance of 38 ohms terminating ventricular fibrillation  restoring sinus rhythm.   After waiting five to six minutes, ventricular fibrillation was reinduced  via the T-wave shock.  After a total duration of 6.5 seconds, a 20 joule shock was delivered through a measured  resistance of 38 ohms terminating  ventricular fibrillation restoring sinus rhythm.  At this point the device  was implanted.  The pocket was copiously irrigated with antibiotic  containing saline solution.  Hemostasis was assured.  Surgicel was placed on  the inferior aspect of the pockets given the fact that we had to extend it  and it was somewhat friable from  the fact that we had made the initial  portion of the pocket last week.  The lead   DICTATION ENDED HERE.                                               Duke Salvia, M.D.    SCK/MEDQ  D:  01/15/2004  T:  01/15/2004  Job:  [id number]   cc:   [name], M.D.  [address]  [place]  [place] [address]  Fax: [phone]

## 2010-09-25 NOTE — Assessment & Plan Note (Signed)
[facility]                               PULMONARY OFFICE NOTE   NAME:[name] [name] WIRKKALA                MRN:          [id number]  DATE:11/30/2005                            DOB:          16-Dec-1924    REASON FOR VISIT:  I saw [name] [name] in followup today for her restless  leg syndrome and obstructive sleep apnea.  With regards to her restless leg  syndrome, she says she is having occasional breakthroughs of her symptoms.  She is on Requip 0.25 mg tablets, and she is taking one and a half tablets  at night.  With regards to her sleep apnea, she is on CPAP at 11 and says  that she is having no difficulty using the machine and feels symptomatically  better from it.  Actually, her only difficulty is that she is having  discomfort from the use of the mask.  She says that it gives her a sore over  her nose.  Her home care company has advised her to try using a Band-Aid,  but she is somewhat reluctant to use this as a long-term solution.  Otherwise, she has not had any other significant change in her health or  medications since her last visit.   At this time, what I have advised her to do is increase her dose of Requip  to 0.5 mg q.h.s. for her restless leg syndrome and see if this helps to  control her symptoms.  With regards to her sleep apnea, I have advised her  to try using a different mask type, and we will try to see if she is able to  tolerate using nasal pillows.  My plan right now would be to follow up with  her in about three months.                                   Coralyn Helling, MD   VS/MedQ  DD:  11/30/2005  DT:  11/30/2005  Job #:  [id number]

## 2010-09-25 NOTE — Discharge Summary (Signed)
NAME:  [name] [name], [name] [name]                   ACCOUNT NO.:  [id number]   MEDICAL RECORD NO.:  [id number]                   PATIENT TYPE:  INP   LOCATION:  0372                                 FACILITY:  [facility]   PHYSICIAN:  Deirdre Peer. Polite, M.D.              DATE OF BIRTH:  15-Apr-1925   DATE OF ADMISSION:  07/26/2003  DATE OF DISCHARGE:  07/30/2003                                 DISCHARGE SUMMARY   ADMISSION PHYSICIAN:  Corinna L. Lendell Caprice, MD   DISCHARGING PHYSICIAN:  Armanda Magic, M.D.   CHIEF COMPLAINT AND REASON FOR CONSULTATION AND ADMISSION:  The patient  presented to the ER with complaints of shortness of breath.  This is a 77-  year-old female recently relocated to the [place] area, no primary care  physician.  She presents with complaints of dyspnea on exertion, which has  been progressive over the past four months.  This has been associated with  orthopnea and paroxysmal nocturnal dyspnea.  She reports an increase in  weight of 15 pounds over four months.  The night before coming to the ER the  patient states she awakened in the middle of the night with left side chest  pressure, which radiated down her left arm.  She reports that all of this  symptomatology began after a viral illness that she sustained in December  2004.  On initial exam, the patient was afebrile, BP was 130/83, pulse 99  and regular, respiratory rate 36, O2 sat 98% on room air.  On physical exam,  it was noted that she was dyspneic while talking.  Lungs were clear and  there was no JVD noted.  There were no murmurs, rubs or gallops.  BNP was  slightly elevated at 722.  Cardiac isoenzymes were normal.  Renal function  and creatinine were normal.  White count was normal.  EKG showed a sinus  rhythm with a left bundle branch block.  Chest x-ray, two views,  demonstrated CHF.  The patient was admitted by [facility] with the  following diagnoses:  1. New onset congestive heart failure.  2.  Transient episode of substernal chest pain.   HOSPITAL COURSE:  CHEST PAIN/CHF:  Due to patient'[name] presentation of new CHF,  as well as chest pain with a left bundle branch block, hospital sought  consultation with [facility] cardiologist.  Dr. Mayford Knife evaluated the patient on  July 26, 2003.  Serial cardiac isoenzymes were done and were within normal  limits.  A 2-D echocardiogram was also done.  This demonstrated left  ventricle moderately to markedly dilated. Overall left ventricle systolic  function was severely reduced.  LV EF% was calculated at 10 to 20%.  LV wall  thickness was normal.  There was severe diffuse left ventricular  hypokinesis.  Aortic valve thickness was mildly increased.  Aortic valve was  mildly calcified.  Aortic measurements were otherwise within normal limits  with regards to size.  There was mild  MR.  Left atrial size was normal.  Right ventricular size was normal.  Right ventricular systolic function was  normal.  Upon admission, the patient had also been started on Lasix,  Lopressor and Altace to treat the CHF and assist with diuresis.  Based on  the abnormal findings of the 2-D echo, stress testing was deferred and risks  and benefits of catheterization were explained to the patient per Dr.  Mayford Knife.  The patient wished to proceed and, once patient'[name] CHF had  completely resolved, the patient underwent cardiac catheterization on July 29, 2003 per Dr. Mayford Knife.  This demonstrated the same low EF of 15 to 20%,  elevated left ventricular and diastolic pressures of 40 mmHg, nonobstructive  coronary disease, i.e. idiopathic cardiomyopathy.  The patient was continued  on Lasix.  Altace was added and Lopressor was changed to Coreg per Dr.  Mayford Knife.  Otherwise, the patient tolerated the cardiac catheterization  procedure well.  At midnight on March 22, patient had a 14 beat run of  ventricular tachycardia, was asymptomatic with this.  On a.m. labs, the  patient'[name] potassium  was noted to be slightly diminished at 3.8.  TSH was  normal at 3.4.  Dr.  Mayford Knife did see [name] [name] on the morning of  discharge and determined that the patient had nonsustained ventricular  tachycardia, but at this point there was no indication for an AICD and the  plan is to treat with beta blockers and continue the Coreg as previously  ordered.  Other recommendation from Dr. Mayford Knife include a checking of  ferritin and serum electrophoresis to rule out secondary causes of  idiopathic dilated cardiomyopathy and our plans are at this time to  discharge this patient home.   FINAL DISCHARGE DIAGNOSES:  1. Congestive heart failure, resolved.  2. 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  3. Chest pain with nonobstructive CAD.  4. Left bundle branch block.  5. Nonsustained ventricular tachycardia.   DISCHARGE MEDICATIONS:  1. Altace 5 mg daily.  2. Coreg 3.125 mg b.i.d.  3. Lasix 40 mg daily.  4. K-Dur 20 mEq daily.  5. Aspirin 325 mg daily.  6. Calcium supplement with vitamin D.  7. Patient had previously been taking Aleve prior to admission.  She has     been instructed to avoid Aleve, ibuprofen, Motrin and other type products     due to her problems with heart failure and enlarged heart.   PAIN MANAGEMENT:  As before admission.   ACTIVITY:  No bending, stooping, straining or lifting greater than five  pounds for the next two days.   DIET:  Cardiac with specifics noted regarding low salt intake.   WOUND CARE:  Shower only for the next two days.   SPECIAL INSTRUCTIONS:  The patient has been instructed to purchase a set of  scales.  She is to weigh herself daily at the same time in clothing and she  is to keep a daily record of her weights and to call her physician if she  has a greater than five pound weight gain.   FOLLOWUP APPOINTMENTS:  She is to see Dr. Mayford Knife on April 18 at 2:00 p.m. She also needs to come to the [place] offices on Monday, March 28 to have  a  BMET drawn to follow her BUN, creatinine and potassium since she is on an  ACE inhibitor and a diuretic.   OTHER REMINDERS:  Again, patient will have a serum ferritin and a  serum  electrophoresis drawn prior to discharge and this was recommended by Dr.  Mayford Knife to followup secondary causes of idiopathic cardiomyopathy.      Allison L. Norlene Duel. Polite, M.D.    ALE/MEDQ  D:  07/30/2003  T:  08/01/2003  Job:  [id number]

## 2010-11-09 ENCOUNTER — Encounter: Payer: Self-pay | Admitting: *Deleted

## 2010-11-18 ENCOUNTER — Ambulatory Visit (INDEPENDENT_AMBULATORY_CARE_PROVIDER_SITE_OTHER): Payer: Medicare Other | Admitting: Cardiology

## 2010-11-18 ENCOUNTER — Encounter: Payer: Self-pay | Admitting: Cardiology

## 2010-11-18 VITALS — BP 120/72 | HR 70 | Wt 158.0 lb

## 2010-11-18 DIAGNOSIS — I509 Heart failure, unspecified: Secondary | ICD-10-CM

## 2010-11-18 DIAGNOSIS — I5032 Chronic diastolic (congestive) heart failure: Secondary | ICD-10-CM

## 2010-11-18 NOTE — Progress Notes (Signed)
[name] [name] Date of Birth:  06-17-1924 [facility] / [facility] [address]   [address] [place], [place]  [address] [phone]           Fax   [phone]  HPI: This pleasant 77 year old woman is seen for a six-month followup office visit.  She is now a resident at [facility] in early part.  She lives in a room with her husband who is also elderly and not in good health.  She has a past history of a nonischemic cardiomyopathy.  She has a biventricular defibrillator.Her last echocardiogram on 12/23/09 showed improvement in her systolic function to an ejection fraction of 50-55% and she has impaired relaxation and diastolic dysfunction.  Her echocardiogram showed that her pulmonary artery pressure was down to normal at 33 mm mercury.  She has a past history of supraventricular tachycardia but has had no recent awareness of any arrhythmias.  Her ICD is followed closely by Dr. Graciela Husbands.  The patient had a cardiac catheterization in March 2005 by Dr. Carolanne Grumbling which showed elevated end-diastolic pressure and showed nonobstructive coronary disease.  Patient has a history of renal insufficiency and is followed by Dr. Allena Katz.  She's had a history of gout followed by Dr. Allena Katz and Dr. Wylene Simmer.  Current Outpatient Prescriptions  Medication Sig Dispense Refill  . acetaminophen (TYLENOL) 500 MG tablet Take 500 mg by mouth daily as needed.        [name] allopurinol (ZYLOPRIM) 100 MG tablet Take 100 mg by mouth daily.        . Ascorbic Acid (VITAMIN C) 500 MG tablet Take 500 mg by mouth daily.        [name] aspirin 325 MG tablet Take 325 mg by mouth daily.        . calcium gluconate 500 MG tablet Take 500 mg by mouth 2 (two) times daily.        . carvedilol (COREG) 25 MG tablet Take 12.5 mg by mouth. One tablet twice a day      . colchicine 0.6 MG tablet Take 0.6 mg by mouth 2 (two) times daily.        . ergocalciferol (VITAMIN D2) 50000 UNITS capsule Take 50,000 Units by mouth once a  week.        . fenofibrate (TRICOR) 48 MG tablet Take 48 mg by mouth daily.        . fentaNYL (DURAGESIC - DOSED MCG/HR) 25 MCG/HR Place 1 patch onto the skin every 3 (three) days.        . ferrous sulfate 325 (65 FE) MG tablet Take 325 mg by mouth as directed.        . furosemide (LASIX) 40 MG tablet Take 40 mg by mouth 2 (two) times daily.        [name] HYDROcodone-acetaminophen (VICODIN) 5-500 MG per tablet Take 1 tablet by mouth daily as needed.        [name] levothyroxine (SYNTHROID, LEVOTHROID) 88 MCG tablet Take 88 mcg by mouth daily.        . OxyCODONE HCl, Abuse Deter, 5 MG TABS Take 1 tablet by mouth as needed.        . pantoprazole (PROTONIX) 40 MG tablet Take 40 mg by mouth daily.        . potassium chloride SA (K-DUR,KLOR-CON) 20 MEQ tablet Take 20 mEq by mouth 2 (two) times daily.       . pravastatin (PRAVACHOL) 40 MG tablet Take 40 mg by  mouth daily.        . sertraline (ZOLOFT) 100 MG tablet Take 100 mg by mouth daily.        [name] tiotropium (SPIRIVA) 18 MCG inhalation capsule Place 18 mcg into inhaler and inhale daily.          No Known Allergies  Patient Active Problem List  Diagnoses  . HYPOKALEMIA  . OBSTRUCTIVE SLEEP APNEA  . RESTLESS LEG SYNDROME  . ATRIAL FIBRILLATION  . Chronic diastolic heart failure  . RHINITIS  . BRONCHITIS, CHRONIC  . PALPITATIONS  . CHEST PAIN  . left ventricular lead-diaphragmatic stimulation-inactivated  . ICD (implantable cardiac defibrillator), biventricular, LV lead off    History  Smoking status  . Former Smoker -- 1.0 packs/day  . Types: Cigarettes  . Quit date: 04/10/2003  Smokeless tobacco  . Not on file    History  Alcohol Use No    Family History  Problem Relation Age of Onset  . Heart disease Mother   . Colon cancer Father     Review of Systems: The patient denies any heat or cold intolerance.  No weight gain or weight loss.  The patient denies headaches or blurry vision.  There is no cough or sputum production.  The  patient denies dizziness.  There is no hematuria or hematochezia.  The patient denies any muscle aches or arthritis.  The patient denies any rash.  The patient denies frequent falling or instability.  There is no history of depression or anxiety.  All other systems were reviewed and are negative.   Physical Exam: Filed Vitals:   11/18/10 0906  BP: 120/72  Pulse: 70   The general appearance reveals a well-developed well-nourished woman in no distress.  She is accompanied by her daughter who brought her to the office.Pupils equal and reactive.   Extraocular Movements are full.  There is no scleral icterus.  The mouth and pharynx are normal.  The neck is supple.  The carotids reveal no bruits.  The jugular venous pressure is normal.  The thyroid is not enlarged.  There is no lymphadenopathy.The chest is clear to percussion and auscultation. There are no rales or rhonchi. Expansion of the chest is symmetrical.The precordium is quiet.  The first heart sound is normal.  The second heart sound is physiologically split.  There is no murmur gallop rub or click.  There is no abnormal lift or heave.The abdomen is soft and nontender. Bowel sounds are normal. The liver and spleen are not enlarged. There Are no abdominal masses. There are no bruits.  Extremities showed trace ankle edema and no phlebitis.Strength is normal and symmetrical in all extremities.  There is no lateralizing weakness.  There are no sensory deficits.  No acute gout.   Assessment / Plan: Continue on same medication.  Recheck in 6 months.

## 2010-11-18 NOTE — Assessment & Plan Note (Signed)
The patient has been doing well since last visit from a cardiac standpoint.  She denies any significant shortness of breath.  She has not been experiencing any symptoms of congestive heart failure.  She has occasional edema of her legs which responded to staying in bed with her legs elevated for several days.  She's not having any chest pain or angina.

## 2010-11-26 ENCOUNTER — Other Ambulatory Visit: Payer: Self-pay | Admitting: Cardiovascular Disease

## 2010-12-17 ENCOUNTER — Encounter: Payer: Medicare Other | Admitting: *Deleted

## 2010-12-17 ENCOUNTER — Ambulatory Visit (INDEPENDENT_AMBULATORY_CARE_PROVIDER_SITE_OTHER): Payer: Medicare Other | Admitting: *Deleted

## 2010-12-17 DIAGNOSIS — I4891 Unspecified atrial fibrillation: Secondary | ICD-10-CM

## 2010-12-17 DIAGNOSIS — I428 Other cardiomyopathies: Secondary | ICD-10-CM

## 2010-12-17 DIAGNOSIS — Z95810 Presence of automatic (implantable) cardiac defibrillator: Secondary | ICD-10-CM

## 2010-12-18 ENCOUNTER — Encounter: Payer: Self-pay | Admitting: *Deleted

## 2011-01-06 ENCOUNTER — Other Ambulatory Visit: Payer: Self-pay | Admitting: Internal Medicine

## 2011-01-06 LAB — REMOTE ICD DEVICE
BAMS-0001: 150 {beats}/min
BRDY-0004RV: 120 {beats}/min
RV LEAD AMPLITUDE: 11.7 mv
RV LEAD THRESHOLD: 0.625 V
TZAT-0001FASTVT: 1
TZAT-0012FASTVT: 200 ms
TZAT-0013FASTVT: 3
TZAT-0019FASTVT: 7.5 V
TZAT-0020FASTVT: 1 ms
TZON-0004FASTVT: 24
TZON-0004SLOWVT: 50
TZON-0005FASTVT: 6
TZON-0005SLOWVT: 6
TZST-0001FASTVT: 2
TZST-0001FASTVT: 4
TZST-0001FASTVT: 5
TZST-0003FASTVT: 20 J
TZST-0003FASTVT: 30 J
TZST-0003FASTVT: 40 J

## 2011-01-18 NOTE — Progress Notes (Signed)
icd remote check  

## 2011-01-22 ENCOUNTER — Telehealth: Payer: Self-pay | Admitting: *Deleted

## 2011-01-22 DIAGNOSIS — T82198A Other mechanical complication of other cardiac electronic device, initial encounter: Secondary | ICD-10-CM

## 2011-01-22 NOTE — Telephone Encounter (Signed)
Checking lead 

## 2011-02-04 ENCOUNTER — Encounter: Payer: Self-pay | Admitting: *Deleted

## 2011-02-05 LAB — COMPREHENSIVE METABOLIC PANEL
ALT: 13
ALT: 14
ALT: 15
ALT: 15
AST: 17
AST: 17
Albumin: 2.7 — ABNORMAL LOW
Albumin: 3.1 — ABNORMAL LOW
Alkaline Phosphatase: 61
Alkaline Phosphatase: 66
BUN: 35 — ABNORMAL HIGH
BUN: 43 — ABNORMAL HIGH
BUN: 46 — ABNORMAL HIGH
CO2: 25
CO2: 26
CO2: 27
CO2: 29
Calcium: 8.9
Calcium: 9.1
Chloride: 101
Chloride: 102
Chloride: 106
Chloride: 106
Creatinine, Ser: 1.93 — ABNORMAL HIGH
Creatinine, Ser: 2.25 — ABNORMAL HIGH
Creatinine, Ser: 2.41 — ABNORMAL HIGH
GFR calc Af Amer: 25 — ABNORMAL LOW
GFR calc Af Amer: 30 — ABNORMAL LOW
GFR calc non Af Amer: 19 — ABNORMAL LOW
GFR calc non Af Amer: 24 — ABNORMAL LOW
GFR calc non Af Amer: 25 — ABNORMAL LOW
Glucose, Bld: 108 — ABNORMAL HIGH
Glucose, Bld: 110 — ABNORMAL HIGH
Glucose, Bld: 112 — ABNORMAL HIGH
Glucose, Bld: 92
Potassium: 4.3
Potassium: 5.1
Sodium: 136
Sodium: 138
Sodium: 140
Total Bilirubin: 0.6
Total Bilirubin: 0.6
Total Bilirubin: 0.7
Total Protein: 6.6

## 2011-02-05 LAB — CBC
HCT: 29.2 — ABNORMAL LOW
HCT: 29.7 — ABNORMAL LOW
HCT: 31.1 — ABNORMAL LOW
HCT: 31.3 — ABNORMAL LOW
Hemoglobin: 10 — ABNORMAL LOW
Hemoglobin: 10 — ABNORMAL LOW
Hemoglobin: 10.7 — ABNORMAL LOW
Hemoglobin: 9.9 — ABNORMAL LOW
MCHC: 33.1
MCHC: 33.5
MCHC: 34.2
MCHC: 34.2
MCHC: 34.3
MCV: 92
MCV: 92.3
MCV: 92.9
MCV: 93.7
Platelets: 227
Platelets: 229
Platelets: 260
RBC: 2.98 — ABNORMAL LOW
RBC: 3.15 — ABNORMAL LOW
RBC: 3.16 — ABNORMAL LOW
RBC: 3.41 — ABNORMAL LOW
RDW: 15
RDW: 15.1
WBC: 4.4
WBC: 4.5
WBC: 9.1

## 2011-02-05 LAB — DIFFERENTIAL
Basophils Absolute: 0
Basophils Absolute: 0
Basophils Absolute: 0
Basophils Relative: 0
Basophils Relative: 0
Basophils Relative: 0
Eosinophils Absolute: 0
Eosinophils Absolute: 0
Eosinophils Absolute: 0.1
Eosinophils Absolute: 0.2
Eosinophils Absolute: 0.2
Eosinophils Relative: 1
Eosinophils Relative: 5
Eosinophils Relative: 5
Lymphocytes Relative: 14
Lymphocytes Relative: 15
Lymphs Abs: 0.6 — ABNORMAL LOW
Lymphs Abs: 0.7
Monocytes Absolute: 0.5
Monocytes Absolute: 0.6
Monocytes Relative: 7
Monocytes Relative: 7
Neutro Abs: 6.3
Neutro Abs: 7.6
Neutrophils Relative %: 70
Neutrophils Relative %: 82 — ABNORMAL HIGH
Neutrophils Relative %: 87 — ABNORMAL HIGH
Neutrophils Relative %: 89 — ABNORMAL HIGH

## 2011-02-05 LAB — POCT CARDIAC MARKERS
CKMB, poc: 1.9
Troponin i, poc: <0.05

## 2011-02-05 LAB — PROTIME-INR
INR: 2.1 — ABNORMAL HIGH
INR: 2.1 — ABNORMAL HIGH
INR: 2.6 — ABNORMAL HIGH
Prothrombin Time: 24.6 — ABNORMAL HIGH
Prothrombin Time: 29.5 — ABNORMAL HIGH

## 2011-02-05 LAB — B-NATRIURETIC PEPTIDE (CONVERTED LAB)
Pro B Natriuretic peptide (BNP): 113 — ABNORMAL HIGH
Pro B Natriuretic peptide (BNP): 117 — ABNORMAL HIGH

## 2011-02-05 LAB — POCT I-STAT, CHEM 8
BUN: 36 — ABNORMAL HIGH
Creatinine, Ser: 2.1 — ABNORMAL HIGH
Sodium: 139
TCO2: 26

## 2011-02-05 LAB — DIGOXIN LEVEL: Digoxin Level: 1.2

## 2011-02-05 LAB — FERRITIN: Ferritin: 119 (ref 10–291)

## 2011-02-05 LAB — FOLATE: Folate: >20

## 2011-02-25 ENCOUNTER — Encounter: Payer: Self-pay | Admitting: Internal Medicine

## 2011-03-01 ENCOUNTER — Encounter: Payer: Self-pay | Admitting: Internal Medicine

## 2011-03-04 ENCOUNTER — Ambulatory Visit (INDEPENDENT_AMBULATORY_CARE_PROVIDER_SITE_OTHER): Payer: Medicare Other | Admitting: Internal Medicine

## 2011-03-04 ENCOUNTER — Encounter: Payer: Self-pay | Admitting: Internal Medicine

## 2011-03-04 DIAGNOSIS — Z1211 Encounter for screening for malignant neoplasm of colon: Secondary | ICD-10-CM

## 2011-03-04 DIAGNOSIS — R1310 Dysphagia, unspecified: Secondary | ICD-10-CM | POA: Insufficient documentation

## 2011-03-04 NOTE — Progress Notes (Addendum)
Subjective:    Patient ID: [name] [name], female    DOB: 05-Dec-1924, 77 y.o.   MRN: [id number]  HPI [name] [name] is an 77 yo female with a PMH of atrial for ablation, chronic diastolic heart failure status post ICD placement, chronic bronchitis, and obstructive sleep apnea who is seen in consultation at the request of Dr. Wylene Simmer for evaluation of dysphagia. She is accompanied today by her daughter.  The patient reports that she feels like food is sticking in her throat. She reports this is worse with solids, particularly breads and meats. She is having no trouble with swallowing liquids. She also denies coughing with drinking fluids. She also recalls approximately 2 or 3 weeks ago the feeling of a pill being stuck in her throat for several hours. She reports she takes multiple medications a day, and has switched to taking her medications with milk, and she feels this is more effective for her. She also reports trying to chew her food very well, but recently has new dentures, which is not completely comfortable with yet. She does report some water brash but denies heartburn. She denies odynophagia. Overall she feels that her dysphasia symptoms have been going on for approximately one month. She did report this to her PCP and her pantoprazole was increased from 40 mg daily to twice a day.  With this she's not been able to tell much difference. She was also advised to use TUMS when necessary, but also she hasn't found much relief with this medication. She denies nausea and vomiting. She reports her appetite is okay and there is no known weight loss. She reports normal bowel movements and denies diarrhea and constipation. She denies bright red blood per rectum, hematochezia or melena. No fevers chills or night sweats.  The patient recalls prior barium enema and colonoscopy, but she does not remember when this test was done. She does not have any recollection of prior upper endoscopy.  Review of  Systems Constitutional: Negative for fever, chills, night sweats, activity change, appetite change and unexpected weight change.  Positive for some fatigue HEENT: Negative for sore throat, mouth sores, positive for hearing problems Eyes: Negative for visual disturbance Respiratory: Negative for cough, chest tightness and shortness of breath Cardiovascular: Negative for chest pain, palpitations and lower extremity swelling Gastrointestinal: See history of present illness Genitourinary: Negative for dysuria and hematuria. Musculoskeletal: Positive for chronic arthritis Skin: Negative for rash or color change Neurological: Negative for headaches, weakness, numbness, daughter notes intermittent confusion Hematological: Negative for adenopathy, negative for easy bruising/bleeding Psychiatric/behavioral: Negative for depressed mood, negative for anxiety   Patient Active Problem List  Diagnoses  . HYPOKALEMIA  . OBSTRUCTIVE SLEEP APNEA  . RESTLESS LEG SYNDROME  . ATRIAL FIBRILLATION  . Chronic diastolic heart failure  . RHINITIS  . BRONCHITIS, CHRONIC  . PALPITATIONS  . CHEST PAIN  . left ventricular lead-diaphragmatic stimulation-inactivated  . ICD (implantable cardiac defibrillator), biventricular, LV lead off   Current Outpatient Prescriptions  Medication Sig Dispense Refill  . acetaminophen (TYLENOL) 500 MG tablet Take 500 mg by mouth daily as needed.        [name] allopurinol (ZYLOPRIM) 100 MG tablet Take 100 mg by mouth daily.        . Ascorbic Acid (VITAMIN C) 500 MG tablet Take 500 mg by mouth daily.        [name] aspirin 325 MG tablet Take 325 mg by mouth daily.        . calcium carbonate (TUMS -  DOSED IN MG ELEMENTAL CALCIUM) 500 MG chewable tablet Chew 1 tablet by mouth 3 (three) times daily.        . calcium-vitamin D (OSCAL-500) 500-400 MG-UNIT per tablet Take 1 tablet by mouth 2 (two) times daily.        . carvedilol (COREG) 12.5 MG tablet Take 12.5 mg by mouth 2 (two) times daily  with a meal.        . colchicine 0.6 MG tablet Take 0.6 mg by mouth daily.       . diphenhydramine-acetaminophen (TYLENOL PM) 25-500 MG TABS Take 2 tablets by mouth at bedtime as needed.        . ergocalciferol (VITAMIN D2) 50000 UNITS capsule Take 50,000 Units by mouth once a week.        . fenofibrate (TRICOR) 48 MG tablet Take 48 mg by mouth daily.        . fentaNYL (DURAGESIC - DOSED MCG/HR) 25 MCG/HR Place 1 patch onto the skin every 3 (three) days.        . ferrous sulfate 325 (65 FE) MG tablet Take 325 mg by mouth as directed.        . furosemide (LASIX) 40 MG tablet Take 40 mg by mouth 2 (two) times daily.        [name] HYDROcodone-acetaminophen (VICODIN) 5-500 MG per tablet Take 1 tablet by mouth every 6 (six) hours as needed.       [name] levothyroxine (SYNTHROID, LEVOTHROID) 88 MCG tablet Take 88 mcg by mouth daily.        . OxyCODONE HCl, Abuse Deter, 5 MG TABS Take 1 tablet by mouth as needed.        . pantoprazole (PROTONIX) 40 MG tablet        . potassium chloride SA (K-DUR,KLOR-CON) 20 MEQ tablet Take 20 mEq by mouth 2 (two) times daily.       . pravastatin (PRAVACHOL) 40 MG tablet Take 40 mg by mouth daily.        . sertraline (ZOLOFT) 100 MG tablet Take 100 mg by mouth daily.        [name] tiotropium (SPIRIVA) 18 MCG inhalation capsule Place 18 mcg into inhaler and inhale daily.         No Known Allergies  Family History  Problem Relation Age of Onset  . Heart disease Mother   . Colon cancer Mother   . Colon polyps Mother   . Emphysema Father   . Liver disease Brother   . Kidney disease Mother   . Kidney disease Maternal Grandmother    Social History  . Marital Status: Married, husband died in 10/15/10   Social History Main Topics  . Smoking status: Former Smoker -- 1.0 packs/day    Types: Cigarettes    Quit date: 04/10/2003  . Smokeless tobacco: Never Used  . Alcohol Use: No  . Drug Use: No       Objective:   Physical Exam BP 120/68  Pulse 80  Ht 5\' 6"  (1.676 m)  Wt 168  lb 12.8 oz (76.567 kg)  BMI 27.24 kg/m2 Constitutional: Well-developed and well-nourished. No distress. HEENT: Normocephalic and atraumatic. Oropharynx is clear and moist. No oropharyngeal exudate. Conjunctivae are normal. Pupils are equal round and reactive to light. No scleral icterus. Upper and lower dentures Neck: Neck supple. Trachea midline. Cardiovascular: Normal rate, regular rhythm and intact distal pulses. Pacemaker noted left upper chest Pulmonary/chest: Effort normal and breath sounds normal. No wheezing, rales or rhonchi. Abdominal:  Soft, nontender, nondistended. Bowel sounds active throughout. There are no masses palpable. No hepatosplenomegaly. Extremities: Trace pretibial edema with mild changes of chronic venous stasis noted bilaterally, no clubbing or cyanosis Lymphadenopathy: No cervical adenopathy noted. Neurological: Alert and oriented to person place and time. Skin: Skin is warm and dry. No rashes noted. Psychiatric: Normal mood and affect. Behavior is normal.     Assessment & Plan:  77 yo female with a PMH of atrial for ablation, chronic diastolic heart failure status post ICD placement, chronic bronchitis, and obstructive sleep apnea who is seen in consultation at the request of Dr. Wylene Simmer for evaluation of dysphagia.  1. Dysphagia -- the patient's symptoms of dysphagia overall a relatively short lived, being only a month or so in duration. The differential diagnosis at present includes stricture/ring, pill induced esophagitis, reflux esophagitis, esophageal dysmotility, or less likely mass/tumor. She is on twice a day PPI at present and therefore acid reflux is very unlikely. We have extensively discussed the workup including endoscopy and its associated risks/benefits. We discussed the possibility of esophageal dilation should a stricture/ring be found. We also discussed barium swallow, which is less invasive but a very good test for diagnosis. After thorough discussion,  we have decided to proceed with barium swallow with tablet. She is aware that should a stricture be found, the recommendation would be for upper endoscopy, and both she and her daughter are willing to proceed. It EGD is necessary I will like to do this at the hospital with propofol sedation. I have recommended she continue pantoprazole twice daily for now. Further recommendations will be made once barium swallow results are available.  2. CRC screening -- we have discussed colorectal cancer screening, and it does seem that she has had this in the past. We discussed current guidelines and that normally screening is discontinued after age 20. We also discussed that should symptoms warrant, colonoscopy could be considered in the future, but this would only be in the setting of symptoms. Right now she is asymptomatic with no alarm symptoms, and therefore colonoscopy will not be performed. Both she and her daughter understand and agree with this plan.  The patient's daughter, Elliot Cousin, can be reached on her cell phone at [phone]  Addendum: Records received regarding colonoscopy done by Dr. Loreta Ave on 12/16/2003 Impression-small internal hemorrhoids, mucosal bridge with question pseudopolyps and pseudo-diverticular formation in the rectosigmoid with multiple small polyps biopsied. 2 polyps snared from 50 cm. Otherwise unremarkable Pathology: Hyperplastic polyps (both jars) no adenomatous change or malignancy identified.

## 2011-03-04 NOTE — Patient Instructions (Addendum)
You have been scheduled for a barium swallow at the [facility] Radiology Department  October 31 at 9:30am, arrival time of 9:15am.  Please continue to take your Protonix as directed.   A follow up appointment will be determined after the procedure is completed

## 2011-03-10 ENCOUNTER — Ambulatory Visit (HOSPITAL_COMMUNITY)
Admission: RE | Admit: 2011-03-10 | Discharge: 2011-03-10 | Disposition: A | Discharge status: Home / Self Care | Payer: Medicare Other | Source: Ambulatory Visit | Attending: Internal Medicine | Admitting: Internal Medicine

## 2011-03-10 DIAGNOSIS — Q398 Other congenital malformations of esophagus: Secondary | ICD-10-CM | POA: Insufficient documentation

## 2011-03-10 DIAGNOSIS — R1310 Dysphagia, unspecified: Secondary | ICD-10-CM | POA: Insufficient documentation

## 2011-03-10 DIAGNOSIS — K228 Other specified diseases of esophagus: Secondary | ICD-10-CM | POA: Insufficient documentation

## 2011-03-10 DIAGNOSIS — K449 Diaphragmatic hernia without obstruction or gangrene: Secondary | ICD-10-CM | POA: Insufficient documentation

## 2011-03-10 DIAGNOSIS — K2289 Other specified disease of esophagus: Secondary | ICD-10-CM | POA: Insufficient documentation

## 2011-03-15 ENCOUNTER — Telehealth: Payer: Self-pay | Admitting: Internal Medicine

## 2011-03-15 NOTE — Telephone Encounter (Signed)
lmom for [name] [name] stating it usually takes 10 days to 2 weeks to get results back; she may call if needed.

## 2011-03-23 ENCOUNTER — Telehealth: Payer: Self-pay | Admitting: *Deleted

## 2011-03-23 DIAGNOSIS — K224 Dyskinesia of esophagus: Secondary | ICD-10-CM

## 2011-03-23 NOTE — Telephone Encounter (Signed)
Spoke with pt's daughter [name] and informed her of Dr Lauro Franklin findings and suggestions. She is at the assisted living place now and will discuss this with her mom and call me tomorrow. Dr Rhea Belton, daughter wanted to know the type of sedation. Thanks.

## 2011-03-23 NOTE — Telephone Encounter (Signed)
Message copied by Florene Glen on Tue Mar 23, 2011  5:36 PM ------      Message from: Beverley Fiedler      Created: Tue Mar 23, 2011  2:30 PM       Barium swallow reviewed.      Pt has esophageal dysmotility which likely contributes to her dysphagia.      Also she has a tortuous lower esophagus, which also can lead to dysphagia.      Finally, she has a smooth (which likely means benign) narrowing near the esophageal-stomach junction.  She may benefit from dilation of this area.      We discussed this in clinic with the pt and her daughter.      They wanted to think about EGD.  At this point she may indeed benefit from EGD, therefore if she is willnig to proceed, she can be scheduled for EGD with dilation.  This will need to be done at [facility] as an outpt.        ASA will need to be held for 5 days if she elects to proceed.      If she would like this can be discussed with me in clinic before EGD.      Thanks.

## 2011-03-23 NOTE — Telephone Encounter (Signed)
Likely MAC at [facility] (propofol under anesthesia's guidance)

## 2011-03-24 ENCOUNTER — Telehealth: Payer: Self-pay | Admitting: Internal Medicine

## 2011-03-24 NOTE — Telephone Encounter (Signed)
Graciella Freer, RN 03/24/2011 5:23 PM Signed  Spoke with [name] who stated they will go ahead and have the EGD, informed her this will be done at [facility] the week after Thanksgiving and I will call her tomorrow.

## 2011-03-24 NOTE — Telephone Encounter (Signed)
Spoke with [name] [name] who stated they will go ahead and have the EGD, informed her this will be done at [facility] the week after Thanksgiving and I will call her tomorrow.

## 2011-03-24 NOTE — Telephone Encounter (Signed)
See call from 03/23/11.

## 2011-03-25 ENCOUNTER — Encounter: Payer: Self-pay | Admitting: Internal Medicine

## 2011-03-25 ENCOUNTER — Other Ambulatory Visit: Payer: Self-pay | Admitting: Internal Medicine

## 2011-03-25 ENCOUNTER — Encounter: Payer: Self-pay | Admitting: *Deleted

## 2011-03-25 NOTE — Telephone Encounter (Signed)
Informed [name] of EGD with possible dilation at [facility] 04/13/11, be there at 08;30am; mailed prep instructions. [name] stated understanding.

## 2011-03-25 NOTE — Telephone Encounter (Signed)
Opened in error

## 2011-04-06 ENCOUNTER — Telehealth: Payer: Self-pay | Admitting: Internal Medicine

## 2011-04-06 NOTE — Telephone Encounter (Signed)
Spoke with daughter and I will have signed orders faxed to her assisted living facility.

## 2011-04-06 NOTE — Telephone Encounter (Signed)
Notified [name] I faxed signed orders to the facility; she verbalized understanding.

## 2011-04-08 ENCOUNTER — Ambulatory Visit (INDEPENDENT_AMBULATORY_CARE_PROVIDER_SITE_OTHER): Payer: Medicare Other | Admitting: *Deleted

## 2011-04-08 DIAGNOSIS — I428 Other cardiomyopathies: Secondary | ICD-10-CM

## 2011-04-08 DIAGNOSIS — I4891 Unspecified atrial fibrillation: Secondary | ICD-10-CM

## 2011-04-09 ENCOUNTER — Other Ambulatory Visit: Payer: Self-pay | Admitting: Internal Medicine

## 2011-04-09 ENCOUNTER — Encounter: Payer: Self-pay | Admitting: Internal Medicine

## 2011-04-09 LAB — REMOTE ICD DEVICE
AL AMPLITUDE: 2.9 mv
ATRIAL PACING ICD: 1 pct
DEVICE MODEL ICD: 752544
HV IMPEDENCE: 46 Ohm
RV LEAD AMPLITUDE: 11.9 mv
RV LEAD IMPEDENCE ICD: 550 Ohm
TZAT-0001FASTVT: 1
TZAT-0004FASTVT: 8
TZAT-0018FASTVT: NEGATIVE
TZON-0003FASTVT: 375 ms
TZON-0003SLOWVT: 425 ms
TZON-0010FASTVT: 80 ms
TZON-0010SLOWVT: 80 ms
TZST-0001FASTVT: 2
TZST-0001FASTVT: 3
TZST-0001FASTVT: 5
TZST-0003FASTVT: 20 J
TZST-0003FASTVT: 40 J
TZST-0003FASTVT: 40 J

## 2011-04-13 ENCOUNTER — Ambulatory Visit (HOSPITAL_COMMUNITY)
Admission: RE | Admit: 2011-04-13 | Discharge: 2011-04-13 | Disposition: A | Discharge status: Home / Self Care | Payer: Medicare Other | Source: Ambulatory Visit | Attending: Internal Medicine | Admitting: Internal Medicine

## 2011-04-13 ENCOUNTER — Encounter (HOSPITAL_COMMUNITY): Payer: Self-pay | Admitting: Anesthesiology

## 2011-04-13 ENCOUNTER — Encounter (HOSPITAL_COMMUNITY)
Admission: RE | Disposition: A | Discharge status: Home / Self Care | Payer: Self-pay | Source: Ambulatory Visit | Attending: Internal Medicine

## 2011-04-13 ENCOUNTER — Other Ambulatory Visit: Payer: Self-pay | Admitting: Internal Medicine

## 2011-04-13 ENCOUNTER — Encounter (HOSPITAL_COMMUNITY): Payer: Self-pay | Admitting: *Deleted

## 2011-04-13 DIAGNOSIS — K2960 Other gastritis without bleeding: Secondary | ICD-10-CM | POA: Insufficient documentation

## 2011-04-13 DIAGNOSIS — K449 Diaphragmatic hernia without obstruction or gangrene: Secondary | ICD-10-CM | POA: Insufficient documentation

## 2011-04-13 DIAGNOSIS — R1310 Dysphagia, unspecified: Secondary | ICD-10-CM | POA: Insufficient documentation

## 2011-04-13 DIAGNOSIS — K2289 Other specified disease of esophagus: Secondary | ICD-10-CM | POA: Insufficient documentation

## 2011-04-13 DIAGNOSIS — K228 Other specified diseases of esophagus: Secondary | ICD-10-CM | POA: Insufficient documentation

## 2011-04-13 DIAGNOSIS — K2940 Chronic atrophic gastritis without bleeding: Secondary | ICD-10-CM | POA: Insufficient documentation

## 2011-04-13 DIAGNOSIS — K219 Gastro-esophageal reflux disease without esophagitis: Secondary | ICD-10-CM

## 2011-04-13 HISTORY — PX: ESOPHAGOGASTRODUODENOSCOPY: SHX5428

## 2011-04-13 SURGERY — EGD (ESOPHAGOGASTRODUODENOSCOPY)
Anesthesia: Moderate Sedation

## 2011-04-13 MED ORDER — FENTANYL CITRATE 0.05 MG/ML IJ SOLN
INTRAMUSCULAR | Status: AC
  Filled 2011-04-13: qty 2

## 2011-04-13 MED ORDER — PANTOPRAZOLE SODIUM 40 MG PO TBEC: 40.0000 mg | DELAYED_RELEASE_TABLET | Freq: Two times a day (BID) | ORAL | Status: DC

## 2011-04-13 MED ORDER — DIPHENHYDRAMINE HCL 50 MG/ML IJ SOLN
INTRAMUSCULAR | Status: AC
  Filled 2011-04-13: qty 1

## 2011-04-13 MED ORDER — BUTAMBEN-TETRACAINE-BENZOCAINE 2-2-14 % EX AERO
INHALATION_SPRAY | CUTANEOUS | Status: DC | PRN
  Administered 2011-04-13: 2 via TOPICAL

## 2011-04-13 MED ORDER — MIDAZOLAM HCL 10 MG/2ML IJ SOLN
INTRAMUSCULAR | Status: DC | PRN
  Administered 2011-04-13: 2 mg via INTRAVENOUS
  Administered 2011-04-13: 1 mg via INTRAVENOUS

## 2011-04-13 MED ORDER — FENTANYL NICU IV SYRINGE 50 MCG/ML
INJECTION | INTRAMUSCULAR | Status: DC | PRN
  Administered 2011-04-13: 25 ug via INTRAVENOUS
  Administered 2011-04-13: 12.5 ug via INTRAVENOUS

## 2011-04-13 MED ORDER — MIDAZOLAM HCL 10 MG/2ML IJ SOLN
INTRAMUSCULAR | Status: AC
  Filled 2011-04-13: qty 2

## 2011-04-13 NOTE — Progress Notes (Signed)
Remote icd check  

## 2011-04-13 NOTE — H&P (Signed)
Note taken from office visit on 03/04/11. It has been reviewed.  HPI  [name] [name] is an 77 yo female with a PMH of atrial for ablation, chronic diastolic heart failure status post ICD placement, chronic bronchitis, and obstructive sleep apnea who is seen in consultation at the request of Dr. Wylene Simmer for evaluation of dysphagia. She is accompanied today by her daughter. The patient reports that she feels like food is sticking in her throat. She reports this is worse with solids, particularly breads and meats. She is having no trouble with swallowing liquids. She also denies coughing with drinking fluids. She also recalls approximately 2 or 3 weeks ago the feeling of a pill being stuck in her throat for several hours. She reports she takes multiple medications a day, and has switched to taking her medications with milk, and she feels this is more effective for her. She also reports trying to chew her food very well, but recently has new dentures, which is not completely comfortable with yet. She does report some water brash but denies heartburn. She denies odynophagia. Overall she feels that her dysphasia symptoms have been going on for approximately one month. She did report this to her PCP and her pantoprazole was increased from 40 mg daily to twice a day. With this she's not been able to tell much difference. She was also advised to use TUMS when necessary, but also she hasn't found much relief with this medication. She denies nausea and vomiting. She reports her appetite is okay and there is no known weight loss. She reports normal bowel movements and denies diarrhea and constipation. She denies bright red blood per rectum, hematochezia or melena. No fevers chills or night sweats.  The patient recalls prior barium enema and colonoscopy, but she does not remember when this test was done. She does not have any recollection of prior upper endoscopy.  Review of Systems  Constitutional: Negative for fever,  chills, night sweats, activity change, appetite change and unexpected weight change. Positive for some fatigue  HEENT: Negative for sore throat, mouth sores, positive for hearing problems  Eyes: Negative for visual disturbance  Respiratory: Negative for cough, chest tightness and shortness of breath  Cardiovascular: Negative for chest pain, palpitations and lower extremity swelling  Gastrointestinal: See history of present illness  Genitourinary: Negative for dysuria and hematuria.  Musculoskeletal: Positive for chronic arthritis  Skin: Negative for rash or color change  Neurological: Negative for headaches, weakness, numbness, daughter notes intermittent confusion  Hematological: Negative for adenopathy, negative for easy bruising/bleeding  Psychiatric/behavioral: Negative for depressed mood, negative for anxiety  Patient Active Problem List   Diagnoses   .  HYPOKALEMIA   .  OBSTRUCTIVE SLEEP APNEA   .  RESTLESS LEG SYNDROME   .  ATRIAL FIBRILLATION   .  Chronic diastolic heart failure   .  RHINITIS   .  BRONCHITIS, CHRONIC   .  PALPITATIONS   .  CHEST PAIN   .  left ventricular lead-diaphragmatic stimulation-inactivated   .  ICD (implantable cardiac defibrillator), biventricular, LV lead off    Current Outpatient Prescriptions   Medication  Sig  Dispense  Refill   .  acetaminophen (TYLENOL) 500 MG tablet  Take 500 mg by mouth daily as needed.     [name]  allopurinol (ZYLOPRIM) 100 MG tablet  Take 100 mg by mouth daily.     .  Ascorbic Acid (VITAMIN C) 500 MG tablet  Take 500 mg by mouth daily.     [name]  aspirin 325 MG tablet  Take 325 mg by mouth daily.     .  calcium carbonate (TUMS - DOSED IN MG ELEMENTAL CALCIUM) 500 MG chewable tablet  Chew 1 tablet by mouth 3 (three) times daily.     .  calcium-vitamin D (OSCAL-500) 500-400 MG-UNIT per tablet  Take 1 tablet by mouth 2 (two) times daily.     .  carvedilol (COREG) 12.5 MG tablet  Take 12.5 mg by mouth 2 (two) times daily with a meal.      .  colchicine 0.6 MG tablet  Take 0.6 mg by mouth daily.     .  diphenhydramine-acetaminophen (TYLENOL PM) 25-500 MG TABS  Take 2 tablets by mouth at bedtime as needed.     .  ergocalciferol (VITAMIN D2) 50000 UNITS capsule  Take 50,000 Units by mouth once a week.     .  fenofibrate (TRICOR) 48 MG tablet  Take 48 mg by mouth daily.     .  fentaNYL (DURAGESIC - DOSED MCG/HR) 25 MCG/HR  Place 1 patch onto the skin every 3 (three) days.     .  ferrous sulfate 325 (65 FE) MG tablet  Take 325 mg by mouth as directed.     .  furosemide (LASIX) 40 MG tablet  Take 40 mg by mouth 2 (two) times daily.     [name]  HYDROcodone-acetaminophen (VICODIN) 5-500 MG per tablet  Take 1 tablet by mouth every 6 (six) hours as needed.     [name]  levothyroxine (SYNTHROID, LEVOTHROID) 88 MCG tablet  Take 88 mcg by mouth daily.     .  OxyCODONE HCl, Abuse Deter, 5 MG TABS  Take 1 tablet by mouth as needed.     .  pantoprazole (PROTONIX) 40 MG tablet      .  potassium chloride SA (K-DUR,KLOR-CON) 20 MEQ tablet  Take 20 mEq by mouth 2 (two) times daily.     .  pravastatin (PRAVACHOL) 40 MG tablet  Take 40 mg by mouth daily.     .  sertraline (ZOLOFT) 100 MG tablet  Take 100 mg by mouth daily.     [name]  tiotropium (SPIRIVA) 18 MCG inhalation capsule  Place 18 mcg into inhaler and inhale daily.      No Known Allergies  Family History   Problem  Relation  Age of Onset   .  Heart disease  Mother    .  Colon cancer  Mother    .  Colon polyps  Mother    .  Emphysema  Father    .  Liver disease  Brother    .  Kidney disease  Mother    .  Kidney disease  Maternal Grandmother     Social History   .  Marital Status:  Married, husband died in 09-26-2010    Social History Main Topics   .  Smoking status:  Former Smoker -- 1.0 packs/day     Types:  Cigarettes     Quit date:  04/10/2003   .  Smokeless tobacco:  Never Used   .  Alcohol Use:  No   .  Drug Use:  No    Objective:   Physical Exam  BP 120/68  Pulse 80  Ht 5\' 6"  (1.676  m)  Wt 168 lb 12.8 oz (76.567 kg)  BMI 27.24 kg/m2  Constitutional: Well-developed and well-nourished. No distress.  HEENT: Normocephalic and atraumatic. Oropharynx is clear and moist. No oropharyngeal exudate.  Conjunctivae are normal. Pupils are equal round and reactive to light. No scleral icterus. Upper and lower dentures  Neck: Neck supple. Trachea midline.  Cardiovascular: Normal rate, regular rhythm and intact distal pulses. Pacemaker noted left upper chest  Pulmonary/chest: Effort normal and breath sounds normal. No wheezing, rales or rhonchi.  Abdominal: Soft, nontender, nondistended. Bowel sounds active throughout. There are no masses palpable. No hepatosplenomegaly.  Extremities: Trace pretibial edema with mild changes of chronic venous stasis noted bilaterally, no clubbing or cyanosis  Lymphadenopathy: No cervical adenopathy noted.  Neurological: Alert and oriented to person place and time.  Skin: Skin is warm and dry. No rashes noted.  Psychiatric: Normal mood and affect. Behavior is normal.   Assessment & Plan:   77 yo female with a PMH of atrial for ablation, chronic diastolic heart failure status post ICD placement, chronic bronchitis, and obstructive sleep apnea who is seen in consultation at the request of Dr. Wylene Simmer for evaluation of dysphagia.  1. Dysphagia -- the patient's symptoms of dysphagia overall a relatively short lived, being only a month or so in duration. The differential diagnosis at present includes stricture/ring, pill induced esophagitis, reflux esophagitis, esophageal dysmotility, or less likely mass/tumor. She is on twice a day PPI at present and therefore acid reflux is very unlikely. We have extensively discussed the workup including endoscopy and its associated risks/benefits. We discussed the possibility of esophageal dilation should a stricture/ring be found. We also discussed barium swallow, which is less invasive but a very good test for diagnosis. After  thorough discussion, we have decided to proceed with barium swallow with tablet. She is aware that should a stricture be found, the recommendation would be for upper endoscopy, and both she and her daughter are willing to proceed. It EGD is necessary I will like to do this at the hospital with propofol sedation. I have recommended she continue pantoprazole twice daily for now. Further recommendations will be made once barium swallow results are available.  2. CRC screening -- we have discussed colorectal cancer screening, and it does seem that she has had this in the past. We discussed current guidelines and that normally screening is discontinued after age 64. We also discussed that should symptoms warrant, colonoscopy could be considered in the future, but this would only be in the setting of symptoms. Right now she is asymptomatic with no alarm symptoms, and therefore colonoscopy will not be performed. Both she and her daughter understand and agree with this plan.  The patient's daughter, [name] [name], can be reached on her cell phone at [phone]  Addendum:  Records received regarding colonoscopy done by Dr. Loreta Ave on 12/16/2003  Impression-small internal hemorrhoids, mucosal bridge with question pseudopolyps and pseudo-diverticular formation in the rectosigmoid with multiple small polyps biopsied. 2 polyps snared from 50 cm. Otherwise unremarkable  Pathology: Hyperplastic polyps (both jars) no adenomatous change or malignancy identified.   The recent office H and P (dated 03/04/11) was reviewed, the patient was examined and there is no change in the patients condition since that H&P was completed. ASA has been held x 5 days.   Huan Pollok M  04/13/2011, 10:58 AM

## 2011-04-13 NOTE — Brief Op Note (Signed)
ENDOSCOPIC IMPRESSION:  1) Presbyesophagus with tortuous distal esophagus.   2) Possible mild stricture at the gastroesophageal junction.  Dilated to 15 mm with TTS balloon.      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  Biopsies performed and sent to pathology.   5) Mild gastritis in the body and the antrum of the stomach. Biopsies performed and sent to pathology.   6) Normal duodenum   RECOMMENDATIONS:  1) Avoid NSAIDS.  Would hold Aspirin an additional 24 hours and then restart.   2) Await pathology results   3) Anti-reflux regimen to be followed   4) Clear liquids until 6 pm, then soft foods rest of day. Resume prior diet tomorrow. 5) Increase pantoprazole (Protonix) to 40 mg twice daily. 6) Office follow-up in 4-6 weeks to assess response.

## 2011-04-14 ENCOUNTER — Encounter (HOSPITAL_COMMUNITY): Payer: Self-pay | Admitting: Internal Medicine

## 2011-04-15 ENCOUNTER — Other Ambulatory Visit: Payer: Self-pay | Admitting: *Deleted

## 2011-04-15 ENCOUNTER — Encounter: Payer: Self-pay | Admitting: *Deleted

## 2011-04-15 DIAGNOSIS — T82110A Breakdown (mechanical) of cardiac electrode, initial encounter: Secondary | ICD-10-CM

## 2011-04-16 ENCOUNTER — Encounter: Payer: Self-pay | Admitting: Internal Medicine

## 2011-04-19 ENCOUNTER — Ambulatory Visit (INDEPENDENT_AMBULATORY_CARE_PROVIDER_SITE_OTHER)
Admission: RE | Admit: 2011-04-19 | Discharge: 2011-04-19 | Disposition: A | Discharge status: Home / Self Care | Payer: Medicare Other | Source: Ambulatory Visit | Attending: Internal Medicine | Admitting: Internal Medicine

## 2011-04-19 ENCOUNTER — Encounter: Payer: Self-pay | Admitting: Cardiology

## 2011-04-19 ENCOUNTER — Ambulatory Visit (INDEPENDENT_AMBULATORY_CARE_PROVIDER_SITE_OTHER): Payer: Medicare Other | Admitting: Cardiology

## 2011-04-19 VITALS — BP 128/70 | HR 78 | Ht 66.0 in | Wt 171.0 lb

## 2011-04-19 DIAGNOSIS — I5032 Chronic diastolic (congestive) heart failure: Secondary | ICD-10-CM

## 2011-04-19 DIAGNOSIS — I4891 Unspecified atrial fibrillation: Secondary | ICD-10-CM

## 2011-04-19 DIAGNOSIS — I509 Heart failure, unspecified: Secondary | ICD-10-CM

## 2011-04-19 DIAGNOSIS — E7800 Pure hypercholesterolemia, unspecified: Secondary | ICD-10-CM

## 2011-04-19 DIAGNOSIS — T82198A Other mechanical complication of other cardiac electronic device, initial encounter: Secondary | ICD-10-CM

## 2011-04-19 DIAGNOSIS — I447 Left bundle-branch block, unspecified: Secondary | ICD-10-CM

## 2011-04-19 DIAGNOSIS — T82110A Breakdown (mechanical) of cardiac electrode, initial encounter: Secondary | ICD-10-CM

## 2011-04-19 NOTE — Patient Instructions (Signed)
Watch carbohydrate and starch intake Your physician recommends that you continue on your current medications as directed. Please refer to the Current Medication list given to you today. Your physician wants you to follow-up in: 6 months You will receive a reminder letter in the mail two months in advance. If you don't receive a letter, please call our office to schedule the follow-up appointment.

## 2011-04-19 NOTE — Assessment & Plan Note (Signed)
The patient denies any symptoms of palpitations or tachycardia.

## 2011-04-19 NOTE — Progress Notes (Signed)
[name] [name] Date of Birth:  Jan 17, 1925 [facility] [address]   [address] [place], [place]  [address] [phone]           Fax   [phone]  History of Present Illness: This pleasant 44 woman seen for a scheduled followup office visit.  He has a history of a nonischemic dilated cardiomyopathy.  She has a defibrillator in place and is followed by Dr. Graciela Husbands..  At a past history of compensated chronic systolic congestive heart failure.  She also has a past history of renal insufficiency and is followed by Dr. Allena Katz of nephrology.  Since last visit she has been doing reasonably well.  She's been eating well and her weight is up 13 pounds since last visit.  Is not having any chest pain or shortness of breath.  Her appetite has been good.  She had a recent GI workup for dysphagia and was found to have presbyesophagus and apparently had a small stricture dilated as well  Current Outpatient Prescriptions  Medication Sig Dispense Refill  . acetaminophen (TYLENOL) 500 MG tablet Take 500 mg by mouth daily as needed.        [name] allopurinol (ZYLOPRIM) 100 MG tablet Take 100 mg by mouth daily.        . Ascorbic Acid (VITAMIN C) 500 MG tablet Take 500 mg by mouth daily.        [name] aspirin 325 MG tablet Take 325 mg by mouth daily.        . calcium carbonate (TUMS - DOSED IN MG ELEMENTAL CALCIUM) 500 MG chewable tablet Chew 1 tablet by mouth 3 (three) times daily.        . calcium-vitamin D (OSCAL-500) 500-400 MG-UNIT per tablet Take 1 tablet by mouth 2 (two) times daily.        . carvedilol (COREG) 12.5 MG tablet Take 12.5 mg by mouth 2 (two) times daily with a meal.        . colchicine 0.6 MG tablet Take 0.6 mg by mouth daily.       . diphenhydramine-acetaminophen (TYLENOL PM) 25-500 MG TABS Take 2 tablets by mouth at bedtime as needed.        . ergocalciferol (VITAMIN D2) 50000 UNITS capsule Take 50,000 Units by mouth once a week.        . fenofibrate (TRICOR) 48 MG  tablet Take 48 mg by mouth daily.        . fentaNYL (DURAGESIC - DOSED MCG/HR) 25 MCG/HR Place 1 patch onto the skin every 3 (three) days.        . ferrous sulfate 325 (65 FE) MG tablet Take 325 mg by mouth as directed. Taking bid      . furosemide (LASIX) 40 MG tablet Take 40 mg by mouth 2 (two) times daily.        [name] HYDROcodone-acetaminophen (VICODIN) 5-500 MG per tablet Take 1 tablet by mouth every 6 (six) hours as needed.       [name] levothyroxine (SYNTHROID, LEVOTHROID) 88 MCG tablet Take 88 mcg by mouth daily.        . OxyCODONE HCl, Abuse Deter, 5 MG TABS Take 1 tablet by mouth as needed.        . pantoprazole (PROTONIX) 40 MG tablet Take 1 tablet (40 mg total) by mouth 2 (two) times daily before a meal.  60 tablet  2  . potassium chloride SA (K-DUR,KLOR-CON) 20 MEQ tablet Take 20 mEq by mouth  2 (two) times daily.       . pravastatin (PRAVACHOL) 40 MG tablet Take 40 mg by mouth daily.        . sertraline (ZOLOFT) 100 MG tablet Take 100 mg by mouth daily.        [name] tiotropium (SPIRIVA) 18 MCG inhalation capsule Place 18 mcg into inhaler and inhale daily.          No Known Allergies  Patient Active Problem List  Diagnoses  . HYPOKALEMIA  . OBSTRUCTIVE SLEEP APNEA  . RESTLESS LEG SYNDROME  . ATRIAL FIBRILLATION  . Chronic diastolic heart failure  . RHINITIS  . BRONCHITIS, CHRONIC  . PALPITATIONS  . CHEST PAIN  . left ventricular lead-diaphragmatic stimulation-inactivated  . ICD (implantable cardiac defibrillator), biventricular, LV lead off  . Dysphagia    History  Smoking status  . Former Smoker -- 1.0 packs/day  . Types: Cigarettes  . Quit date: 04/10/2003  Smokeless tobacco  . Never Used    History  Alcohol Use No    Family History  Problem Relation Age of Onset  . Heart disease Mother   . Colon cancer Mother   . Colon polyps Mother   . Emphysema Father   . Liver disease Brother   . Kidney disease Mother   . Kidney disease Maternal Grandmother     Review of  Systems: Constitutional: no fever chills diaphoresis or fatigue or change in weight.  Head and neck: no hearing loss, no epistaxis, no photophobia or visual disturbance. Respiratory: No cough, shortness of breath or wheezing. Cardiovascular: No chest pain peripheral edema, palpitations. Gastrointestinal: No abdominal distention, no abdominal pain, no change in bowel habits hematochezia or melena. Genitourinary: No dysuria, no frequency, no urgency, no nocturia. Musculoskeletal:No arthralgias, no back pain, no gait disturbance or myalgias. Neurological: No dizziness, no headaches, no numbness, no seizures, no syncope, no weakness, no tremors. Hematologic: No lymphadenopathy, no easy bruising. Psychiatric: No confusion, no hallucinations, no sleep disturbance.    Physical Exam: Filed Vitals:   04/19/11 1532  BP: 128/70  Pulse: 78   General appearance reveals a well-developed well-nourished elderly woman in no distress.  Pupils equal and reactive.   Extraocular Movements are full.  There is no scleral icterus.  The mouth and pharynx are normal.  The neck is supple.  The carotids reveal no bruits.  The jugular venous pressure is normal.  The thyroid is not enlarged.  There is no lymphadenopathy.  The chest is clear to percussion and auscultation. There are no rales or rhonchi. Expansion of the chest is symmetrical.  The precordium is quiet.  The first heart sound is normal.  The second heart sound is physiologically split.  There is no murmur gallop rub or click.  There is no abnormal lift or heave.  The abdomen is soft and nontender. Bowel sounds are normal. The liver and spleen are not enlarged. There Are no abdominal masses. There are no bruits.  Strength is reveal chronic edema of the right leg.The skin is warm and dry.  There is no rash. Strength is normal and symmetrical in all extremities.  There is no lateralizing weakness.  There are no sensory deficits.    Assessment /  Plan: EKG today shows normal sinus rhythm at 78 per minute and is unchanged since 04/13/10 The patient is to try to lose weight by cutting back on carbohydrates and starches and using smaller portions at the nursing home.  Recheck in 6 months for office visit

## 2011-04-19 NOTE — Assessment & Plan Note (Signed)
No active symptoms from her chronic diastolic and systolic congestive heart failure

## 2011-05-03 ENCOUNTER — Encounter: Payer: Self-pay | Admitting: Internal Medicine

## 2011-05-12 ENCOUNTER — Encounter: Payer: Self-pay | Admitting: Internal Medicine

## 2011-05-17 ENCOUNTER — Ambulatory Visit: Payer: Medicare Other | Admitting: Cardiology

## 2011-05-18 ENCOUNTER — Telehealth: Payer: Self-pay | Admitting: Internal Medicine

## 2011-05-18 NOTE — Telephone Encounter (Signed)
Spoke with pt's daughter, [name] [name] who stated pt is doing much better; no choking sensations and she follows her diet closely. She states pt is having a lot of back issues and is back and forth at the md's ofc  There is also a lot of sickness at the assisted living home. Informed [name] [name] it's ok to cancel and she may call us for further problems. Pt may pick up something here d/t the cold, flu and pneumonia in the community.

## 2011-05-19 ENCOUNTER — Ambulatory Visit: Payer: Medicare Other | Admitting: Internal Medicine

## 2011-07-15 ENCOUNTER — Encounter: Payer: Self-pay | Admitting: Internal Medicine

## 2011-07-15 ENCOUNTER — Ambulatory Visit (INDEPENDENT_AMBULATORY_CARE_PROVIDER_SITE_OTHER): Payer: Medicare Other | Admitting: *Deleted

## 2011-07-15 DIAGNOSIS — I4891 Unspecified atrial fibrillation: Secondary | ICD-10-CM

## 2011-07-15 DIAGNOSIS — Z95810 Presence of automatic (implantable) cardiac defibrillator: Secondary | ICD-10-CM

## 2011-07-15 DIAGNOSIS — I509 Heart failure, unspecified: Secondary | ICD-10-CM

## 2011-07-15 DIAGNOSIS — I428 Other cardiomyopathies: Secondary | ICD-10-CM

## 2011-07-15 DIAGNOSIS — I5032 Chronic diastolic (congestive) heart failure: Secondary | ICD-10-CM

## 2011-07-16 LAB — REMOTE ICD DEVICE
AL AMPLITUDE: 2.1 mv
HV IMPEDENCE: 45 Ohm
RV LEAD IMPEDENCE ICD: 550 Ohm
TZAT-0001FASTVT: 1
TZAT-0018FASTVT: NEGATIVE
TZAT-0019FASTVT: 7.5 V
TZON-0003FASTVT: 375 ms
TZON-0003SLOWVT: 425 ms
TZON-0004FASTVT: 24
TZON-0004SLOWVT: 50
TZON-0005SLOWVT: 6
TZON-0010FASTVT: 80 ms
TZON-0010SLOWVT: 80 ms
TZST-0001FASTVT: 2
TZST-0001FASTVT: 4
TZST-0003FASTVT: 20 J
TZST-0003FASTVT: 40 J
VENTRICULAR PACING ICD: 1 pct

## 2011-07-21 NOTE — Progress Notes (Signed)
Remote defib w/icm  

## 2011-07-27 ENCOUNTER — Encounter: Payer: Self-pay | Admitting: *Deleted

## 2011-08-02 ENCOUNTER — Other Ambulatory Visit: Payer: Self-pay | Admitting: Internal Medicine

## 2011-08-03 ENCOUNTER — Ambulatory Visit
Admission: RE | Admit: 2011-08-03 | Discharge: 2011-08-03 | Disposition: A | Discharge status: Home / Self Care | Payer: Medicare Other | Source: Ambulatory Visit | Attending: Internal Medicine | Admitting: Internal Medicine

## 2011-09-28 ENCOUNTER — Encounter: Payer: Medicare Other | Admitting: Internal Medicine

## 2011-10-05 ENCOUNTER — Other Ambulatory Visit: Payer: Self-pay | Admitting: Gastroenterology

## 2011-10-14 ENCOUNTER — Ambulatory Visit (INDEPENDENT_AMBULATORY_CARE_PROVIDER_SITE_OTHER): Payer: Medicare Other | Admitting: Internal Medicine

## 2011-10-14 ENCOUNTER — Encounter: Payer: Self-pay | Admitting: Internal Medicine

## 2011-10-14 VITALS — BP 126/80 | HR 72 | Ht 66.0 in | Wt 170.0 lb

## 2011-10-14 DIAGNOSIS — I5032 Chronic diastolic (congestive) heart failure: Secondary | ICD-10-CM

## 2011-10-14 DIAGNOSIS — I4891 Unspecified atrial fibrillation: Secondary | ICD-10-CM

## 2011-10-14 DIAGNOSIS — Z95810 Presence of automatic (implantable) cardiac defibrillator: Secondary | ICD-10-CM

## 2011-10-14 DIAGNOSIS — N19 Unspecified kidney failure: Secondary | ICD-10-CM

## 2011-10-14 LAB — ICD DEVICE OBSERVATION
AL THRESHOLD: 1 V
DEVICE MODEL ICD: 752544
FVT: 0
HV IMPEDENCE: 49 Ohm
MODE SWITCH EPISODES: 0
PACEART VT: 0
RV LEAD AMPLITUDE: 11.9 mv
RV LEAD IMPEDENCE ICD: 387.5 Ohm
TOT-0009: 0
TOT-0010: 8
TZAT-0001FASTVT: 1
TZON-0003SLOWVT: 425 ms
TZON-0010FASTVT: 80 ms
TZON-0010SLOWVT: 80 ms
TZST-0001FASTVT: 2
TZST-0003FASTVT: 20 J
TZST-0003FASTVT: 40 J

## 2011-10-14 LAB — BASIC METABOLIC PANEL
Calcium: 10.2 mg/dL (ref 8.4–10.5)
Chloride: 103 mEq/L (ref 96–112)
Creatinine, Ser: 1.9 mg/dL — ABNORMAL HIGH (ref 0.4–1.2)

## 2011-10-14 MED ORDER — FUROSEMIDE 40 MG PO TABS: ORAL_TABLET | ORAL | Status: DC

## 2011-10-14 MED ORDER — FUROSEMIDE 40 MG PO TABS: 40.0000 mg | ORAL_TABLET | Freq: Two times a day (BID) | ORAL | Status: DC

## 2011-10-14 NOTE — Progress Notes (Signed)
HPI  [name] [name] is a 78 y.o. female  seen in followup for nonischemic cardiomyopathy with congestive heart failure for which she underwent CRT-D implantation in 2005 with device generator replacement in Dec 2010;  Following generator replacement she has noted diaphragmatic stimulation prompting inactivation of LV pacing. This had not been noticed with the prior device. Assessment of left ventricular function by echo August 2011 had demonstrated late normalization of left ventricular function   We have no laboratories available on hemoglobin renal function in the last 2 or 3 years.  She is currently in a residential facility. She has significant shortness of breath with exertion. She has 3 pillow orthopnea and peripheral edema right greater than left.     Past Medical History  Diagnosis Date  . Atrial fibrillation   . BRONCHITIS, CHRONIC   . CHEST PAIN   . HYPOKALEMIA   . OBSTRUCTIVE SLEEP APNEA   . Palpitations   . SYSTOLIC HEART FAILURE, CHRONIC   . Depression   . CPAP (continuous positive airway pressure) dependence   . ACE-inhibitor cough   . Cardiac defibrillator in situ   . Chronic renal insufficiency   . GERD (gastroesophageal reflux disease)   . Hypothyroidism   . Hematuria   . Osteoarthritis (arthritis due to wear and tear of joints)   . Colon polyps   . Chronic rhinitis   . RLS (restless legs syndrome)   . Chronic back pain   . Arthritis   . Glaucoma   . Sleep apnea     Past Surgical History  Procedure Date  . Back surgery   . Cardiac defibrillator placement 2005     [employer] JUDE Model [phone]  . Skin cancer excision   . Cardiac catheterization 07/29/2003     Congestive heart failure, questionable etiology.  Increased LVEDP of on cath --  Nonobstructive coronary disease --         . Insert / replace / remove pacemaker 01/10/2004    Aborted biventricular implantable cardiac defibrillator implantation, complicated by probable pneumothorax  --   Duke Salvia, M.D.  . Atrial cardiac pacemaker insertion 01/15/2004    St. Jude 1581 65 cm dual coil active fixation defibrillator lead, serial number [id number] --  St. Jude Atlas +HF model V340CRT defibrillator, serial [id number]. -- Biventricular implantable cardioverter-defibrillator implantation, contrast venography and intraoperative defibrillation threshold testing -- SURGEON:  Duke Salvia, M.D.  . Cardiac pacemaker placement 03/17/2009    generator change  --  Unify St. Jude ICD model [id number], serial number [id number]  -- Duke Salvia, MD, [facility]   . Femur fracture surgery   . Esophagogastroduodenoscopy 04/13/2011    Procedure: ESOPHAGOGASTRODUODENOSCOPY (EGD);  Surgeon: Erick Blinks, MD;  Location: [facility];  Service: Gastroenterology;  Laterality: N/A;  may need dilation    Current Outpatient Prescriptions  Medication Sig Dispense Refill  . acetaminophen (TYLENOL) 500 MG tablet Take 500 mg by mouth daily as needed.        [name] allopurinol (ZYLOPRIM) 100 MG tablet Take 100 mg by mouth daily.        . Ascorbic Acid (VITAMIN C) 500 MG tablet Take 500 mg by mouth daily.        [name] aspirin 325 MG tablet Take 325 mg by mouth daily.        . calcium carbonate (TUMS - DOSED IN MG ELEMENTAL CALCIUM) 500 MG chewable tablet Chew 1 tablet by mouth 3 (three) times daily.        [name]  calcium-vitamin D (OSCAL-500) 500-400 MG-UNIT per tablet Take 1 tablet by mouth 2 (two) times daily.        . carvedilol (COREG) 12.5 MG tablet Take 12.5 mg by mouth 2 (two) times daily with a meal.        . colchicine 0.6 MG tablet Take 0.6 mg by mouth daily.       . diphenhydramine-acetaminophen (TYLENOL PM) 25-500 MG TABS Take 2 tablets by mouth at bedtime as needed.        . ergocalciferol (VITAMIN D2) 50000 UNITS capsule Take 50,000 Units by mouth once a week.        . fenofibrate (TRICOR) 48 MG tablet Take 48 mg by mouth daily.        . fentaNYL (DURAGESIC - DOSED MCG/HR) 25 MCG/HR Place 1 patch onto the skin every 3  (three) days.        . ferrous sulfate 325 (65 FE) MG tablet Take 325 mg by mouth as directed. Taking bid      . furosemide (LASIX) 40 MG tablet Take 40 mg by mouth 2 (two) times daily.        [name] HYDROcodone-acetaminophen (VICODIN) 5-500 MG per tablet Take 1 tablet by mouth every 6 (six) hours as needed.       [name] levothyroxine (SYNTHROID, LEVOTHROID) 88 MCG tablet Take 88 mcg by mouth daily.        . OxyCODONE HCl, Abuse Deter, 5 MG TABS Take 1 tablet by mouth as needed.        . pantoprazole (PROTONIX) 40 MG tablet Take 1 tablet (40 mg total) by mouth 2 (two) times daily before a meal.  60 tablet  2  . polyvinyl alcohol (ARTIFICIAL TEARS) 1.4 % ophthalmic solution 1 drop as needed.      . potassium chloride SA (K-DUR,KLOR-CON) 20 MEQ tablet Take 20 mEq by mouth 2 (two) times daily.       . pravastatin (PRAVACHOL) 40 MG tablet Take 40 mg by mouth daily.        . sertraline (ZOLOFT) 100 MG tablet Take 100 mg by mouth daily.        [name] tiotropium (SPIRIVA) 18 MCG inhalation capsule Place 18 mcg into inhaler and inhale daily.          No Known Allergies  Review of Systems negative except from HPI and PMH  Physical Exam BP 126/80  Pulse 72  Ht 5\' 6"  (1.676 m)  Wt 170 lb (77.111 kg)  BMI 27.44 kg/m2  SpO2 95% Well developed and well nourished in no acute distress HENT normal E scleral and icterus clear Neck Supple JVP 8 carotids brisk and full Clear to ausculation Regular rate and rhythm, no murmurs gallops or rub Soft with active bowel sounds No clubbing cyanosis 2+ Edema Alert and oriented, grossly normal motor and sensory function Skin Warm and Dry    Assessment and  Plan

## 2011-10-14 NOTE — Assessment & Plan Note (Signed)
i suspect this is part of her dyspnea and we will increase her diuretic although with some trepidation given the creatinine obtained in February of this year of 2.4  She is to see Dr. TB in just a couple of weeks and so we will repeat  Hgb 14.1   metabolic profile today and it can be checked again when she sees him in 2 weeks' time

## 2011-10-14 NOTE — Assessment & Plan Note (Addendum)
She sees Dr Allena Katz. Creatinine was 2.4 which likely represents a creatinine clearance of about 20-25 cc  I will try andget records

## 2011-10-14 NOTE — Patient Instructions (Addendum)
Remote monitoring is used to monitor your Pacemaker of ICD from home. This monitoring reduces the number of office visits required to check your device to one time per year. It allows [place] to keep an eye on the functioning of your device to ensure it is working properly. You are scheduled for a device check from home on 01/20/12. You may send your transmission at any time that day. If you have a wireless device, the transmission will be sent automatically. After your physician reviews your transmission, you will receive a postcard with your next transmission date.  Your physician wants you to follow-up in: 1 year with Dr. Graciela Husbands. You will receive a reminder letter in the mail two months in advance. If you don't receive a letter, please call our office to schedule the follow-up appointment.  Your physician recommends that you keep your follow-up appointment: Monday 10/25/11 at 4 pm with Dr. Patty Sermons.  Your physician has recommended you make the following change in your medication:  1) Decrease aspirin to 81 mg once daily. 2) Increase lasix (furosemide) to 40 mg two tablets by mouth twice daily. 3) Stop iron (ferrous sulfate).  Your physician recommends that you have lab work today: bmp

## 2011-10-14 NOTE — Assessment & Plan Note (Signed)
The patient's device was interrogated.  The information was reviewed. No changes were made in the programming.    

## 2011-10-14 NOTE — Assessment & Plan Note (Signed)
No intercurrent atrial fibrillation. I would be inclined to have her stop her aspirin for at least to have it reduced at 81 mg a day. I will defer this to Dr. Patty Sermons

## 2011-10-20 ENCOUNTER — Telehealth: Payer: Self-pay | Admitting: Internal Medicine

## 2011-10-20 NOTE — Telephone Encounter (Signed)
Pt's daughter calling wanting to know lab results --advised kidney function abnormal and dr Graciela Husbands wanted to recheck at next o.v.-daughter agrees

## 2011-10-20 NOTE — Telephone Encounter (Signed)
New problem:  Patient daughter calling regarding her mother's test results.

## 2011-10-25 ENCOUNTER — Encounter: Payer: Self-pay | Admitting: Cardiology

## 2011-10-25 ENCOUNTER — Ambulatory Visit (INDEPENDENT_AMBULATORY_CARE_PROVIDER_SITE_OTHER): Payer: Medicare Other | Admitting: Cardiology

## 2011-10-25 ENCOUNTER — Ambulatory Visit: Payer: Medicare Other | Admitting: Cardiology

## 2011-10-25 VITALS — BP 118/80 | HR 80 | Ht 66.0 in | Wt 166.0 lb

## 2011-10-25 DIAGNOSIS — N189 Chronic kidney disease, unspecified: Secondary | ICD-10-CM

## 2011-10-25 DIAGNOSIS — R002 Palpitations: Secondary | ICD-10-CM

## 2011-10-25 DIAGNOSIS — I4891 Unspecified atrial fibrillation: Secondary | ICD-10-CM

## 2011-10-25 NOTE — Assessment & Plan Note (Signed)
The patient has not been experiencing any areas of palpitations.  Has had some occasional chest pain.  Last evening she awoke with some tightness in her chest which lasted just a short time, radiated through to her back, and then she fell back asleep and when she woke up the pain was going

## 2011-10-25 NOTE — Assessment & Plan Note (Signed)
The patient has a past history of atrial fibrillation.  She is not on warfarin because she is a fall risk.  She is on aspirin 325 mg daily for anticoagulation.

## 2011-10-25 NOTE — Patient Instructions (Addendum)
Your physician wants you to follow-up in: 6 months with Dr. Patty Sermons.  You will receive a reminder letter in the mail two months in advance. If you don't receive a letter, please call our office to schedule the follow-up appointment.   Your physician recommends that you return for lab work soon:  BMET.

## 2011-10-25 NOTE — Progress Notes (Signed)
[name] [name] Date of Birth:  03-11-1925 [facility] [address] [place], [place]  [address] [phone]         Fax   [phone]  History of Present Illness: This pleasant 78 year old woman is seen for a scheduled 6 month followup office visit.  He has a history of a nonischemic dilated cardiomyopathy.  She has a defibrillator in place.  She has a history of compensated chronic systolic congestive heart failure.  She has a history of renal insufficiency and is followed by Dr. Allena Katz of nephrology.  She recently saw Dr. Graciela Husbands on 10/14/11 and had complained of increasing exertional dyspnea.  Dr. Graciela Husbands doubled her furosemide up to 80 mg twice a day.  The patient is not aware of any real change in her dyspnea although her weight is down 4 pounds.  She is not having any peripheral edema today.  We were unable to check a basal metabolic panel today and so we will have her return soon for that.  In the past she has not tolerated high-dose diuretics because of worsening renal function.  Current Outpatient Prescriptions  Medication Sig Dispense Refill  . acetaminophen (TYLENOL) 500 MG tablet Take 500 mg by mouth daily as needed.        [name] allopurinol (ZYLOPRIM) 100 MG tablet Take 100 mg by mouth daily.        . Ascorbic Acid (VITAMIN C) 500 MG tablet Take 500 mg by mouth daily.        [name] aspirin EC 81 MG tablet Take 1 tablet (81 mg total) by mouth daily.      . calcium carbonate (TUMS - DOSED IN MG ELEMENTAL CALCIUM) 500 MG chewable tablet Chew 1 tablet by mouth 3 (three) times daily.        . calcium-vitamin D (OSCAL-500) 500-400 MG-UNIT per tablet Take 1 tablet by mouth 2 (two) times daily.        . carvedilol (COREG) 12.5 MG tablet Take 12.5 mg by mouth 2 (two) times daily with a meal.        . colchicine 0.6 MG tablet Take 0.6 mg by mouth daily.       . diphenhydramine-acetaminophen (TYLENOL PM) 25-500 MG TABS Take 2 tablets by mouth at bedtime as needed.        .  ergocalciferol (VITAMIN D2) 50000 UNITS capsule Take 50,000 Units by mouth once a week.        . fenofibrate (TRICOR) 48 MG tablet Take 48 mg by mouth daily.        . fentaNYL (DURAGESIC - DOSED MCG/HR) 25 MCG/HR Place 1 patch onto the skin every 3 (three) days.        . furosemide (LASIX) 40 MG tablet Take two tablets by mouth twice daily.  120 tablet  3  . HYDROcodone-acetaminophen (VICODIN) 5-500 MG per tablet Take 1 tablet by mouth every 6 (six) hours as needed.       [name] levothyroxine (SYNTHROID, LEVOTHROID) 88 MCG tablet Take 88 mcg by mouth daily.        . OxyCODONE HCl, Abuse Deter, 5 MG TABS Take 1 tablet by mouth as needed.        . pantoprazole (PROTONIX) 40 MG tablet Take 1 tablet (40 mg total) by mouth 2 (two) times daily before a meal.  60 tablet  2  . polyvinyl alcohol (ARTIFICIAL TEARS) 1.4 % ophthalmic solution 1 drop as needed.      . potassium chloride  SA (K-DUR,KLOR-CON) 20 MEQ tablet Take 20 mEq by mouth 2 (two) times daily.       . pravastatin (PRAVACHOL) 40 MG tablet Take 40 mg by mouth daily.        . sertraline (ZOLOFT) 100 MG tablet Take 100 mg by mouth daily.        [name] tiotropium (SPIRIVA) 18 MCG inhalation capsule Place 18 mcg into inhaler and inhale daily.          No Known Allergies  Patient Active Problem List  Diagnosis  . HYPOKALEMIA  . OBSTRUCTIVE SLEEP APNEA  . RESTLESS LEG SYNDROME  . ATRIAL FIBRILLATION  . Chronic diastolic heart failure  . RHINITIS  . BRONCHITIS, CHRONIC  . PALPITATIONS  . CHEST PAIN  . left ventricular lead-diaphragmatic stimulation-inactivated  . ICD (implantable cardiac defibrillator), biventricular, LV lead off  . Dysphagia  . Renal failure    History  Smoking status  . Former Smoker -- 1.0 packs/day  . Types: Cigarettes  . Quit date: 04/10/2003  Smokeless tobacco  . Never Used    History  Alcohol Use No    Family History  Problem Relation Age of Onset  . Heart disease Mother   . Colon cancer Mother   . Colon  polyps Mother   . Emphysema Father   . Liver disease Brother   . Kidney disease Mother   . Kidney disease Maternal Grandmother     Review of Systems: Constitutional: no fever chills diaphoresis or fatigue or change in weight.  Head and neck: no hearing loss, no epistaxis, no photophobia or visual disturbance. Respiratory: No cough, shortness of breath or wheezing. Cardiovascular: No chest pain peripheral edema, palpitations. Gastrointestinal: No abdominal distention, no abdominal pain, no change in bowel habits hematochezia or melena. Genitourinary: No dysuria, no frequency, no urgency, no nocturia. Musculoskeletal:No arthralgias, no back pain, no gait disturbance or myalgias. Neurological: No dizziness, no headaches, no numbness, no seizures, no syncope, no weakness, no tremors. Hematologic: No lymphadenopathy, no easy bruising. Psychiatric: No confusion, no hallucinations, no sleep disturbance.    Physical Exam: Filed Vitals:   10/25/11 1639  BP: 118/80  Pulse: 80   the general appearance reveals an elderly woman in no distress.  She walks with a rolling walker.Pupils equal and reactive.   Extraocular Movements are full.  There is no scleral icterus.  The mouth and pharynx are normal.  The neck is supple.  The carotids reveal no bruits.  The jugular venous pressure is normal.  The thyroid is not enlarged.  There is no lymphadenopathy.  Chest reveals a few fine basilar rales.  The heart reveals no murmur gallop rub or click.  No abnormal lift or heave.  The abdomen is soft and nontender.  Bowel sounds are present.  Extremities show no significant edema.Strength is normal and symmetrical in all extremities.  There is no lateralizing weakness.  There are no sensory deficits.  The skin is warm and dry.  There is no rash.   Assessment / Plan: Continue on same medication.  The patient now lives at [facility] in [place].  The daughter is going to look into getting a primary care  provider in [place].  She still wants to come back to [facility] to see her specialists.  We will continue her present medication.  We will have her return soon for a basal metabolic panel and then make a decision whether to keep her Lasix where it is or cut back.  Recheck for a  scheduled followup office visit in 6 months

## 2011-10-27 ENCOUNTER — Other Ambulatory Visit: Payer: Medicare Other

## 2011-11-01 ENCOUNTER — Other Ambulatory Visit: Payer: Medicare Other

## 2011-11-04 ENCOUNTER — Other Ambulatory Visit (INDEPENDENT_AMBULATORY_CARE_PROVIDER_SITE_OTHER): Payer: Medicare Other

## 2011-11-04 DIAGNOSIS — N189 Chronic kidney disease, unspecified: Secondary | ICD-10-CM

## 2011-11-05 LAB — BASIC METABOLIC PANEL
GFR: 21 mL/min — ABNORMAL LOW (ref >60.00)
Glucose, Bld: 105 mg/dL — ABNORMAL HIGH (ref 70–99)
Potassium: 3.9 mEq/L (ref 3.5–5.1)
Sodium: 133 mEq/L — ABNORMAL LOW (ref 135–145)

## 2011-11-08 ENCOUNTER — Telehealth: Payer: Self-pay | Admitting: *Deleted

## 2011-11-08 NOTE — Telephone Encounter (Signed)
Advised daughter of labs and medication change

## 2011-11-08 NOTE — Telephone Encounter (Signed)
Message copied by Burnell Blanks on Mon Nov 08, 2011 11:44 AM ------      Message from: [name]      Created: Sun Nov 07, 2011  7:51 PM       Please report: the renal function has worsened on the higher dose of Lasix. Creat 2.3. Reduce lasix to 80 mg in am and 40 mg in pm.

## 2012-01-20 ENCOUNTER — Encounter: Payer: Medicare Other | Admitting: *Deleted

## 2012-01-25 ENCOUNTER — Encounter: Payer: Self-pay | Admitting: *Deleted

## 2012-05-04 ENCOUNTER — Encounter: Payer: Self-pay | Admitting: *Deleted

## 2012-05-18 ENCOUNTER — Encounter: Payer: Self-pay | Admitting: *Deleted

## 2012-06-08 ENCOUNTER — Ambulatory Visit (INDEPENDENT_AMBULATORY_CARE_PROVIDER_SITE_OTHER): Payer: Medicare Other | Admitting: *Deleted

## 2012-06-08 ENCOUNTER — Encounter: Payer: Self-pay | Admitting: Internal Medicine

## 2012-06-08 DIAGNOSIS — I428 Other cardiomyopathies: Secondary | ICD-10-CM

## 2012-06-08 DIAGNOSIS — Z95810 Presence of automatic (implantable) cardiac defibrillator: Secondary | ICD-10-CM

## 2012-06-08 DIAGNOSIS — I5032 Chronic diastolic (congestive) heart failure: Secondary | ICD-10-CM

## 2012-06-09 LAB — REMOTE ICD DEVICE
AL AMPLITUDE: 2.8 mv
AL IMPEDENCE ICD: 510 Ohm
ATRIAL PACING ICD: 1 pct
DEVICE MODEL ICD: 752544
HV IMPEDENCE: 44 Ohm
RV LEAD IMPEDENCE ICD: 460 Ohm
TZAT-0013FASTVT: 3
TZAT-0018FASTVT: NEGATIVE
TZAT-0020FASTVT: 1 ms
TZON-0003SLOWVT: 425 ms
TZON-0005FASTVT: 6
TZON-0005SLOWVT: 6
TZON-0010FASTVT: 80 ms
TZON-0010SLOWVT: 80 ms
TZST-0001FASTVT: 2
TZST-0003FASTVT: 30 J
TZST-0003FASTVT: 40 J

## 2012-07-04 ENCOUNTER — Encounter: Payer: Self-pay | Admitting: *Deleted

## 2012-09-04 ENCOUNTER — Ambulatory Visit (INDEPENDENT_AMBULATORY_CARE_PROVIDER_SITE_OTHER): Payer: Medicare Other | Admitting: *Deleted

## 2012-09-04 ENCOUNTER — Other Ambulatory Visit: Payer: Self-pay

## 2012-09-04 ENCOUNTER — Encounter: Payer: Self-pay | Admitting: Internal Medicine

## 2012-09-04 DIAGNOSIS — Z95810 Presence of automatic (implantable) cardiac defibrillator: Secondary | ICD-10-CM

## 2012-09-04 DIAGNOSIS — I5032 Chronic diastolic (congestive) heart failure: Secondary | ICD-10-CM

## 2012-09-05 LAB — REMOTE ICD DEVICE
AL IMPEDENCE ICD: 560 Ohm
RV LEAD IMPEDENCE ICD: 430 Ohm
TZAT-0013FASTVT: 3
TZAT-0018FASTVT: NEGATIVE
TZAT-0019FASTVT: 7.5 V
TZAT-0020FASTVT: 1 ms
TZON-0003FASTVT: 375 ms
TZON-0003SLOWVT: 425 ms
TZON-0004FASTVT: 24
TZON-0005FASTVT: 6
TZST-0001FASTVT: 2
TZST-0003FASTVT: 30 J
TZST-0003FASTVT: 40 J
TZST-0003FASTVT: 40 J

## 2012-09-22 ENCOUNTER — Encounter: Payer: Self-pay | Admitting: *Deleted

## 2012-10-17 ENCOUNTER — Encounter: Payer: Medicare Other | Admitting: Internal Medicine

## 2012-10-19 ENCOUNTER — Encounter: Payer: Self-pay | Admitting: Internal Medicine

## 2012-10-19 ENCOUNTER — Ambulatory Visit (INDEPENDENT_AMBULATORY_CARE_PROVIDER_SITE_OTHER): Payer: Medicare Other | Admitting: Internal Medicine

## 2012-10-19 VITALS — BP 128/64 | HR 74 | Ht 61.0 in | Wt 169.0 lb

## 2012-10-19 DIAGNOSIS — Z95810 Presence of automatic (implantable) cardiac defibrillator: Secondary | ICD-10-CM

## 2012-10-19 DIAGNOSIS — I5032 Chronic diastolic (congestive) heart failure: Secondary | ICD-10-CM

## 2012-10-19 NOTE — Patient Instructions (Signed)
Your physician recommends that you continue on your current medications as directed. Please refer to the Current Medication list given to you today.  Remote monitoring is used to monitor your Pacemaker of ICD from home. This monitoring reduces the number of office visits required to check your device to one time per year. It allows [place] to keep an eye on the functioning of your device to ensure it is working properly. You are scheduled for a device check from home on 01/22/13. You may send your transmission at any time that day. If you have a wireless device, the transmission will be sent automatically. After your physician reviews your transmission, you will receive a postcard with your next transmission date.  Your physician wants you to follow-up in: 1 year with Dr. Graciela Husbands. You will receive a reminder letter in the mail two months in advance. If you don't receive a letter, please call our office to schedule the follow-up appointment.

## 2012-10-19 NOTE — Progress Notes (Signed)
[name] [name] Patient Care Team: Gaspar Garbe, MD as PCP - General   HPI  [name] [name] is a 79 y.o. female seen in followup for nonischemic cardiomyopathy with congestive heart failure for which she underwent CRT-D implantation in 2005 with device generator replacement in Dec 2010;  Following generator replacement she has noted diaphragmatic stimulation prompting inactivation of LV pacing. This had not been noticed with the prior device.   Assessment of left ventricular function by echo August 2011 had demonstrated late normalization of left ventricular function .she has not been seen by our office in almost a year. Prior evaluation with renal has presumably been ongoing but we have no records.  She has recently been hospitalized in [place] where she now lives with changes in her medications. She has seen Dr. Allena Katz in the last number of months but not recently. She has significant worsening of fatigue and exercise tolerance.     Past Medical History  Diagnosis Date  . Atrial fibrillation   . BRONCHITIS, CHRONIC   . CHEST PAIN   . HYPOKALEMIA   . OBSTRUCTIVE SLEEP APNEA   . Palpitations   . SYSTOLIC HEART FAILURE, CHRONIC   . Depression   . CPAP (continuous positive airway pressure) dependence   . ACE-inhibitor cough   . Cardiac defibrillator in situ   . Chronic renal insufficiency   . GERD (gastroesophageal reflux disease)   . Hypothyroidism   . Hematuria   . Osteoarthritis (arthritis due to wear and tear of joints)   . Colon polyps   . Chronic rhinitis   . RLS (restless legs syndrome)   . Chronic back pain   . Arthritis   . Glaucoma   . Sleep apnea     Past Surgical History  Procedure Laterality Date  . Back surgery    . Cardiac defibrillator placement  2005     ST. JUDE Model [phone]  . Skin cancer excision    . Cardiac catheterization  07/29/2003     Congestive heart failure, questionable etiology.  Increased LVEDP of on cath --   Nonobstructive coronary disease --         . Insert / replace / remove pacemaker  01/10/2004    Aborted biventricular implantable cardiac defibrillator implantation, complicated by probable pneumothorax  --  Duke Salvia, M.D.  . Atrial cardiac pacemaker insertion  01/15/2004    St. Jude 1581 65 cm dual coil active fixation defibrillator lead, serial number [id number] --  St. Jude Atlas +HF model V340CRT defibrillator, serial [id number]. -- Biventricular implantable cardioverter-defibrillator implantation, contrast venography and intraoperative defibrillation threshold testing -- SURGEON:  Duke Salvia, M.D.  . Cardiac pacemaker placement  03/17/2009    generator change  --  Unify St. Jude ICD model [id number], serial number [id number]  -- Duke Salvia, MD, [facility]   . Femur fracture surgery    . Esophagogastroduodenoscopy  04/13/2011    Procedure: ESOPHAGOGASTRODUODENOSCOPY (EGD);  Surgeon: Erick Blinks, MD;  Location: [facility];  Service: Gastroenterology;  Laterality: N/A;  may need dilation    Current Outpatient Prescriptions  Medication Sig Dispense Refill  . acetaminophen (TYLENOL) 500 MG tablet Take 500 mg by mouth daily as needed.        [name] Kitchen allopurinol (ZYLOPRIM) 100 MG tablet Take 100 mg by mouth daily.        . Ascorbic Acid (VITAMIN C) 500 MG tablet Take 500 mg by mouth daily.        [name] Kitchen aspirin EC  81 MG tablet Take 1 tablet (81 mg total) by mouth daily.      . calcium carbonate (TUMS - DOSED IN MG ELEMENTAL CALCIUM) 500 MG chewable tablet Chew 1 tablet by mouth 3 (three) times daily.        . calcium-vitamin D (OSCAL-500) 500-400 MG-UNIT per tablet Take 1 tablet by mouth 2 (two) times daily.        . carvedilol (COREG) 12.5 MG tablet Take 12.5 mg by mouth 2 (two) times daily with a meal.        . colchicine 0.6 MG tablet Take 0.6 mg by mouth daily.       . diphenhydramine-acetaminophen (TYLENOL PM) 25-500 MG TABS Take 2 tablets by mouth at bedtime as needed.        . ergocalciferol (VITAMIN D2)  50000 UNITS capsule Take 50,000 Units by mouth once a week.        . fentaNYL (DURAGESIC - DOSED MCG/HR) 25 MCG/HR Place 1 patch onto the skin every 3 (three) days.        . furosemide (LASIX) 40 MG tablet Take 40 mg by mouth as directed. Take two tablets by mouth in the morning and one tablet in the evening      . hydrALAZINE (APRESOLINE) 10 MG tablet Take 10 mg by mouth 3 (three) times daily. 3 times daily, breakfast, 2:00 pm and bedtime      . HYDROcodone-acetaminophen (VICODIN) 5-500 MG per tablet Take 1 tablet by mouth every 6 (six) hours as needed.       . isosorbide mononitrate (IMDUR) 30 MG 24 hr tablet Take 30 mg by mouth daily.      [name] Kitchen levothyroxine (SYNTHROID, LEVOTHROID) 88 MCG tablet Take 88 mcg by mouth daily.        . OxyCODONE HCl, Abuse Deter, 5 MG TABS Take 1 tablet by mouth as needed.        . pantoprazole (PROTONIX) 40 MG tablet Take 1 tablet (40 mg total) by mouth 2 (two) times daily before a meal.  60 tablet  2  . polyvinyl alcohol (ARTIFICIAL TEARS) 1.4 % ophthalmic solution 1 drop as needed.      . potassium chloride SA (K-DUR,KLOR-CON) 20 MEQ tablet Take 20 mEq by mouth 2 (two) times daily.       . sertraline (ZOLOFT) 100 MG tablet Take 100 mg by mouth daily.        [name] Kitchen tiotropium (SPIRIVA) 18 MCG inhalation capsule Place 18 mcg into inhaler and inhale daily.         No current facility-administered medications for this visit.    No Known Allergies  Review of Systems negative except from HPI and PMH  Physical Exam BP 128/64  Pulse 74  Ht 5\' 1"  (1.549 m)  Wt 169 lb (76.658 kg)  BMI 31.95 kg/m2 Well developed and well nourished in no acute distress HENT normal E scleral and icterus clear Nec<10  carotids brisk and full Clear to ausculation  1Regular rate and rhythm, no murmurs gallops or rub Soft with active bowel sounds No clubbing cyanosis 2+ Edema Alert and oriented, grossly normal motor and sensory function Skin Warm and Dry    Assessment and  Plan

## 2012-10-19 NOTE — Assessment & Plan Note (Addendum)
The device was interrogated and is functioning normally. She is a sense V. Sense almost always. There are no intercurrent events either in the atrium or the ventricle.  We had a lengthy discussion with half of our 55 minute visit related to end-of-life issues and the role of ICD in activation living wills and DO NOT RESUSCITATE status. Her daughter was portable his conversations. We're glad to help in any further we can.

## 2012-10-19 NOTE — Assessment & Plan Note (Signed)
The patient has chronic heart failure and is mildly volume overloaded. However, given the renal issues and  usnot having access to these data I am loathe To make adjustments.    i have encouraged them ot follow closely with renal and TB

## 2012-10-20 LAB — ICD DEVICE OBSERVATION
AL AMPLITUDE: 3.6 mv
BAMS-0001: 150 {beats}/min
RV LEAD AMPLITUDE: 12 mv
TZAT-0004FASTVT: 8
TZAT-0013FASTVT: 3
TZAT-0018FASTVT: NEGATIVE
TZON-0003FASTVT: 375 ms
TZON-0003SLOWVT: 425 ms
TZON-0010SLOWVT: 80 ms
TZST-0001FASTVT: 3
TZST-0003FASTVT: 30 J
TZST-0003FASTVT: 40 J

## 2012-10-24 ENCOUNTER — Ambulatory Visit (INDEPENDENT_AMBULATORY_CARE_PROVIDER_SITE_OTHER): Payer: Medicare Other | Admitting: Cardiology

## 2012-10-24 ENCOUNTER — Encounter: Payer: Self-pay | Admitting: Cardiology

## 2012-10-24 VITALS — BP 120/68 | HR 80 | Ht 66.0 in | Wt 165.1 lb

## 2012-10-24 DIAGNOSIS — I5022 Chronic systolic (congestive) heart failure: Secondary | ICD-10-CM

## 2012-10-24 DIAGNOSIS — N19 Unspecified kidney failure: Secondary | ICD-10-CM

## 2012-10-24 DIAGNOSIS — I509 Heart failure, unspecified: Secondary | ICD-10-CM

## 2012-10-24 DIAGNOSIS — R079 Chest pain, unspecified: Secondary | ICD-10-CM

## 2012-10-24 NOTE — Assessment & Plan Note (Signed)
The patient has a history of chronic renal insufficiency.  Her last available blood work is from 10/16/12 from [facility] and shows a BUN of 37, creatinine 1.70, and estimated GFR of 31.  She has an appointment sent to see her nephrologist Dr. Allena Katz.

## 2012-10-24 NOTE — Assessment & Plan Note (Signed)
The patient is clinically improved with improved exertional dyspnea on her current regimen of Lasix 80 mg twice a day and with the recent addition of hydralazine and isosorbide mononitrate.  Her weight is down 4 pounds since last visit and her edema has improved.

## 2012-10-24 NOTE — Progress Notes (Signed)
[name] [name] Date of Birth:  03/06/1925 [facility] [address] [place], [place]  [address] [phone]         Fax   [phone]  History of Present Illness: This pleasant 79 year old woman is seen for a scheduled followup office visit. He has a history of a nonischemic dilated cardiomyopathy. She has a defibrillator in place. She has a history of compensated chronic systolic congestive heart failure. She has a history of renal insufficiency and is followed by Dr. Allena Katz of nephrology.  She was recently evaluated for her ICD by Dr. Graciela Husbands and everything was found to be working well.  The patient was recently [facility] at the [facility] [place] because of increased fluid retention.  In the hospital her pravastatin and her fenofibrate was discontinued.  Her Lasix was increased to 80 mg twice a day and she was also placed on hydralazine and isosorbide.  Her last echocardiogram in fracture is from 2005 and at that time showed severe left ventricular dysfunction with global hypokinesis and an ejection fraction in the range of 20%.   Current Outpatient Prescriptions  Medication Sig Dispense Refill  . acetaminophen (TYLENOL) 500 MG tablet Take 500 mg by mouth daily as needed.        [name] allopurinol (ZYLOPRIM) 100 MG tablet Take 100 mg by mouth daily.        . Ascorbic Acid (VITAMIN C) 500 MG tablet Take 500 mg by mouth daily.        [name] aspirin EC 81 MG tablet Take 1 tablet (81 mg total) by mouth daily.      . calcium carbonate (TUMS - DOSED IN MG ELEMENTAL CALCIUM) 500 MG chewable tablet Chew 1 tablet by mouth 3 (three) times daily.        . calcium-vitamin D (OSCAL-500) 500-400 MG-UNIT per tablet Take 1 tablet by mouth 2 (two) times daily.        . carvedilol (COREG) 12.5 MG tablet Take 12.5 mg by mouth 2 (two) times daily with a meal.        . colchicine 0.6 MG tablet Take 0.6 mg by mouth daily.       . diphenhydramine-acetaminophen (TYLENOL PM) 25-500  MG TABS Take 2 tablets by mouth at bedtime as needed.        . ergocalciferol (VITAMIN D2) 50000 UNITS capsule Take 50,000 Units by mouth once a week.        . fentaNYL (DURAGESIC - DOSED MCG/HR) 25 MCG/HR Place 1 patch onto the skin every 3 (three) days.        . furosemide (LASIX) 40 MG tablet Take 40 mg by mouth as directed. Take two tablets by mouth in the morning and two tablets at 2 p.m      . hydrALAZINE (APRESOLINE) 10 MG tablet Take 10 mg by mouth 3 (three) times daily. 3 times daily, breakfast, 2:00 pm and bedtime      . HYDROcodone-acetaminophen (VICODIN) 5-500 MG per tablet Take 1 tablet by mouth every 6 (six) hours as needed.       . isosorbide mononitrate (IMDUR) 30 MG 24 hr tablet Take 30 mg by mouth daily.      [name] levothyroxine (SYNTHROID, LEVOTHROID) 88 MCG tablet Take 88 mcg by mouth daily.        . OxyCODONE HCl, Abuse Deter, 5 MG TABS Take 1 tablet by mouth as needed.        . pantoprazole (PROTONIX) 40 MG tablet Take  1 tablet (40 mg total) by mouth 2 (two) times daily before a meal.  60 tablet  2  . polyvinyl alcohol (ARTIFICIAL TEARS) 1.4 % ophthalmic solution 1 drop as needed.      . potassium chloride SA (K-DUR,KLOR-CON) 20 MEQ tablet Take 20 mEq by mouth 2 (two) times daily.       . sertraline (ZOLOFT) 100 MG tablet Take 100 mg by mouth daily.        [name] tiotropium (SPIRIVA) 18 MCG inhalation capsule Place 18 mcg into inhaler and inhale daily.         No current facility-administered medications for this visit.    No Known Allergies  Patient Active Problem List   Diagnosis Date Noted  . Renal failure 10/14/2011  . Dysphagia 03/04/2011  . Biventricular implantable cardioverter-defibrillator LV lead off 09/15/2010  . CHEST PAIN 07/01/2009  . left ventricular lead-diaphragmatic stimulation-inactivated 06/03/2009  . PALPITATIONS 04/18/2009  . HYPOKALEMIA 03/20/2009  . ATRIAL FIBRILLATION 03/14/2009  . RHINITIS 08/01/2007  . BRONCHITIS, CHRONIC 08/01/2007  .  OBSTRUCTIVE SLEEP APNEA 04/20/2007  . RESTLESS LEG SYNDROME 04/20/2007  . Chronic diastolic heart failure 04/20/2007    History  Smoking status  . Former Smoker -- 1.00 packs/day  . Types: Cigarettes  . Quit date: 04/10/2003  Smokeless tobacco  . Never Used    History  Alcohol Use No    Family History  Problem Relation Age of Onset  . Heart disease Mother   . Colon cancer Mother   . Colon polyps Mother   . Emphysema Father   . Liver disease Brother   . Kidney disease Mother   . Kidney disease Maternal Grandmother     Review of Systems: Constitutional: no fever chills diaphoresis or fatigue or change in weight.  Head and neck: no hearing loss, no epistaxis, no photophobia or visual disturbance. Respiratory: No cough, shortness of breath or wheezing. Cardiovascular: No chest pain peripheral edema, palpitations. Gastrointestinal: No abdominal distention, no abdominal pain, no change in bowel habits hematochezia or melena. Genitourinary: No dysuria, no frequency, no urgency, no nocturia. Musculoskeletal:No arthralgias, no back pain, no gait disturbance or myalgias. Neurological: No dizziness, no headaches, no numbness, no seizures, no syncope, no weakness, no tremors. Hematologic: No lymphadenopathy, no easy bruising. Psychiatric: No confusion, no hallucinations, no sleep disturbance.    Physical Exam: Filed Vitals:   10/24/12 0914  BP: 120/68  Pulse: 80   the general appearance reveals a pleasant elderly woman in no acute distress.The head and neck exam reveals pupils equal and reactive.  Extraocular movements are full.  There is no scleral icterus.  The mouth and pharynx are normal.  The neck is supple.  The carotids reveal no bruits.  The jugular venous pressure is normal.  The  thyroid is not enlarged.  There is no lymphadenopathy.  The chest is clear to percussion and auscultation.  There are no rales or rhonchi.  Expansion of the chest is symmetrical.  The precordium  is quiet.  The rhythm is regular.  The first heart sound is normal.  The second heart sound is physiologically split.  There is no murmur gallop rub or click.  There is no abnormal lift or heave.  The abdomen is soft and nontender.  The bowel sounds are normal.  The liver and spleen are not enlarged.  There are no abdominal masses.  There are no abdominal bruits.  Extremities reveal pedal pulses are present. There is no phlebitis and there is only  trace pretibial edema.  There is no cyanosis or clubbing.  Strength is normal and symmetrical in all extremities.  There is no lateralizing weakness.  There are no sensory deficits.  The skin is warm and dry.  There is no rash.     Assessment / Plan: The patient is doing well on current therapy.  We will have her return to update her two-dimensional echocardiogram and evaluate her current ejection fraction.  She is now living at [facility] [place].  Her daughter prepares her pillboxes each week and they pay [facility] to dispense the breakfast and lunch medicines and the daughter dispenses the supper medicines.  They are overall pleased with this arrangement. Continue same medication and be rechecked in 4 months for office visit and EKG

## 2012-10-24 NOTE — Patient Instructions (Addendum)
Your physician has requested that you have an echocardiogram. Echocardiography is a painless test that uses sound waves to create images of your heart. It provides your doctor with information about the size and shape of your heart and how well your heart's chambers and valves are working. This procedure takes approximately one hour. There are no restrictions for this procedure.  Your physician wants you to follow-up in: 4 months with ekg,You will receive a reminder letter in the mail two months in advance. If you don't receive a letter, please call our office to schedule the follow-up appointment.   Your physician recommends that you continue on your current medications as directed. Please refer to the Current Medication list given to you today.

## 2012-10-24 NOTE — Assessment & Plan Note (Signed)
The patient has not been experiencing any chest pain or angina. 

## 2012-11-02 ENCOUNTER — Ambulatory Visit (HOSPITAL_COMMUNITY): Payer: Medicare Other | Attending: Cardiology

## 2012-11-02 DIAGNOSIS — I428 Other cardiomyopathies: Secondary | ICD-10-CM | POA: Insufficient documentation

## 2012-11-02 DIAGNOSIS — I5022 Chronic systolic (congestive) heart failure: Secondary | ICD-10-CM | POA: Insufficient documentation

## 2012-11-02 DIAGNOSIS — I509 Heart failure, unspecified: Secondary | ICD-10-CM

## 2012-11-02 DIAGNOSIS — R002 Palpitations: Secondary | ICD-10-CM | POA: Insufficient documentation

## 2012-11-02 DIAGNOSIS — G4730 Sleep apnea, unspecified: Secondary | ICD-10-CM | POA: Insufficient documentation

## 2012-11-02 DIAGNOSIS — N189 Chronic kidney disease, unspecified: Secondary | ICD-10-CM | POA: Insufficient documentation

## 2012-11-02 NOTE — Progress Notes (Signed)
Echocardiogram performed.  

## 2012-11-07 ENCOUNTER — Telehealth: Payer: Self-pay | Admitting: *Deleted

## 2012-11-07 NOTE — Telephone Encounter (Signed)
Message copied by Burnell Blanks on Tue Nov 07, 2012  9:03 AM ------      Message from: [name]      Created: Sun Nov 05, 2012  6:19 PM       Please report.  The LV function has improved since 2005 when EF was 10-20%.  Now EF is 20-25%      Continue same meds. ------

## 2012-11-07 NOTE — Telephone Encounter (Signed)
Advised daughter 

## 2013-01-22 ENCOUNTER — Ambulatory Visit (INDEPENDENT_AMBULATORY_CARE_PROVIDER_SITE_OTHER): Payer: Medicare Other | Admitting: *Deleted

## 2013-01-22 DIAGNOSIS — Z95810 Presence of automatic (implantable) cardiac defibrillator: Secondary | ICD-10-CM

## 2013-01-22 DIAGNOSIS — I509 Heart failure, unspecified: Secondary | ICD-10-CM

## 2013-01-22 DIAGNOSIS — I5022 Chronic systolic (congestive) heart failure: Secondary | ICD-10-CM

## 2013-01-23 ENCOUNTER — Encounter: Payer: Self-pay | Admitting: Internal Medicine

## 2013-01-23 LAB — REMOTE ICD DEVICE
AL AMPLITUDE: 1.2 mv
AL IMPEDENCE ICD: 590 Ohm
ATRIAL PACING ICD: 1 pct
DEVICE MODEL ICD: 752544
HV IMPEDENCE: 45 Ohm
RV LEAD IMPEDENCE ICD: 530 Ohm
TZAT-0018FASTVT: NEGATIVE
TZAT-0020FASTVT: 1 ms
TZON-0003SLOWVT: 425 ms
TZON-0005FASTVT: 6
TZON-0005SLOWVT: 6
TZON-0010FASTVT: 80 ms
TZON-0010SLOWVT: 80 ms
TZST-0001FASTVT: 2
TZST-0003FASTVT: 30 J
TZST-0003FASTVT: 40 J

## 2013-02-12 ENCOUNTER — Encounter: Payer: Self-pay | Admitting: *Deleted

## 2013-03-26 ENCOUNTER — Telehealth: Payer: Self-pay | Admitting: Internal Medicine

## 2013-03-26 NOTE — Telephone Encounter (Signed)
New Problem:  Pt's daughter states she is trying to send a transmission and would like some guidance. She also states she would like to know if a transmission has gone through. Please advise

## 2013-03-26 NOTE — Telephone Encounter (Signed)
New message  Patient's mother fell last week. She was taken to a urgent care in [place]. She was complaining of pain were defibrillator is, She is complaining that it hurts and daughter is very concerned. Please advise.[name]

## 2013-03-26 NOTE — Telephone Encounter (Signed)
Spoke w/[name] [name] in regards to pt's fall. Pt to send a transmission thru [employer]. [name], pt's daughter to call to make sure transmission was received.

## 2013-03-26 NOTE — Telephone Encounter (Signed)
Spoke with pts daughter who states pt fell 11/8 on left side and felt like something pulled.  She hurt her shoulder and neck.  Was seen at the [facility] and had X-ray and CT.  She was told everything looked OK but no one checked her defib.  She was given muscle relaxants and antiinflammatories.  She is still having discomfort and soreness in the area around her defib with left arm movement.  Would like to know how to proceed.  Aware I will forward this for advice and someone will call her back.

## 2013-03-26 NOTE — Telephone Encounter (Signed)
Left message for patient's daughter - instructions for sending remote transmission.

## 2013-03-28 ENCOUNTER — Telehealth: Payer: Self-pay | Admitting: Cardiology

## 2013-03-28 NOTE — Telephone Encounter (Signed)
Transmission was received. LMOM for pt.

## 2013-03-28 NOTE — Telephone Encounter (Signed)
New Problem  Pt daughter requests a call back to determine if the signal was received and its results//states that they were having difficulty sending the message// please call

## 2013-03-29 ENCOUNTER — Ambulatory Visit: Payer: Medicare Other | Admitting: Cardiology

## 2013-04-02 ENCOUNTER — Ambulatory Visit: Payer: Self-pay | Admitting: Cardiology

## 2013-04-16 ENCOUNTER — Encounter: Payer: Self-pay | Admitting: Cardiology

## 2013-04-16 ENCOUNTER — Ambulatory Visit (INDEPENDENT_AMBULATORY_CARE_PROVIDER_SITE_OTHER): Payer: Medicare Other | Admitting: Cardiology

## 2013-04-16 VITALS — BP 155/68 | HR 71 | Wt 165.0 lb

## 2013-04-16 DIAGNOSIS — IMO0002 Reserved for concepts with insufficient information to code with codable children: Secondary | ICD-10-CM

## 2013-04-16 DIAGNOSIS — Z95810 Presence of automatic (implantable) cardiac defibrillator: Secondary | ICD-10-CM

## 2013-04-16 DIAGNOSIS — I5022 Chronic systolic (congestive) heart failure: Secondary | ICD-10-CM

## 2013-04-16 DIAGNOSIS — N189 Chronic kidney disease, unspecified: Secondary | ICD-10-CM

## 2013-04-16 DIAGNOSIS — R627 Adult failure to thrive: Secondary | ICD-10-CM | POA: Insufficient documentation

## 2013-04-16 DIAGNOSIS — I509 Heart failure, unspecified: Secondary | ICD-10-CM

## 2013-04-16 DIAGNOSIS — R079 Chest pain, unspecified: Secondary | ICD-10-CM

## 2013-04-16 DIAGNOSIS — I447 Left bundle-branch block, unspecified: Secondary | ICD-10-CM

## 2013-04-16 NOTE — Patient Instructions (Signed)
Your physician recommends that you continue on your current medications as directed. Please refer to the Current Medication list given to you today.  Your physician wants you to follow-up in: 6 month ov You will receive a reminder letter in the mail two months in advance. If you don't receive a letter, please call our office to schedule the follow-up appointment.  

## 2013-04-16 NOTE — Progress Notes (Signed)
[name] [name] Date of Birth:  [birth year] [address] [place], [place]  [address] [phone]         Fax   [phone]  History of Present Illness: This pleasant 79 year old woman is seen for a scheduled followup office visit. He has a history of a nonischemic dilated cardiomyopathy. She has a defibrillator in place. She has a history of compensated chronic systolic congestive heart failure. She has a history of renal insufficiency and is followed by Dr. Allena Katz of nephrology.  She was  evaluated for her ICD by Dr. Graciela Husbands and everything was found to be working well.  Since we last saw her the patient was hospitalized at the [facility] in [place] after having 2 falls within a period of 2 weeks.  She was admitted because she was complaining of pain in the left upper chest near her defibrillator.  She was found not to have fractured anything in the defibrillator was working well.  She was admitted overnight.  She was found to have a urinary tract infection.  She was too unsteady on her feet to return to her previous living quarters and so she is presently living in a rehabilitation skilled nursing facility called [facility].  She was told that her renal function had worsened..  Her last echocardiogram was 11/02/12 and showed an ejection fraction of 20-25% which is an improvement over the previous ejection fraction of 10-20% noted in 2005.   Current Outpatient Prescriptions  Medication Sig Dispense Refill  . acetaminophen (TYLENOL) 500 MG tablet Take 500 mg by mouth daily as needed.        [name] allopurinol (ZYLOPRIM) 100 MG tablet Take 100 mg by mouth daily.        . Ascorbic Acid (VITAMIN C) 500 MG tablet Take 500 mg by mouth daily.        [name] aspirin EC 81 MG tablet Take 1 tablet (81 mg total) by mouth daily.      . calcium carbonate (TUMS - DOSED IN MG ELEMENTAL CALCIUM) 500 MG chewable tablet Chew 1 tablet by mouth 2 (two) times daily with a meal.       . carvedilol  (COREG) 6.25 MG tablet Take 6.25 mg by mouth 2 (two) times daily with a meal.      . colchicine 0.6 MG tablet 1/2 tab po qd      . diphenhydramine-acetaminophen (TYLENOL PM) 25-500 MG TABS Take 2 tablets by mouth at bedtime as needed.        . ergocalciferol (VITAMIN D2) 50000 UNITS capsule Take 50,000 Units by mouth once a week.        . fentaNYL (DURAGESIC - DOSED MCG/HR) 25 MCG/HR Place 1 patch onto the skin every 3 (three) days.        . furosemide (LASIX) 40 MG tablet Take 40 mg by mouth 2 (two) times daily.       . hydrALAZINE (APRESOLINE) 10 MG tablet Take 10 mg by mouth 3 (three) times daily. 3 times daily, breakfast, 2:00 pm and bedtime      . HYDROcodone-acetaminophen (VICODIN) 5-500 MG per tablet Take 1 tablet by mouth every 6 (six) hours as needed.       [name] levothyroxine (SYNTHROID, LEVOTHROID) 88 MCG tablet Take 88 mcg by mouth daily.        . OxyCODONE HCl, Abuse Deter, 5 MG TABS Take 1 tablet by mouth as needed.        . pantoprazole (PROTONIX)  40 MG tablet Take 1 tablet (40 mg total) by mouth 2 (two) times daily before a meal.  60 tablet  2  . potassium chloride SA (K-DUR,KLOR-CON) 20 MEQ tablet Take 20 mEq by mouth 2 (two) times daily.       . sertraline (ZOLOFT) 100 MG tablet Take 100 mg by mouth daily.        . isosorbide mononitrate (IMDUR) 30 MG 24 hr tablet Take 30 mg by mouth daily.       No current facility-administered medications for this visit.    No Known Allergies  Patient Active Problem List   Diagnosis Date Noted  . Chronic systolic congestive heart failure 10/24/2012  . Renal failure 10/14/2011  . Dysphagia 03/04/2011  . Biventricular implantable cardioverter-defibrillator LV lead off 09/15/2010  . CHEST PAIN 07/01/2009  . left ventricular lead-diaphragmatic stimulation-inactivated 06/03/2009  . PALPITATIONS 04/18/2009  . HYPOKALEMIA 03/20/2009  . ATRIAL FIBRILLATION 03/14/2009  . RHINITIS 08/01/2007  . BRONCHITIS, CHRONIC 08/01/2007  . OBSTRUCTIVE  SLEEP APNEA 04/20/2007  . RESTLESS LEG SYNDROME 04/20/2007    History  Smoking status  . Former Smoker -- 1.00 packs/day  . Types: Cigarettes  . Quit date: 04/10/2003  Smokeless tobacco  . Never Used    History  Alcohol Use No    Family History  Problem Relation Age of Onset  . Heart disease Mother   . Colon cancer Mother   . Colon polyps Mother   . Emphysema Father   . Liver disease Brother   . Kidney disease Mother   . Kidney disease Maternal Grandmother     Review of Systems: Constitutional: no fever chills diaphoresis or fatigue or change in weight.  Head and neck: no hearing loss, no epistaxis, no photophobia or visual disturbance. Respiratory: No cough, shortness of breath or wheezing. Cardiovascular: No chest pain peripheral edema, palpitations. Gastrointestinal: No abdominal distention, no abdominal pain, no change in bowel habits hematochezia or melena. Genitourinary: No dysuria, no frequency, no urgency, no nocturia. Musculoskeletal:No arthralgias, no back pain, no gait disturbance or myalgias. Neurological: No dizziness, no headaches, no numbness, no seizures, no syncope, no weakness, no tremors. Hematologic: No lymphadenopathy, no easy bruising. Psychiatric: No confusion, no hallucinations, no sleep disturbance.    Physical Exam: Filed Vitals:   04/16/13 1454  BP: 155/68  Pulse: 71   the general appearance reveals a pleasant elderly woman in no acute distress.The head and neck exam reveals pupils equal and reactive.  Extraocular movements are full.  There is no scleral icterus.  The mouth and pharynx are normal.  The neck is supple.  The carotids reveal no bruits.  The jugular venous pressure is normal.  The  thyroid is not enlarged.  There is no lymphadenopathy.  The chest is clear to percussion and auscultation.  There are no rales or rhonchi.  Expansion of the chest is symmetrical.  The precordium is quiet.  The rhythm is regular.  The first heart sound  is normal.  The second heart sound is physiologically split.  There is no murmur gallop rub or click.  There is no abnormal lift or heave.  The abdomen is soft and nontender.  The bowel sounds are normal.  The liver and spleen are not enlarged.  There are no abdominal masses.  There are no abdominal bruits.  Extremities reveal pedal pulses are present. There is no phlebitis and there is only trace pretibial edema.  There is no cyanosis or clubbing.  Strength is normal and  symmetrical in all extremities.  There is no lateralizing weakness.  There are no sensory deficits.  The skin is warm and dry.  There is no rash.  EKG shows normal sinus rhythm and interventricular conduction disturbance with normal PR interval.   Assessment / Plan: The patient is to continue same medication.  Recheck in 6 months for office visit and EKG.  She will be giving her electrolytes and renal function checked with her nephrologist soon.

## 2013-04-16 NOTE — Assessment & Plan Note (Signed)
The patient has had recent falls.  She states that her legs just give out from under her.  She has not had syncope.  She is getting rehabilitation now to see if she would become strong enough to return to her apartment.  Otherwise she may need to find an assisted living facility and her daughter is working on that with her

## 2013-04-16 NOTE — Assessment & Plan Note (Signed)
The patient has mild chronic exertional dyspnea.  She is relatively sedentary.  Her poor balance limits her activity at this time and she is getting daily physical therapy at the rehabilitation center.

## 2013-04-16 NOTE — Assessment & Plan Note (Signed)
The patient has not been having chest pain or angina 

## 2013-04-23 ENCOUNTER — Encounter: Payer: Medicare Other | Admitting: *Deleted

## 2013-05-02 ENCOUNTER — Encounter: Payer: Self-pay | Admitting: *Deleted

## 2013-05-25 ENCOUNTER — Ambulatory Visit (INDEPENDENT_AMBULATORY_CARE_PROVIDER_SITE_OTHER): Payer: Medicare HMO | Admitting: *Deleted

## 2013-05-25 ENCOUNTER — Encounter: Payer: Self-pay | Admitting: Internal Medicine

## 2013-05-25 DIAGNOSIS — I509 Heart failure, unspecified: Secondary | ICD-10-CM

## 2013-05-25 DIAGNOSIS — I4891 Unspecified atrial fibrillation: Secondary | ICD-10-CM

## 2013-05-25 DIAGNOSIS — I5022 Chronic systolic (congestive) heart failure: Secondary | ICD-10-CM

## 2013-05-25 LAB — MDC_IDC_ENUM_SESS_TYPE_REMOTE
Battery Remaining Longevity: 64 mo
Battery Voltage: 2.95 V
Brady Statistic AS VP Percent: 1 %
Brady Statistic RA Percent Paced: 1 %
HIGH POWER IMPEDANCE MEASURED VALUE: 43 Ohm
Lead Channel Impedance Value: 650 Ohm
Lead Channel Pacing Threshold Amplitude: 0.625 V
Lead Channel Pacing Threshold Amplitude: 0.75 V
Lead Channel Pacing Threshold Pulse Width: 0.5 ms
Lead Channel Sensing Intrinsic Amplitude: 2.3 mV
Lead Channel Setting Pacing Amplitude: 2 V
Lead Channel Setting Pacing Amplitude: 2 V
Lead Channel Setting Pacing Pulse Width: 0.5 ms
Lead Channel Setting Sensing Sensitivity: 0.5 mV
MDC IDC MSMT BATTERY REMAINING PERCENTAGE: 60 %
MDC IDC MSMT LEADCHNL RV IMPEDANCE VALUE: 460 Ohm
MDC IDC MSMT LEADCHNL RV PACING THRESHOLD PULSEWIDTH: 0.5 ms
MDC IDC MSMT LEADCHNL RV SENSING INTR AMPL: 11.7 mV
MDC IDC PG SERIAL: 752544
MDC IDC SESS DTM: 20141215070017
MDC IDC SET ZONE DETECTION INTERVAL: 300 ms
MDC IDC SET ZONE DETECTION INTERVAL: 375 ms
MDC IDC STAT BRADY AP VP PERCENT: 1 %
MDC IDC STAT BRADY AP VS PERCENT: 1 %
MDC IDC STAT BRADY AS VS PERCENT: 99 %
MDC IDC STAT BRADY RV PERCENT PACED: 1 %
Zone Setting Detection Interval: 425 ms

## 2013-05-30 ENCOUNTER — Encounter: Payer: Self-pay | Admitting: Internal Medicine

## 2013-06-13 ENCOUNTER — Encounter: Payer: Self-pay | Admitting: *Deleted

## 2013-07-17 ENCOUNTER — Emergency Department (HOSPITAL_COMMUNITY): Payer: Medicare HMO

## 2013-07-17 ENCOUNTER — Encounter (HOSPITAL_COMMUNITY): Payer: Self-pay | Admitting: Emergency Medicine

## 2013-07-17 ENCOUNTER — Observation Stay (HOSPITAL_COMMUNITY)
Admission: EM | Admit: 2013-07-17 | Discharge: 2013-07-19 | Disposition: A | Discharge status: Nursing Home (Includes ICF) | Payer: Medicare HMO | Attending: Internal Medicine | Admitting: Internal Medicine

## 2013-07-17 DIAGNOSIS — I4891 Unspecified atrial fibrillation: Secondary | ICD-10-CM | POA: Insufficient documentation

## 2013-07-17 DIAGNOSIS — Z7982 Long term (current) use of aspirin: Secondary | ICD-10-CM | POA: Insufficient documentation

## 2013-07-17 DIAGNOSIS — W06XXXA Fall from bed, initial encounter: Secondary | ICD-10-CM | POA: Insufficient documentation

## 2013-07-17 DIAGNOSIS — F39 Unspecified mood [affective] disorder: Secondary | ICD-10-CM | POA: Insufficient documentation

## 2013-07-17 DIAGNOSIS — J42 Unspecified chronic bronchitis: Secondary | ICD-10-CM | POA: Insufficient documentation

## 2013-07-17 DIAGNOSIS — W19XXXA Unspecified fall, initial encounter: Secondary | ICD-10-CM

## 2013-07-17 DIAGNOSIS — D649 Anemia, unspecified: Secondary | ICD-10-CM | POA: Insufficient documentation

## 2013-07-17 DIAGNOSIS — Z87891 Personal history of nicotine dependence: Secondary | ICD-10-CM | POA: Insufficient documentation

## 2013-07-17 DIAGNOSIS — E039 Hypothyroidism, unspecified: Secondary | ICD-10-CM | POA: Insufficient documentation

## 2013-07-17 DIAGNOSIS — G2581 Restless legs syndrome: Secondary | ICD-10-CM | POA: Insufficient documentation

## 2013-07-17 DIAGNOSIS — Z95810 Presence of automatic (implantable) cardiac defibrillator: Secondary | ICD-10-CM | POA: Insufficient documentation

## 2013-07-17 DIAGNOSIS — S2241XA Multiple fractures of ribs, right side, initial encounter for closed fracture: Secondary | ICD-10-CM

## 2013-07-17 DIAGNOSIS — S2249XA Multiple fractures of ribs, unspecified side, initial encounter for closed fracture: Principal | ICD-10-CM

## 2013-07-17 DIAGNOSIS — R0902 Hypoxemia: Secondary | ICD-10-CM

## 2013-07-17 DIAGNOSIS — G4733 Obstructive sleep apnea (adult) (pediatric): Secondary | ICD-10-CM | POA: Insufficient documentation

## 2013-07-17 DIAGNOSIS — K219 Gastro-esophageal reflux disease without esophagitis: Secondary | ICD-10-CM | POA: Insufficient documentation

## 2013-07-17 DIAGNOSIS — M1990 Unspecified osteoarthritis, unspecified site: Secondary | ICD-10-CM | POA: Insufficient documentation

## 2013-07-17 DIAGNOSIS — I5022 Chronic systolic (congestive) heart failure: Secondary | ICD-10-CM | POA: Insufficient documentation

## 2013-07-17 DIAGNOSIS — F0390 Unspecified dementia without behavioral disturbance: Secondary | ICD-10-CM | POA: Insufficient documentation

## 2013-07-17 DIAGNOSIS — S2239XA Fracture of one rib, unspecified side, initial encounter for closed fracture: Secondary | ICD-10-CM

## 2013-07-17 DIAGNOSIS — H409 Unspecified glaucoma: Secondary | ICD-10-CM | POA: Insufficient documentation

## 2013-07-17 DIAGNOSIS — J310 Chronic rhinitis: Secondary | ICD-10-CM | POA: Insufficient documentation

## 2013-07-17 DIAGNOSIS — I509 Heart failure, unspecified: Secondary | ICD-10-CM | POA: Insufficient documentation

## 2013-07-17 DIAGNOSIS — Y9210 Unspecified residential institution as the place of occurrence of the external cause: Secondary | ICD-10-CM | POA: Insufficient documentation

## 2013-07-17 DIAGNOSIS — N189 Chronic kidney disease, unspecified: Secondary | ICD-10-CM | POA: Insufficient documentation

## 2013-07-17 LAB — CBC WITH DIFFERENTIAL/PLATELET
BASOS PCT: 0 % (ref 0–1)
Basophils Absolute: 0 10*3/uL (ref 0.0–0.1)
Eosinophils Absolute: 0.4 10*3/uL (ref 0.0–0.7)
Eosinophils Relative: 5 % (ref 0–5)
HEMATOCRIT: 33 % — AB (ref 36.0–46.0)
Hemoglobin: 10.8 g/dL — ABNORMAL LOW (ref 12.0–15.0)
LYMPHS ABS: 0.8 10*3/uL (ref 0.7–4.0)
LYMPHS PCT: 12 % (ref 12–46)
MCH: 31.9 pg (ref 26.0–34.0)
MCHC: 32.7 g/dL (ref 30.0–36.0)
MCV: 97.3 fL (ref 78.0–100.0)
MONOS PCT: 7 % (ref 3–12)
Monocytes Absolute: 0.5 10*3/uL (ref 0.1–1.0)
NEUTROS ABS: 5 10*3/uL (ref 1.7–7.7)
NEUTROS PCT: 76 % (ref 43–77)
Platelets: 117 10*3/uL — ABNORMAL LOW (ref 150–400)
RBC: 3.39 MIL/uL — AB (ref 3.87–5.11)
RDW: 15.9 % — ABNORMAL HIGH (ref 11.5–15.5)
WBC: 6.6 10*3/uL (ref 4.0–10.5)

## 2013-07-17 LAB — BASIC METABOLIC PANEL
BUN: 25 mg/dL — ABNORMAL HIGH (ref 6–23)
CHLORIDE: 103 meq/L (ref 96–112)
CO2: 27 meq/L (ref 19–32)
Calcium: 10.1 mg/dL (ref 8.4–10.5)
Creatinine, Ser: 1.47 mg/dL — ABNORMAL HIGH (ref 0.50–1.10)
GFR calc Af Amer: 36 mL/min — ABNORMAL LOW (ref >90)
GFR calc non Af Amer: 31 mL/min — ABNORMAL LOW (ref >90)
GLUCOSE: 105 mg/dL — AB (ref 70–99)
POTASSIUM: 4.3 meq/L (ref 3.7–5.3)
Sodium: 144 mEq/L (ref 137–147)

## 2013-07-17 LAB — URINALYSIS, ROUTINE W REFLEX MICROSCOPIC
Bilirubin Urine: NEGATIVE
Glucose, UA: NEGATIVE mg/dL
HGB URINE DIPSTICK: NEGATIVE
Ketones, ur: NEGATIVE mg/dL
Leukocytes, UA: NEGATIVE
NITRITE: NEGATIVE
PH: 7 (ref 5.0–8.0)
Protein, ur: NEGATIVE mg/dL
SPECIFIC GRAVITY, URINE: 1.011 (ref 1.005–1.030)
Urobilinogen, UA: 1 mg/dL (ref 0.0–1.0)

## 2013-07-17 LAB — IRON AND TIBC
IRON: 45 ug/dL (ref 42–135)
Saturation Ratios: 17 % — ABNORMAL LOW (ref 20–55)
TIBC: 266 ug/dL (ref 250–470)
UIBC: 221 ug/dL (ref 125–400)

## 2013-07-17 LAB — RETICULOCYTES
RBC.: 3.28 MIL/uL — ABNORMAL LOW (ref 3.87–5.11)
RETIC COUNT ABSOLUTE: 98.4 10*3/uL (ref 19.0–186.0)
Retic Ct Pct: 3 % (ref 0.4–3.1)

## 2013-07-17 LAB — TROPONIN I

## 2013-07-17 MED ORDER — ALLOPURINOL 100 MG PO TABS
100.0000 mg | ORAL_TABLET | Freq: Every day | ORAL | Status: DC
  Administered 2013-07-18 – 2013-07-19 (×2): 100 mg via ORAL
  Filled 2013-07-17 (×2): qty 1

## 2013-07-17 MED ORDER — SODIUM CHLORIDE 0.9 % IJ SOLN: 3.0000 mL | INTRAMUSCULAR | Status: DC | PRN

## 2013-07-17 MED ORDER — TRAMADOL HCL 50 MG PO TABS
50.0000 mg | ORAL_TABLET | Freq: Once | ORAL | Status: AC
  Administered 2013-07-17: 50 mg via ORAL
  Filled 2013-07-17: qty 1

## 2013-07-17 MED ORDER — FENTANYL 25 MCG/HR TD PT72
25.0000 ug | MEDICATED_PATCH | TRANSDERMAL | Status: DC
  Administered 2013-07-19: 25 ug via TRANSDERMAL
  Filled 2013-07-17: qty 1

## 2013-07-17 MED ORDER — CARVEDILOL 6.25 MG PO TABS
6.2500 mg | ORAL_TABLET | Freq: Two times a day (BID) | ORAL | Status: DC
  Administered 2013-07-17 – 2013-07-19 (×5): 6.25 mg via ORAL
  Filled 2013-07-17 (×6): qty 1

## 2013-07-17 MED ORDER — PANTOPRAZOLE SODIUM 40 MG PO TBEC
40.0000 mg | DELAYED_RELEASE_TABLET | Freq: Two times a day (BID) | ORAL | Status: DC
  Administered 2013-07-18 – 2013-07-19 (×4): 40 mg via ORAL
  Filled 2013-07-17 (×4): qty 1

## 2013-07-17 MED ORDER — SODIUM CHLORIDE 0.9 % IV SOLN: 250.0000 mL | INTRAVENOUS | Status: DC | PRN

## 2013-07-17 MED ORDER — HYDRALAZINE HCL 10 MG PO TABS
10.0000 mg | ORAL_TABLET | Freq: Three times a day (TID) | ORAL | Status: DC
  Administered 2013-07-17 – 2013-07-18 (×3): 10 mg via ORAL
  Filled 2013-07-17 (×8): qty 1

## 2013-07-17 MED ORDER — LEVOTHYROXINE SODIUM 88 MCG PO TABS
88.0000 ug | ORAL_TABLET | Freq: Every day | ORAL | Status: DC
  Administered 2013-07-18 – 2013-07-19 (×2): 88 ug via ORAL
  Filled 2013-07-17 (×3): qty 1

## 2013-07-17 MED ORDER — OXYCODONE HCL 5 MG PO TABS
10.0000 mg | ORAL_TABLET | ORAL | Status: DC | PRN
  Administered 2013-07-17 – 2013-07-19 (×6): 10 mg via ORAL
  Filled 2013-07-17 (×7): qty 2

## 2013-07-17 MED ORDER — MORPHINE SULFATE 2 MG/ML IJ SOLN
1.0000 mg | INTRAMUSCULAR | Status: DC | PRN
  Administered 2013-07-17 – 2013-07-18 (×2): 1 mg via INTRAVENOUS
  Filled 2013-07-17 (×2): qty 1

## 2013-07-17 MED ORDER — PANTOPRAZOLE SODIUM 40 MG PO TBEC: 40.0000 mg | DELAYED_RELEASE_TABLET | Freq: Two times a day (BID) | ORAL | Status: DC

## 2013-07-17 MED ORDER — CALCIUM CARBONATE 1250 (500 CA) MG PO TABS
1.0000 | ORAL_TABLET | Freq: Two times a day (BID) | ORAL | Status: DC
  Administered 2013-07-18 – 2013-07-19 (×4): 500 mg via ORAL
  Filled 2013-07-17 (×5): qty 1

## 2013-07-17 MED ORDER — FUROSEMIDE 40 MG PO TABS
40.0000 mg | ORAL_TABLET | Freq: Two times a day (BID) | ORAL | Status: DC
  Administered 2013-07-18 – 2013-07-19 (×3): 40 mg via ORAL
  Filled 2013-07-17 (×5): qty 1

## 2013-07-17 MED ORDER — ONDANSETRON HCL 4 MG PO TABS: 4.0000 mg | ORAL_TABLET | Freq: Four times a day (QID) | ORAL | Status: DC | PRN

## 2013-07-17 MED ORDER — IOHEXOL 300 MG/ML  SOLN
70.0000 mL | Freq: Once | INTRAMUSCULAR | Status: AC | PRN
  Administered 2013-07-17: 70 mL via INTRAVENOUS

## 2013-07-17 MED ORDER — SERTRALINE HCL 100 MG PO TABS
100.0000 mg | ORAL_TABLET | Freq: Every day | ORAL | Status: DC
  Administered 2013-07-18 – 2013-07-19 (×2): 100 mg via ORAL
  Filled 2013-07-17 (×2): qty 1

## 2013-07-17 MED ORDER — ONDANSETRON HCL 4 MG/2ML IJ SOLN: 4.0000 mg | Freq: Four times a day (QID) | INTRAMUSCULAR | Status: DC | PRN

## 2013-07-17 MED ORDER — ASPIRIN EC 81 MG PO TBEC
81.0000 mg | DELAYED_RELEASE_TABLET | Freq: Every day | ORAL | Status: DC
  Administered 2013-07-18 – 2013-07-19 (×2): 81 mg via ORAL
  Filled 2013-07-17 (×2): qty 1

## 2013-07-17 MED ORDER — ENOXAPARIN SODIUM 30 MG/0.3ML ~~LOC~~ SOLN
30.0000 mg | SUBCUTANEOUS | Status: DC
  Filled 2013-07-17 (×3): qty 0.3

## 2013-07-17 MED ORDER — POLYVINYL ALCOHOL 1.4 % OP SOLN
1.0000 [drp] | Freq: Three times a day (TID) | OPHTHALMIC | Status: DC
  Administered 2013-07-17 – 2013-07-19 (×5): 1 [drp] via OPHTHALMIC
  Filled 2013-07-17: qty 15

## 2013-07-17 MED ORDER — SODIUM CHLORIDE 0.9 % IJ SOLN
3.0000 mL | Freq: Two times a day (BID) | INTRAMUSCULAR | Status: DC
  Administered 2013-07-17 – 2013-07-19 (×3): 3 mL via INTRAVENOUS

## 2013-07-17 MED ORDER — SENNOSIDES-DOCUSATE SODIUM 8.6-50 MG PO TABS
1.0000 | ORAL_TABLET | Freq: Every day | ORAL | Status: DC
  Administered 2013-07-17: 1 via ORAL
  Filled 2013-07-17: qty 1

## 2013-07-17 MED ORDER — ISOSORBIDE MONONITRATE ER 30 MG PO TB24
30.0000 mg | ORAL_TABLET | Freq: Every day | ORAL | Status: DC
  Administered 2013-07-18 – 2013-07-19 (×2): 30 mg via ORAL
  Filled 2013-07-17 (×2): qty 1

## 2013-07-17 NOTE — ED Notes (Signed)
Pt reports she was sitting on edge of bed this morning and fell to the ground when trying to transfer to her wheelchair. Pt denies LOC or head injury but c/o R side pain.

## 2013-07-17 NOTE — ED Notes (Signed)
Pt returned from CT °

## 2013-07-17 NOTE — ED Notes (Signed)
Called 4N to give report on phone number [phone]. Receiving RN did not answer and it rang back to the desk. RN told to keep trying to call.

## 2013-07-17 NOTE — ED Notes (Signed)
Pt arrived by [facility] with c/o right flank pain post fall this morning. Fall was un witness but denies hitting head or LOC. Pt was trying to go from bed to w/c and fell. Dime size hematoma noted to right flank area. Denies neck pain. Pt has Fentanyl patch and was given Hydrocodone prior to EMS arrival. Currently pt has 7/10 pain.

## 2013-07-17 NOTE — ED Notes (Signed)
O2 saturation was low.  Put on 2 liters nasal canula.

## 2013-07-17 NOTE — ED Notes (Signed)
MD at bedside. 

## 2013-07-17 NOTE — ED Notes (Signed)
Pt. returned from XR. 

## 2013-07-17 NOTE — ED Notes (Signed)
4N to give report.  Charge nurse requested a call back in 5 minutes

## 2013-07-17 NOTE — H&P (Signed)
Internal Medicine Attending Admission Note Date: 07/17/2013  Patient name: [name] [name] [facility] Medical record number: [id number] Date of birth: 25-Dec-1924 Age: 80 y.o. Gender: female  I saw and evaluated the patient. I reviewed the resident's note and I agree with the resident's findings and plan as documented in the resident's note, with the following additional comments.  Chief Complaint(s): Right lateral rib pain following a fall  History - key components related to admission: Patient is an 80 year old woman, resident of an ALF, who reportedly fell while transferring from her bed to a wheel chair.  Patient denies syncope, and has no current complaints other than the rib pain.     Physical Exam - key components related to admission:  Filed Vitals:   07/17/13 1600 07/17/13 1700 07/17/13 1741 07/17/13 1800  BP: 102/44 113/53 113/53 99/46  Pulse: 68 69 66 67  Temp:      TempSrc:      Resp: 20 20 24 17   SpO2: 97% 97% 97% 97%   General: Alert, oriented to place, person, and situation Lungs: Clear Heart: Regular, no extra sounds or murmurs Chest: Tender over right lateral ribs; there is a small area of ecchymosis present. Abdomen: bowel sounds, present, soft, non-tender Extremities: No edema  Lab results:   Basic Metabolic Panel:  Recent Labs  07/17/13 1145  NA 144  K 4.3  CL 103  CO2 27  GLUCOSE 105*  BUN 25*  CREATININE 1.47*  CALCIUM 10.1     CBC:  Recent Labs  07/17/13 1145  WBC 6.6  HGB 10.8*  HCT 33.0*  MCV 97.3  PLT 117*    Recent Labs  07/17/13 1145  NEUTROABS 5.0  LYMPHSABS 0.8  MONOABS 0.5  EOSABS 0.4  BASOSABS 0.0    Urinalysis    Component Value Date/Time   COLORURINE YELLOW 07/17/2013 1100   APPEARANCEUR CLEAR 07/17/2013 1100   LABSPEC 1.011 07/17/2013 1100   PHURINE 7.0 07/17/2013 1100   GLUCOSEU NEGATIVE 07/17/2013 1100   HGBUR NEGATIVE 07/17/2013 1100   BILIRUBINUR NEGATIVE 07/17/2013 1100   KETONESUR NEGATIVE 07/17/2013 1100    PROTEINUR NEGATIVE 07/17/2013 1100   UROBILINOGEN 1.0 07/17/2013 1100   NITRITE NEGATIVE 07/17/2013 1100   LEUKOCYTESUR NEGATIVE 07/17/2013 1100    Imaging results:  Dg Ribs Unilateral W/chest Right  07/17/2013   CLINICAL DATA:  Status post fall now with right-sided chest and rib discomfort  EXAM: RIGHT RIBS AND CHEST - 3+ VIEW  COMPARISON:  None.  FINDINGS: The lungs are mildly hypoinflated. No pneumothorax or significant pleural effusion is demonstrated. The cardiac silhouette is mildly enlarged. A permanent pacemaker-defibrillator is in place. The pulmonary vascularity is not engorged.  There is old deformity of the posterior lateral aspect of the right fifth rib. There are acute fractures of the right eighth through tenth ribs anterior laterally. There may be subtle fractures of the right sixth and seventh ribs as well. The right clavicle appears intact.  IMPRESSION: 1. The patient has sustained acute fractures of the right eighth through tenth ribs anterolaterally. The sixth and seventh ribs may be involved as well. There is an old fracture of the posterior lateral aspect of the right fifth rib. 2. There is no evidence of a pneumothorax nor significant pleural effusion. 3. There is stable enlargement of the cardiac silhouette with mild stable prominence of the pulmonary interstitial markings. These lung markings are accentuated by mild hypoinflation.   Electronically Signed   By: David  Martinique   On:  07/17/2013 12:20   Ct Chest W Contrast  07/17/2013   CLINICAL DATA Right flank pain post fall this morning, unwitnessed, history systolic heart failure, atrial fibrillation, smoking  EXAM CT CHEST, ABDOMEN, AND PELVIS WITH CONTRAST  TECHNIQUE Multidetector CT imaging of the chest, abdomen and pelvis was performed following the standard protocol during bolus administration of intravenous contrast. Sagittal and coronal MPR images reconstructed from axial data set.  CONTRAST 38mL OMNIPAQUE IOHEXOL 300 MG/ML  SOLN. No oral contrast administered.  COMPARISON None  FINDINGS CT CHEST FINDINGS  Left subclavian AICD leads located at right atrium, right ventricle and coronary sinus.  Scattered atherosclerotic calcifications aorta, great vessels, and coronary arteries.  No thoracic adenopathy.  Moderate-sized hiatal hernia.  Bibasilar atelectasis.  Peripheral scattered interstitial changes in the upper lobes early question fibrosis.  No segmental infiltrate, pleural effusion, or pneumothorax.  Minimally displaced fractures of the posterior right tenth and eleventh ribs.  Questionable sclerosis of the left posterior eighth rib  CT ABDOMEN AND PELVIS FINDINGS  6 mm cyst right lobe liver superiorly image 36.  Additional tiny nonspecific low-attenuation foci in liver 5 mm diameter or less.  Tiny nonobstructing calculus at lower pole right kidney image 66.  Liver, spleen, pancreas, kidneys, and adrenal glands normal appearance.  Scattered atherosclerotic calcification.  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  Minimal free pelvic fluid, low attenuation.  Nonspecific small subcutaneous hematoma right flank.  No mass, adenopathy, hernia, or free air.  Osseous demineralization with orthopedic hardware proximal right femur.  Compression deformities at superior endplate of L4 and inferior endplate of L1.  Mixed lytic and sclerotic process of T11 vertebral body.  IMPRESSION Moderate-sized hiatal hernia.  Bibasilar atelectasis and question minimal peripheral pulmonary fibrosis.  Fractures right posterior tenth and eleventh ribs.  No acute intra-abdominal or intrapelvic abnormalities.  Questionable sclerosis left eighth rib.  Mixed lytic and sclerotic process involving the T11 vertebral body, of uncertain age, not previously imaged ; metastatic disease not excluded, does patient have a history of malignancy?  Consider followup nonemergent radionuclide bone scan for further osseous assessment.  SIGNATURE  Electronically Signed    By: Lavonia Dana M.D.   On: 07/17/2013 14:47   Ct Abdomen Pelvis W Contrast  07/17/2013   CLINICAL DATA Right flank pain post fall this morning, unwitnessed, history systolic heart failure, atrial fibrillation, smoking  EXAM CT CHEST, ABDOMEN, AND PELVIS WITH CONTRAST  TECHNIQUE Multidetector CT imaging of the chest, abdomen and pelvis was performed following the standard protocol during bolus administration of intravenous contrast. Sagittal and coronal MPR images reconstructed from axial data set.  CONTRAST 53mL OMNIPAQUE IOHEXOL 300 MG/ML SOLN. No oral contrast administered.  COMPARISON None  FINDINGS CT CHEST FINDINGS  Left subclavian AICD leads located at right atrium, right ventricle and coronary sinus.  Scattered atherosclerotic calcifications aorta, great vessels, and coronary arteries.  No thoracic adenopathy.  Moderate-sized hiatal hernia.  Bibasilar atelectasis.  Peripheral scattered interstitial changes in the upper lobes early question fibrosis.  No segmental infiltrate, pleural effusion, or pneumothorax.  Minimally displaced fractures of the posterior right tenth and eleventh ribs.  Questionable sclerosis of the left posterior eighth rib  CT ABDOMEN AND PELVIS FINDINGS  6 mm cyst right lobe liver superiorly image 36.  Additional tiny nonspecific low-attenuation foci in liver 5 mm diameter or less.  Tiny nonobstructing calculus at lower pole right kidney image 66.  Liver, spleen, pancreas, kidneys, and adrenal glands normal appearance.  Scattered atherosclerotic calcification.  Unremarkable bladder  with surgical absence of uterus.  Minimal free pelvic fluid, low attenuation.  Nonspecific small subcutaneous hematoma right flank.  No mass, adenopathy, hernia, or free air.  Osseous demineralization with orthopedic hardware proximal right femur.  Compression deformities at superior endplate of L4 and inferior endplate of L1.  Mixed lytic and sclerotic process of T11 vertebral body.  IMPRESSION  Moderate-sized hiatal hernia.  Bibasilar atelectasis and question minimal peripheral pulmonary fibrosis.  Fractures right posterior tenth and eleventh ribs.  No acute intra-abdominal or intrapelvic abnormalities.  Questionable sclerosis left eighth rib.  Mixed lytic and sclerotic process involving the T11 vertebral body, of uncertain age, not previously imaged ; metastatic disease not excluded, does patient have a history of malignancy?  Consider followup nonemergent radionuclide bone scan for further osseous assessment.  SIGNATURE  Electronically Signed   By: Lavonia Dana M.D.   On: 07/17/2013 14:47    Other results: EKG: Sinus rhythm; nonspecific intraventricular conduction delay; borderline repolarization abnormality  Assessment & Plan by Problem:  1. Rib fractures.  These are minimally displaced; management includes pain control and supportive care.  Patient is on a fentanyl patch chronically, but according to her daughter she takes the oral pain medications only as needed.  The plan is to continue fentanyl patch and supplement with oxycodone as needed, with attention to avoid oversedation.   2.  Fall.  This sounds like a mechanical fall.  Plan is OT/PT consult.  3.  Other problems and plans as per the resident physician's note.

## 2013-07-17 NOTE — ED Provider Notes (Signed)
CSN: [id number]     Arrival date & time 07/17/13  1017 History   First MD Initiated Contact with Patient 07/17/13 1026     Chief Complaint  Patient presents with  . Fall     (Consider location/radiation/quality/duration/timing/severity/associated sxs/prior Treatment) Patient is a 80 y.o. female presenting with fall. The history is provided by the patient and a relative.  Fall   She was attempting to transfer from bed to wheelchair, when she fell. No one saw the fall. She activated her Lif[employer] and assistance from caregivers at her Asst. [place]. He is unable to give additional history.  Level 5 caveat- dementia  Past Medical History  Diagnosis Date  . Atrial fibrillation   . BRONCHITIS, CHRONIC   . CHEST PAIN   . HYPOKALEMIA   . OBSTRUCTIVE SLEEP APNEA   . Palpitations   . SYSTOLIC HEART FAILURE, CHRONIC   . Depression   . CPAP (continuous positive airway pressure) dependence   . ACE-inhibitor cough   . Cardiac defibrillator in situ   . Chronic renal insufficiency   . GERD (gastroesophageal reflux disease)   . Hypothyroidism   . Hematuria   . Osteoarthritis (arthritis due to wear and tear of joints)   . Colon polyps   . Chronic rhinitis   . RLS (restless legs syndrome)   . Chronic back pain   . Arthritis   . Glaucoma   . Sleep apnea    Past Surgical History  Procedure Laterality Date  . Back surgery    . Cardiac defibrillator placement  2005     Pump Back [phone]  . Skin cancer excision    . Cardiac catheterization  07/29/2003     Congestive heart failure, questionable etiology.  Increased LVEDP of 28mmHg on cath --  Nonobstructive coronary disease --         . Insert / replace / remove pacemaker  01/10/2004    Aborted biventricular implantable cardiac defibrillator implantation, complicated by probable pneumothorax  --  Deboraha Sprang, M.D.  . Atrial cardiac pacemaker insertion  01/15/2004    St. Jude 1581 65 cm dual coil active fixation  defibrillator lead, serial number [id number] --  St. Jude Atlas +HF model V340CRT defibrillator, serial [id number]. -- Biventricular implantable cardioverter-defibrillator implantation, contrast venography and intraoperative defibrillation threshold testing -- SURGEON:  Deboraha Sprang, M.D.  . Cardiac pacemaker placement  03/17/2009    generator change  --  [place] ICD model [phone], serial number [id number]  -- Deboraha Sprang, MD, [facility]   . Femur fracture surgery    . Esophagogastroduodenoscopy  04/13/2011    Procedure: ESOPHAGOGASTRODUODENOSCOPY (EGD);  Surgeon: Zenovia Jarred, MD;  Location: [facility];  Service: Gastroenterology;  Laterality: N/A;  may need dilation   Family History  Problem Relation Age of Onset  . Heart disease Mother   . Colon cancer Mother   . Colon polyps Mother   . Emphysema Father   . Liver disease Brother   . Kidney disease Mother   . Kidney disease Maternal Grandmother    History  Substance Use Topics  . Smoking status: Former Smoker -- 1.00 packs/day    Types: Cigarettes    Quit date: 04/10/2003  . Smokeless tobacco: Never Used  . Alcohol Use: No   OB History   Grav Para Term Preterm Abortions TAB SAB Ect Mult Living                 Review of Systems  Unable to perform ROS     Allergies  Review of patient's allergies indicates no known allergies.  Home Medications   Current Outpatient Rx  Name  Route  Sig  Dispense  Refill  . allopurinol (ZYLOPRIM) 100 MG tablet   Oral   Take 100 mg by mouth daily.           . Ascorbic Acid (VITAMIN C) 500 MG tablet   Oral   Take 500 mg by mouth daily.           [name] aspirin EC 81 MG tablet   Oral   Take 1 tablet (81 mg total) by mouth daily.         . calcium carbonate (OS-CAL - DOSED IN MG OF ELEMENTAL CALCIUM) 1250 MG tablet   Oral   Take 1 tablet by mouth 2 (two) times daily with a meal.         . carvedilol (COREG) 6.25 MG tablet   Oral   Take 6.25 mg by mouth 2 (two) times daily with a  meal.         . ergocalciferol (VITAMIN D2) 50000 UNITS capsule   Oral   Take 50,000 Units by mouth once a week. On wednesdays         . fentaNYL (DURAGESIC - DOSED MCG/HR) 25 MCG/HR   Transdermal   Place 1 patch onto the skin every 3 (three) days.           . furosemide (LASIX) 40 MG tablet   Oral   Take 40 mg by mouth 2 (two) times daily.          . hydrALAZINE (APRESOLINE) 10 MG tablet   Oral   Take 10 mg by mouth 3 (three) times daily. 3 times daily, breakfast, 2:00 pm and bedtime         . HYDROcodone-acetaminophen (NORCO/VICODIN) 5-325 MG per tablet   Oral   Take 1 tablet by mouth every 8 (eight) hours as needed (chronic back pain).         . isosorbide mononitrate (IMDUR) 30 MG 24 hr tablet   Oral   Take 30 mg by mouth daily.         [name] levothyroxine (SYNTHROID, LEVOTHROID) 88 MCG tablet   Oral   Take 88 mcg by mouth daily.           [name] neomycin-bacitracin-polymyxin (NEOSPORIN) ointment   Topical   Apply 1 application topically daily as needed for wound care. apply to eye         . pantoprazole (PROTONIX) 40 MG tablet   Oral   Take 40 mg by mouth 2 (two) times daily before a meal.         . polyvinyl alcohol (LIQUIFILM TEARS) 1.4 % ophthalmic solution   Both Eyes   Place 1 drop into both eyes 3 (three) times daily.         . potassium chloride SA (K-DUR,KLOR-CON) 20 MEQ tablet   Oral   Take 40 mEq by mouth 2 (two) times daily.          . sertraline (ZOLOFT) 100 MG tablet   Oral   Take 100 mg by mouth daily.           Dema Severin Petrolatum-Mineral Oil (ARTIFICIAL TEARS) OINT ophthalmic ointment   Both Eyes   Place 1 application into both eyes at bedtime.         . diphenhydramine-acetaminophen (TYLENOL PM) 25-500 MG TABS  Oral   Take 2 tablets by mouth at bedtime as needed (for sleep).          . OxyCODONE HCl, Abuse Deter, 5 MG TABS   Oral   Take 1 tablet by mouth every 8 (eight) hours as needed (pain).           BP  123/54  Pulse 70  Temp(Src) 97 F (36.1 C) (Oral)  Resp 21  SpO2 87% Physical Exam  Nursing note and vitals reviewed. Constitutional: She appears well-developed.  Elderly, frail  HENT:  Head: Normocephalic and atraumatic.  Eyes: Conjunctivae and EOM are normal. Pupils are equal, round, and reactive to light.  Neck: Normal range of motion and phonation normal. Neck supple.  Cardiovascular: Normal rate, regular rhythm and intact distal pulses.   Pulmonary/Chest: Effort normal and breath sounds normal. She exhibits tenderness (views right anterior, lateral chest wall tenderness, without crepitation).  Abdominal: Soft. She exhibits no distension and no mass. There is no tenderness. There is no guarding.  Musculoskeletal: Normal range of motion.  Neurological: She is alert. She exhibits normal muscle tone. Coordination normal.  Skin: Skin is warm and dry.  Psychiatric: She has a normal mood and affect. Her behavior is normal.    ED Course  Procedures (including critical care time)  Medications  iohexol (OMNIPAQUE) 300 MG/ML solution 70 mL (70 mLs Intravenous Contrast Given 07/17/13 1417)    Patient Vitals for the past 24 hrs:  BP Temp Temp src Pulse Resp SpO2  07/17/13 1500 123/54 mmHg - - 70 21 87 %  07/17/13 1350 - - - 66 19 -  07/17/13 1300 92/49 mmHg - - - - -  07/17/13 1245 - - - 67 20 93 %  07/17/13 1100 119/54 mmHg - - 66 24 93 %  07/17/13 1018 114/61 mmHg 97 F (36.1 C) Oral 71 20 96 %    3:41 PM Reevaluation with update and discussion. After initial assessment and treatment, an updated evaluation reveals no further complaints. Oxygen saturation was low at 87%, but improved, to normal, with nasal cannula oxygen. Rochel Privett L   3:35 PM-Consult complete with on-call resident. Patient case explained and discussed. She agrees to admit patient for further evaluation and treatment. Call ended at 1555  Labs Review Labs Reviewed  CBC WITH DIFFERENTIAL - Abnormal; Notable  for the following:    RBC 3.39 (*)    Hemoglobin 10.8 (*)    HCT 33.0 (*)    RDW 15.9 (*)    Platelets 117 (*)    All other components within normal limits  BASIC METABOLIC PANEL - Abnormal; Notable for the following:    Glucose, Bld 105 (*)    BUN 25 (*)    Creatinine, Ser 1.47 (*)    GFR calc non Af Amer 31 (*)    GFR calc Af Amer 36 (*)    All other components within normal limits  URINE CULTURE  URINALYSIS, ROUTINE W REFLEX MICROSCOPIC   Imaging Review Dg Ribs Unilateral W/chest Right  07/17/2013   CLINICAL DATA:  Status post fall now with right-sided chest and rib discomfort  EXAM: RIGHT RIBS AND CHEST - 3+ VIEW  COMPARISON:  None.  FINDINGS: The lungs are mildly hypoinflated. No pneumothorax or significant pleural effusion is demonstrated. The cardiac silhouette is mildly enlarged. A permanent pacemaker-defibrillator is in place. The pulmonary vascularity is not engorged.  There is old deformity of the posterior lateral aspect of the right fifth rib. There are acute  fractures of the right eighth through tenth ribs anterior laterally. There may be subtle fractures of the right sixth and seventh ribs as well. The right clavicle appears intact.  IMPRESSION: 1. The patient has sustained acute fractures of the right eighth through tenth ribs anterolaterally. The sixth and seventh ribs may be involved as well. There is an old fracture of the posterior lateral aspect of the right fifth rib. 2. There is no evidence of a pneumothorax nor significant pleural effusion. 3. There is stable enlargement of the cardiac silhouette with mild stable prominence of the pulmonary interstitial markings. These lung markings are accentuated by mild hypoinflation.   Electronically Signed   By: David  Martinique   On: 07/17/2013 12:20   Ct Chest W Contrast  07/17/2013   CLINICAL DATA Right flank pain post fall this morning, unwitnessed, history systolic heart failure, atrial fibrillation, smoking  EXAM CT CHEST,  ABDOMEN, AND PELVIS WITH CONTRAST  TECHNIQUE Multidetector CT imaging of the chest, abdomen and pelvis was performed following the standard protocol during bolus administration of intravenous contrast. Sagittal and coronal MPR images reconstructed from axial data set.  CONTRAST 7mL OMNIPAQUE IOHEXOL 300 MG/ML SOLN. No oral contrast administered.  COMPARISON None  FINDINGS CT CHEST FINDINGS  Left subclavian AICD leads located at right atrium, right ventricle and coronary sinus.  Scattered atherosclerotic calcifications aorta, great vessels, and coronary arteries.  No thoracic adenopathy.  Moderate-sized hiatal hernia.  Bibasilar atelectasis.  Peripheral scattered interstitial changes in the upper lobes early question fibrosis.  No segmental infiltrate, pleural effusion, or pneumothorax.  Minimally displaced fractures of the posterior right tenth and eleventh ribs.  Questionable sclerosis of the left posterior eighth rib  CT ABDOMEN AND PELVIS FINDINGS  6 mm cyst right lobe liver superiorly image 36.  Additional tiny nonspecific low-attenuation foci in liver 5 mm diameter or less.  Tiny nonobstructing calculus at lower pole right kidney image 66.  Liver, spleen, pancreas, kidneys, and adrenal glands normal appearance.  Scattered atherosclerotic calcification.  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  Minimal free pelvic fluid, low attenuation.  Nonspecific small subcutaneous hematoma right flank.  No mass, adenopathy, hernia, or free air.  Osseous demineralization with orthopedic hardware proximal right femur.  Compression deformities at superior endplate of L4 and inferior endplate of L1.  Mixed lytic and sclerotic process of T11 vertebral body.  IMPRESSION Moderate-sized hiatal hernia.  Bibasilar atelectasis and question minimal peripheral pulmonary fibrosis.  Fractures right posterior tenth and eleventh ribs.  No acute intra-abdominal or intrapelvic abnormalities.  Questionable sclerosis left eighth  rib.  Mixed lytic and sclerotic process involving the T11 vertebral body, of uncertain age, not previously imaged ; metastatic disease not excluded, does patient have a history of malignancy?  Consider followup nonemergent radionuclide bone scan for further osseous assessment.  SIGNATURE  Electronically Signed   By: Lavonia Dana M.D.   On: 07/17/2013 14:47   Ct Abdomen Pelvis W Contrast  07/17/2013   CLINICAL DATA Right flank pain post fall this morning, unwitnessed, history systolic heart failure, atrial fibrillation, smoking  EXAM CT CHEST, ABDOMEN, AND PELVIS WITH CONTRAST  TECHNIQUE Multidetector CT imaging of the chest, abdomen and pelvis was performed following the standard protocol during bolus administration of intravenous contrast. Sagittal and coronal MPR images reconstructed from axial data set.  CONTRAST 59mL OMNIPAQUE IOHEXOL 300 MG/ML SOLN. No oral contrast administered.  COMPARISON None  FINDINGS CT CHEST FINDINGS  Left subclavian AICD leads located at right atrium, right ventricle  and coronary sinus.  Scattered atherosclerotic calcifications aorta, great vessels, and coronary arteries.  No thoracic adenopathy.  Moderate-sized hiatal hernia.  Bibasilar atelectasis.  Peripheral scattered interstitial changes in the upper lobes early question fibrosis.  No segmental infiltrate, pleural effusion, or pneumothorax.  Minimally displaced fractures of the posterior right tenth and eleventh ribs.  Questionable sclerosis of the left posterior eighth rib  CT ABDOMEN AND PELVIS FINDINGS  6 mm cyst right lobe liver superiorly image 36.  Additional tiny nonspecific low-attenuation foci in liver 5 mm diameter or less.  Tiny nonobstructing calculus at lower pole right kidney image 66.  Liver, spleen, pancreas, kidneys, and adrenal glands normal appearance.  Scattered atherosclerotic calcification.  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  Minimal free pelvic fluid, low attenuation.  Nonspecific small  subcutaneous hematoma right flank.  No mass, adenopathy, hernia, or free air.  Osseous demineralization with orthopedic hardware proximal right femur.  Compression deformities at superior endplate of L4 and inferior endplate of L1.  Mixed lytic and sclerotic process of T11 vertebral body.  IMPRESSION Moderate-sized hiatal hernia.  Bibasilar atelectasis and question minimal peripheral pulmonary fibrosis.  Fractures right posterior tenth and eleventh ribs.  No acute intra-abdominal or intrapelvic abnormalities.  Questionable sclerosis left eighth rib.  Mixed lytic and sclerotic process involving the T11 vertebral body, of uncertain age, not previously imaged ; metastatic disease not excluded, does patient have a history of malignancy?  Consider followup nonemergent radionuclide bone scan for further osseous assessment.  SIGNATURE  Electronically Signed   By: Lavonia Dana M.D.   On: 07/17/2013 14:47     EKG Interpretation   Date/Time:  Tuesday July 17 2013 10:59:00 EDT Ventricular Rate:  66 PR Interval:  138 QRS Duration: 137 QT Interval:  453 QTC Calculation: 475 R Axis:   101 Text Interpretation:  Sinus rhythm Nonspecific intraventricular conduction  delay Borderline repolarization abnormality Since last tracing eletronic V   pacing is not visible Confirmed by Eulis Foster  MD, Vira Agar ([id number]) on  07/17/2013 12:16:47 PM      MDM   Final diagnoses:  Fall  Multiple fractures of ribs of right side  Hypoxia    Fall cause not clear, with subsequent rib fractures. She has pain with inspiration and a low oxygen saturation is likely related to splinting. She does not have chest wall instability. There is no evidence for visceral injury. She will need to be managed for further evaluation and treatment  Nursing Notes Reviewed/ Care Coordinated, and agree without changes. Applicable Imaging Reviewed.  Interpretation of Laboratory Data incorporated into ED treatment  Pain: Admit  Richarda Blade,  MD 07/17/13 [phone]

## 2013-07-17 NOTE — H&P (Signed)
Date: 07/17/2013               Patient Name:  [name] [name] MRN: [id number]  DOB: 15-Jun-1924 Age / Sex: 80 y.o., female   PCP: Wayland Salinas, MD         Medical Service: Internal Medicine Teaching Service         Attending Physician: Dr. Axel Filler, MD    First Contact: Dr. Lum Babe, MD Pager: [phone]  Second Contact: Dr. Clinton Gallant, MD Pager: [phone]       After Hours (After 5p/  First Contact Pager: [phone]  weekends / holidays): Second Contact Pager: [phone]   Chief Complaint: Fall  History of Present Illness: [name] [name] is a 80 y.o. woman with a pmhx of dementia, and complex medication history detailed below who presents with a cc of fall this am. She is unable to provide detailed history regarding the fall due to her known dementia. The ALF reported that she appeared to fall while transferring from a chair while alone in her room. She was able to press the life alert button at that time to get help. The patients daughter states that her mental status has actually seemed improved since the fall.   Meds: No current facility-administered medications for this encounter.   Current Outpatient Prescriptions  Medication Sig Dispense Refill  . allopurinol (ZYLOPRIM) 100 MG tablet Take 100 mg by mouth daily.        . Ascorbic Acid (VITAMIN C) 500 MG tablet Take 500 mg by mouth daily.        [name] aspirin EC 81 MG tablet Take 1 tablet (81 mg total) by mouth daily.      . calcium carbonate (OS-CAL - DOSED IN MG OF ELEMENTAL CALCIUM) 1250 MG tablet Take 1 tablet by mouth 2 (two) times daily with a meal.      . carvedilol (COREG) 6.25 MG tablet Take 6.25 mg by mouth 2 (two) times daily with a meal.      . ergocalciferol (VITAMIN D2) 50000 UNITS capsule Take 50,000 Units by mouth once a week. On wednesdays      . fentaNYL (DURAGESIC - DOSED MCG/HR) 25 MCG/HR Place 1 patch onto the skin every 3 (three) days.        . furosemide (LASIX) 40 MG tablet Take 40  mg by mouth 2 (two) times daily.       . hydrALAZINE (APRESOLINE) 10 MG tablet Take 10 mg by mouth 3 (three) times daily. 3 times daily, breakfast, 2:00 pm and bedtime      . HYDROcodone-acetaminophen (NORCO/VICODIN) 5-325 MG per tablet Take 1 tablet by mouth every 8 (eight) hours as needed (chronic back pain).      . isosorbide mononitrate (IMDUR) 30 MG 24 hr tablet Take 30 mg by mouth daily.      [name] levothyroxine (SYNTHROID, LEVOTHROID) 88 MCG tablet Take 88 mcg by mouth daily.        [name] neomycin-bacitracin-polymyxin (NEOSPORIN) ointment Apply 1 application topically daily as needed for wound care. apply to eye      . pantoprazole (PROTONIX) 40 MG tablet Take 40 mg by mouth 2 (two) times daily before a meal.      . polyvinyl alcohol (LIQUIFILM TEARS) 1.4 % ophthalmic solution Place 1 drop into both eyes 3 (three) times daily.      . potassium chloride SA (K-DUR,KLOR-CON) 20 MEQ tablet Take 40 mEq by mouth 2 (two) times daily.       [name]  sertraline (ZOLOFT) 100 MG tablet Take 100 mg by mouth daily.        Dema Severin Petrolatum-Mineral Oil (ARTIFICIAL TEARS) OINT ophthalmic ointment Place 1 application into both eyes at bedtime.      . diphenhydramine-acetaminophen (TYLENOL PM) 25-500 MG TABS Take 2 tablets by mouth at bedtime as needed (for sleep).       . OxyCODONE HCl, Abuse Deter, 5 MG TABS Take 1 tablet by mouth every 8 (eight) hours as needed (pain).         Allergies: Allergies as of 07/17/2013  . (No Known Allergies)   Past Medical History  Diagnosis Date  . Atrial fibrillation   . BRONCHITIS, CHRONIC   . CHEST PAIN   . HYPOKALEMIA   . OBSTRUCTIVE SLEEP APNEA   . Palpitations   . SYSTOLIC HEART FAILURE, CHRONIC   . Depression   . CPAP (continuous positive airway pressure) dependence   . ACE-inhibitor cough   . Cardiac defibrillator in situ   . Chronic renal insufficiency   . GERD (gastroesophageal reflux disease)   . Hypothyroidism   . Hematuria   . Osteoarthritis (arthritis due  to wear and tear of joints)   . Colon polyps   . Chronic rhinitis   . RLS (restless legs syndrome)   . Chronic back pain   . Arthritis   . Glaucoma   . Sleep apnea    Past Surgical History  Procedure Laterality Date  . Back surgery    . Cardiac defibrillator placement  2005     [place] [phone]  . Skin cancer excision    . Cardiac catheterization  07/29/2003     Congestive heart failure, questionable etiology.  Increased LVEDP of 64mmHg on cath --  Nonobstructive coronary disease --         . Insert / replace / remove pacemaker  01/10/2004    Aborted biventricular implantable cardiac defibrillator implantation, complicated by probable pneumothorax  --  Deboraha Sprang, M.D.  . Atrial cardiac pacemaker insertion  01/15/2004    St. Jude 1581 65 cm dual coil active fixation defibrillator lead, serial number [id number] --  St. Jude Atlas +HF model V340CRT defibrillator, serial [id number]. -- Biventricular implantable cardioverter-defibrillator implantation, contrast venography and intraoperative defibrillation threshold testing -- SURGEON:  Deboraha Sprang, M.D.  . Cardiac pacemaker placement  03/17/2009    generator change  --  [place] ICD model [phone], serial number [id number]  -- Deboraha Sprang, MD, [facility]   . Femur fracture surgery    . Esophagogastroduodenoscopy  04/13/2011    Procedure: ESOPHAGOGASTRODUODENOSCOPY (EGD);  Surgeon: Zenovia Jarred, MD;  Location: [facility];  Service: Gastroenterology;  Laterality: N/A;  may need dilation   Family History  Problem Relation Age of Onset  . Heart disease Mother   . Colon cancer Mother   . Colon polyps Mother   . Emphysema Father   . Liver disease Brother   . Kidney disease Mother   . Kidney disease Maternal Grandmother    History   Social History  . Marital Status: Widowed    Spouse Name: N/A    Number of Children: N/A  . Years of Education: N/A   Occupational History  . Not on file.   Social History Main Topics  .  Smoking status: Former Smoker -- 1.00 packs/day    Types: Cigarettes    Quit date: 04/10/2003  . Smokeless tobacco: Never Used  . Alcohol Use: No  . Drug  Use: No  . Sexual Activity: Not on file   Other Topics Concern  . Not on file   Social History Narrative  . No narrative on file    Review of Systems: Review of Systems  Constitutional: Negative for fever, chills, malaise/fatigue and diaphoresis.  HENT: Negative for sore throat.   Eyes: Negative for blurred vision and double vision.  Respiratory: Negative for cough, hemoptysis and sputum production.   Cardiovascular: Positive for chest pain. Negative for palpitations, orthopnea and leg swelling.  Gastrointestinal: Negative for nausea, vomiting, abdominal pain, diarrhea and constipation.  Genitourinary: Negative for dysuria, urgency and frequency.  Neurological: Negative for dizziness, tingling, tremors, sensory change, speech change, weakness and headaches.     Physical Exam: Blood pressure 102/44, pulse 68, temperature 97 F (36.1 C), temperature source Oral, resp. rate 20, SpO2 97.00%. Physical Exam  Constitutional: She appears well-developed and well-nourished. No distress.  HENT:  Head: Normocephalic and atraumatic.  Eyes: EOM are normal. Pupils are equal, round, and reactive to light.  Cardiovascular: Normal rate, regular rhythm, normal heart sounds and intact distal pulses.  Exam reveals no gallop and no friction rub.   No murmur heard. Pulmonary/Chest: Effort normal and breath sounds normal. No respiratory distress. She has no wheezes. She has no rales. She exhibits tenderness.  Musculoskeletal:       Arms: Severe right sided chest pain with palpation and deep inspiration.  Skin: She is not diaphoretic.   Lab results: Basic Metabolic Panel:  Recent Labs  07/17/13 1145  NA 144  K 4.3  CL 103  CO2 27  GLUCOSE 105*  BUN 25*  CREATININE 1.47*  CALCIUM 10.1   CBC:  Recent Labs  07/17/13 1145  WBC 6.6   NEUTROABS 5.0  HGB 10.8*  HCT 33.0*  MCV 97.3  PLT 117*   Urinalysis:  Recent Labs  07/17/13 1100  COLORURINE YELLOW  LABSPEC 1.011  PHURINE 7.0  GLUCOSEU NEGATIVE  HGBUR NEGATIVE  BILIRUBINUR NEGATIVE  KETONESUR NEGATIVE  PROTEINUR NEGATIVE  UROBILINOGEN 1.0  NITRITE NEGATIVE  LEUKOCYTESUR NEGATIVE    Imaging results:  Dg Ribs Unilateral W/chest Right  07/17/2013   CLINICAL DATA:  Status post fall now with right-sided chest and rib discomfort  EXAM: RIGHT RIBS AND CHEST - 3+ VIEW  COMPARISON:  None.  FINDINGS: The lungs are mildly hypoinflated. No pneumothorax or significant pleural effusion is demonstrated. The cardiac silhouette is mildly enlarged. A permanent pacemaker-defibrillator is in place. The pulmonary vascularity is not engorged.  There is old deformity of the posterior lateral aspect of the right fifth rib. There are acute fractures of the right eighth through tenth ribs anterior laterally. There may be subtle fractures of the right sixth and seventh ribs as well. The right clavicle appears intact.  IMPRESSION: 1. The patient has sustained acute fractures of the right eighth through tenth ribs anterolaterally. The sixth and seventh ribs may be involved as well. There is an old fracture of the posterior lateral aspect of the right fifth rib. 2. There is no evidence of a pneumothorax nor significant pleural effusion. 3. There is stable enlargement of the cardiac silhouette with mild stable prominence of the pulmonary interstitial markings. These lung markings are accentuated by mild hypoinflation.   Electronically Signed   By: David  Martinique   On: 07/17/2013 12:20   Ct Chest W Contrast  07/17/2013   CLINICAL DATA Right flank pain post fall this morning, unwitnessed, history systolic heart failure, atrial fibrillation, smoking  EXAM  CT CHEST, ABDOMEN, AND PELVIS WITH CONTRAST  TECHNIQUE Multidetector CT imaging of the chest, abdomen and pelvis was performed following the  standard protocol during bolus administration of intravenous contrast. Sagittal and coronal MPR images reconstructed from axial data set.  CONTRAST 49mL OMNIPAQUE IOHEXOL 300 MG/ML SOLN. No oral contrast administered.  COMPARISON None  FINDINGS CT CHEST FINDINGS  Left subclavian AICD leads located at right atrium, right ventricle and coronary sinus.  Scattered atherosclerotic calcifications aorta, great vessels, and coronary arteries.  No thoracic adenopathy.  Moderate-sized hiatal hernia.  Bibasilar atelectasis.  Peripheral scattered interstitial changes in the upper lobes early question fibrosis.  No segmental infiltrate, pleural effusion, or pneumothorax.  Minimally displaced fractures of the posterior right tenth and eleventh ribs.  Questionable sclerosis of the left posterior eighth rib  CT ABDOMEN AND PELVIS FINDINGS  6 mm cyst right lobe liver superiorly image 36.  Additional tiny nonspecific low-attenuation foci in liver 5 mm diameter or less.  Tiny nonobstructing calculus at lower pole right kidney image 66.  Liver, spleen, pancreas, kidneys, and adrenal glands normal appearance.  Scattered atherosclerotic calcification.  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  Minimal free pelvic fluid, low attenuation.  Nonspecific small subcutaneous hematoma right flank.  No mass, adenopathy, hernia, or free air.  Osseous demineralization with orthopedic hardware proximal right femur.  Compression deformities at superior endplate of L4 and inferior endplate of L1.  Mixed lytic and sclerotic process of T11 vertebral body.  IMPRESSION Moderate-sized hiatal hernia.  Bibasilar atelectasis and question minimal peripheral pulmonary fibrosis.  Fractures right posterior tenth and eleventh ribs.  No acute intra-abdominal or intrapelvic abnormalities.  Questionable sclerosis left eighth rib.  Mixed lytic and sclerotic process involving the T11 vertebral body, of uncertain age, not previously imaged ; metastatic  disease not excluded, does patient have a history of malignancy?  Consider followup nonemergent radionuclide bone scan for further osseous assessment.  SIGNATURE  Electronically Signed   By: Lavonia Dana M.D.   On: 07/17/2013 14:47   Ct Abdomen Pelvis W Contrast  07/17/2013   CLINICAL DATA Right flank pain post fall this morning, unwitnessed, history systolic heart failure, atrial fibrillation, smoking  EXAM CT CHEST, ABDOMEN, AND PELVIS WITH CONTRAST  TECHNIQUE Multidetector CT imaging of the chest, abdomen and pelvis was performed following the standard protocol during bolus administration of intravenous contrast. Sagittal and coronal MPR images reconstructed from axial data set.  CONTRAST 29mL OMNIPAQUE IOHEXOL 300 MG/ML SOLN. No oral contrast administered.  COMPARISON None  FINDINGS CT CHEST FINDINGS  Left subclavian AICD leads located at right atrium, right ventricle and coronary sinus.  Scattered atherosclerotic calcifications aorta, great vessels, and coronary arteries.  No thoracic adenopathy.  Moderate-sized hiatal hernia.  Bibasilar atelectasis.  Peripheral scattered interstitial changes in the upper lobes early question fibrosis.  No segmental infiltrate, pleural effusion, or pneumothorax.  Minimally displaced fractures of the posterior right tenth and eleventh ribs.  Questionable sclerosis of the left posterior eighth rib  CT ABDOMEN AND PELVIS FINDINGS  6 mm cyst right lobe liver superiorly image 36.  Additional tiny nonspecific low-attenuation foci in liver 5 mm diameter or less.  Tiny nonobstructing calculus at lower pole right kidney image 66.  Liver, spleen, pancreas, kidneys, and adrenal glands normal appearance.  Scattered atherosclerotic calcification.  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  Minimal free pelvic fluid, low attenuation.  Nonspecific small subcutaneous hematoma right flank.  No mass, adenopathy, hernia, or free air.  Osseous demineralization with  orthopedic  hardware proximal right femur.  Compression deformities at superior endplate of L4 and inferior endplate of L1.  Mixed lytic and sclerotic process of T11 vertebral body.  IMPRESSION Moderate-sized hiatal hernia.  Bibasilar atelectasis and question minimal peripheral pulmonary fibrosis.  Fractures right posterior tenth and eleventh ribs.  No acute intra-abdominal or intrapelvic abnormalities.  Questionable sclerosis left eighth rib.  Mixed lytic and sclerotic process involving the T11 vertebral body, of uncertain age, not previously imaged ; metastatic disease not excluded, does patient have a history of malignancy?  Consider followup nonemergent radionuclide bone scan for further osseous assessment.  SIGNATURE  Electronically Signed   By: Lavonia Dana M.D.   On: 07/17/2013 14:47    Other results: EKG: unchanged from previous tracings, non-specific IVCD, borderline repol abnl..  Assessment & Plan by Problem: Principal Problem:   Broken ribs Active Problems:   OBSTRUCTIVE SLEEP APNEA   Biventricular implantable cardioverter-defibrillator LV lead off   Chronic systolic congestive heart failure  Rib Fracture 2/2 to likely mechanical fall The patient has a number of non-displaced rib fractures necessitating increase pain therapy. The fall was likely mechanical but other etiologies are possible. Given her baseline narcotics (fenanyl patch 25 mcg/hr and oxycodone, and hydrocodone) and her advanced age with known dementia, pain control will be made difficult. A balance will be sought between adequate pain control and minimized AMS. We aimto stay on the side of minimizing pain meds. - Continue home fentanyl patch 25 mcg/hr - Oxycodone 10 IR q 6 prn - If the above fails to provide adequate pain morphine 1 mg q 4 prn (hold parameters AMS,  RR <14, HR< 60, SBP < 100). 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  There is a question of malignancy. Radiology recommend outpatient f/u with radionucleotide bone scan.  Chronic systolic CHF The patient appear euvolemic. This appears stable. - Continue home meds (coreg 6.25 qd, hydral 10 mg TID, imdur 30 mg qd, lasix 40 mg BID) - Tele  Anemia Appears stable. Borderline macrocytosis. - F/U b12 folate iron TIBC, ferritin  GERD Appears stable, continue home PPI.  Mood D/O Continue home zoloft  Hypothyroid Continue home levothyroxine.  OSA Not on home CPAP.  Diet: Heart Diet DVT: Lovenox  Dispo: Disposition is deferred at this time, awaiting improvement of current medical problems. Anticipated discharge in approximately 1 day(s).   The patient does have a current PCP Wayland Salinas, MD) and does not need an [facility] hospital follow-up appointment after discharge.  The patient does have transportation limitations that hinder transportation to clinic appointments.  Signed: Marrion Coy, MD 07/17/2013, 5:36 PM

## 2013-07-17 NOTE — ED Notes (Signed)
Pt to XR

## 2013-07-17 NOTE — Progress Notes (Signed)
Report received 

## 2013-07-18 DIAGNOSIS — W19XXXA Unspecified fall, initial encounter: Secondary | ICD-10-CM

## 2013-07-18 DIAGNOSIS — S2239XA Fracture of one rib, unspecified side, initial encounter for closed fracture: Secondary | ICD-10-CM

## 2013-07-18 LAB — CBC
HEMATOCRIT: 34.7 % — AB (ref 36.0–46.0)
Hemoglobin: 11 g/dL — ABNORMAL LOW (ref 12.0–15.0)
MCH: 31.5 pg (ref 26.0–34.0)
MCHC: 31.7 g/dL (ref 30.0–36.0)
MCV: 99.4 fL (ref 78.0–100.0)
Platelets: 118 10*3/uL — ABNORMAL LOW (ref 150–400)
RBC: 3.49 MIL/uL — AB (ref 3.87–5.11)
RDW: 16.2 % — ABNORMAL HIGH (ref 11.5–15.5)
WBC: 6.8 10*3/uL (ref 4.0–10.5)

## 2013-07-18 LAB — URINE CULTURE

## 2013-07-18 LAB — BASIC METABOLIC PANEL
BUN: 23 mg/dL (ref 6–23)
CHLORIDE: 104 meq/L (ref 96–112)
CO2: 27 meq/L (ref 19–32)
CREATININE: 1.36 mg/dL — AB (ref 0.50–1.10)
Calcium: 10.3 mg/dL (ref 8.4–10.5)
GFR calc Af Amer: 40 mL/min — ABNORMAL LOW (ref >90)
GFR calc non Af Amer: 34 mL/min — ABNORMAL LOW (ref >90)
Glucose, Bld: 104 mg/dL — ABNORMAL HIGH (ref 70–99)
Potassium: 3.7 mEq/L (ref 3.7–5.3)
Sodium: 145 mEq/L (ref 137–147)

## 2013-07-18 LAB — FOLATE: FOLATE: 10.5 ng/mL

## 2013-07-18 LAB — FERRITIN: Ferritin: 431 ng/mL — ABNORMAL HIGH (ref 10–291)

## 2013-07-18 LAB — VITAMIN B12: VITAMIN B 12: 317 pg/mL (ref 211–911)

## 2013-07-18 NOTE — Progress Notes (Signed)
Internal Medicine Attending  Date: 07/18/2013  Patient name: [name] [name] [facility] Medical record number: [id number] Date of birth: 1924-09-08 Age: 80 y.o. Gender: female  I saw and evaluated the patient on A.M rounds, and discussed her care with housestaff.  I reviewed the resident's note by Dr. Margart Sickles and I agree with the resident's findings and plans as documented in his note, with the following additional comments.  Patient's pain seems better controlled at rest; it is aggravated by movement.  Physical therapy feels patient needs a skilled level of care, and hopefully this can be arranged in her current setting while her rib fractures heal.

## 2013-07-18 NOTE — Discharge Instructions (Signed)
Rib Fracture  A rib fracture is a break or crack in one of the bones of the ribs. The ribs are a group of long, curved bones that wrap around your chest and attach to your spine. They protect your lungs and other organs in the chest cavity. A broken or cracked rib is often painful, but most do not cause other problems. Most rib fractures heal on their own over time. However, rib fractures can be more serious if multiple ribs are broken or if broken ribs move out of place and push against other structures.  CAUSES   · A direct blow to the chest. For example, this could happen during contact sports, a car accident, or a fall against a hard object.  · Repetitive movements with high force, such as pitching a baseball or having severe coughing spells.  SYMPTOMS   · Pain when you breathe in or cough.  · Pain when someone presses on the injured area.  DIAGNOSIS   Your caregiver will perform a physical exam. Various imaging tests may be ordered to confirm the diagnosis and to look for related injuries. These tests may include a chest X-ray, computed tomography (CT), magnetic resonance imaging (MRI), or a bone scan.  TREATMENT   Rib fractures usually heal on their own in 1 3 months. The longer healing period is often associated with a continued cough or other aggravating activities. During the healing period, pain control is very important. Medication is usually given to control pain. Hospitalization or surgery may be needed for more severe injuries, such as those in which multiple ribs are broken or the ribs have moved out of place.   HOME CARE INSTRUCTIONS   · Avoid strenuous activity and any activities or movements that cause pain. Be careful during activities and avoid bumping the injured rib.  · Gradually increase activity as directed by your caregiver.  · Only take over-the-counter or prescription medications as directed by your caregiver. Do not take other medications without asking your caregiver first.  · Apply ice  to the injured area for the first 1 2 days after you have been treated or as directed by your caregiver. Applying ice helps to reduce inflammation and pain.  · Put ice in a plastic bag.  · Place a towel between your skin and the bag.    · Leave the ice on for 15 20 minutes at a time, every 2 hours while you are awake.  · Perform deep breathing as directed by your caregiver. This will help prevent pneumonia, which is a common complication of a broken rib. Your caregiver may instruct you to:  · Take deep breaths several times a day.  · Try to cough several times a day, holding a pillow against the injured area.  · Use a device called an incentive spirometer to practice deep breathing several times a day.  · Drink enough fluids to keep your urine clear or pale yellow. This will help you avoid constipation.    · Do not wear a rib belt or binder. These restrict breathing, which can lead to pneumonia.    SEEK IMMEDIATE MEDICAL CARE IF:   · You have a fever.    · You have difficulty breathing or shortness of breath.    · You develop a continual cough, or you cough up thick or bloody sputum.  · You feel sick to your stomach (nausea), throw up (vomit), or have abdominal pain.    · You have worsening pain not controlled with medications.      MAKE SURE YOU:  · Understand these instructions.  · Will watch your condition.  · Will get help right away if you are not doing well or get worse.  Document Released: 04/26/2005 Document Revised: 12/27/2012 Document Reviewed: 06/28/2012  [employer]® Patient Information ©2014 [employer].

## 2013-07-18 NOTE — Evaluation (Addendum)
Physical Therapy Evaluation Patient Details Name: TAIANA TEMKIN MRN: [id number] DOB: Jan 04, 1925 Today's Date: 07/18/2013 Time: 0937-1020 PT Time Calculation (min): 43 min  PT Assessment / Plan / Recommendation History of Present Illness  Patient is an 80 year old woman, resident of an ALF, who reportedly fell while transferring from her bed to a wheel chair.  Patient denies syncope, and has no current complaints other than the rib pain.    Clinical Impression  Patient demonstrates deficits in mobility as indicated patient will need continued PT services, recommend continued therapy upon discharge.      PT Assessment  Patient needs continued PT services    Follow Up Recommendations  SNF (given baseline dementia and age, would be in best interest for patient to return to her familiar setting with increased level of assist and trial of PT services. Highly recommend return to ALF if increased level of care (skilled) can be provided patient is requiring max assist at this time)          Equipment Recommendations  None recommended by PT       Frequency Min 2X/week    Precautions / Restrictions Precautions Precautions: Fall   Pertinent Vitals/Pain Extreme pain with all mobility (no value given)      Mobility  Bed Mobility Overal bed mobility: Needs Assistance;+2 for physical assistance Bed Mobility: Supine to Sit;Sit to Supine;Rolling Rolling: +2 for physical assistance;Max assist Supine to sit: +2 for physical assistance;Max assist Sit to supine: +2 for physical assistance;Max assist General bed mobility comments: Patient initiates movement but very limited by pain Transfers Overall transfer level: Needs assistance Equipment used: 2 person hand held assist Transfers: Sit to/from Stand;Stand Pivot Transfers Sit to Stand: +2 physical assistance;Mod assist Stand pivot transfers: +2 physical assistance;Max assist General transfer comment: Patient with significant pain  limiting ability to mobilie, patient did intiate steps to pivot to chair but required increased assist to complete transfer    Exercises     PT Diagnosis: Difficulty walking;Abnormality of gait;Generalized weakness;Acute pain;Altered mental status  PT Problem List: Decreased strength;Decreased range of motion;Decreased activity tolerance;Decreased balance;Decreased mobility;Pain PT Treatment Interventions: DME instruction;Functional mobility training;Therapeutic activities;Therapeutic exercise;Balance training;Patient/family education;Gait training     PT Goals(Current goals can be found in the care plan section) Acute Rehab PT Goals Patient Stated Goal: to go home PT Goal Formulation: With patient/family Time For Goal Achievement: 08/01/13 Potential to Achieve Goals: Fair  Visit Information  Last PT Received On: 07/18/13 Assistance Needed: +2 History of Present Illness: Patient is an 80 year old woman, resident of an ALF, who reportedly fell while transferring from her bed to a wheel chair.  Patient denies syncope, and has no current complaints other than the rib pain.         Prior [place] expects to be discharged to:: Skilled nursing facility Prior Function Level of Independence: Needs assistance Gait / Transfers Assistance Needed: assist for transfers to wheel chair Communication Communication: HOH Dominant Hand: Right    Cognition  Cognition Arousal/Alertness: Awake/alert Behavior During Therapy: Anxious Overall Cognitive Status: History of cognitive impairments - at baseline    Extremity/Trunk Assessment Upper Extremity Assessment Upper Extremity Assessment: Defer to OT evaluation (difficult to assess secondary to pain) Lower Extremity Assessment Lower Extremity Assessment: Generalized weakness Cervical / Trunk Assessment Cervical / Trunk Assessment: Other exceptions Cervical / Trunk Exceptions: rib fractures   Balance  Balance Overall balance assessment: Needs assistance Sitting-balance support: Feet supported Sitting balance-Leahy Scale: Poor Sitting  balance - Comments: assist for posterior lean occassionally with dynamic movement  (unable to self support secondary to increased pain in ribs) Postural control: Posterior lean Standing balance support: During functional activity;Bilateral upper extremity supported Standing balance-Leahy Scale: Poor Standing balance comment: supoorted by +2 bilateral support, able to maintain standing through LEs for several minutes for pericare and hygiene despite pain (with assist) General Comments General comments (skin integrity, edema, etc.): patient saturated in urine upon start of session, hygiene performed in bed, patient assisted to EOB strength assessed, upon standing patient unable to control bladder and voided in standing position.  Assisted again with pericare and hygiene. LEs cleansed, bathed and dressed and positioned for comfort in the chair.  Spoke to daughter extensively regarding recommendations upon discharge given baseline cognition deficits.   End of Session PT - End of Session Equipment Utilized During Treatment: Gait belt Activity Tolerance: Patient limited by pain Patient left: in chair;with call bell/phone within reach;with chair alarm set;with family/visitor present Nurse Communication: Mobility status;Precautions  GP     08/04/13 1100  PT G-Codes **NOT FOR INPATIENT CLASS**  Functional Assessment Tool Used clinical judgement  Functional Limitation Mobility: Walking and moving around  Mobility: Walking and Moving Around Current Status (K7425) CL  Mobility: Walking and Moving Around Goal Status (Z5638) Ernst Breach August 04, 2013, 11:28 AM Alben Deeds, PT DPT  [phone]

## 2013-07-18 NOTE — Progress Notes (Signed)
UR completed 

## 2013-07-18 NOTE — Progress Notes (Signed)
Patient refused to eat, take medication because she wants to go back home, writer called daughter to see if she will explain to patient that we are here to help her. Daughter spoke to patient and it helped alleviate her fears and anxiety.Will continue to monitor.

## 2013-07-18 NOTE — Progress Notes (Addendum)
Tried to d/c patient with the interpreter company but patient seem not to understand what the writer was trying to explain. Patient wants writer to state in the d/c instruction what was the cause of her itching. She wanted this Probation officer to call the MD so she can tell her what caused her to allergic reaction. Writer paged MD and she said she already explained it to the patient, but she will come back and explain that to her again.Will let patient know.  The above appears to have been documented on the wrong patient.  Lum Babe, MD

## 2013-07-18 NOTE — Progress Notes (Signed)
Subjective:  Patient refused medication and to eat. She was confused. She did not recognize where she was. She wants to be allowed to go home. Daughter at patient bedside. Daughter was able to reassure the patient and explain why she is here.  Objective: Vital signs in last 24 hours: Filed Vitals:   07/17/13 1950 07/17/13 2000 07/17/13 2106 07/18/13 0519  BP: 122/53 144/70 128/61 144/67  Pulse: 66 72 68 74  Temp:   98.1 F (36.7 C) 97.3 F (36.3 C)  TempSrc:   Oral Oral  Resp: 18 20 18 20   Weight:   150 lb 2.1 oz (68.1 kg)   SpO2: 97% 98% 90% 90%   Weight change:  No intake or output data in the 24 hours ending 07/18/13 1026 Physical Exam  Cardiovascular: Normal rate, regular rhythm, normal heart sounds and intact distal pulses.  Exam reveals no friction rub.   No murmur heard. Pulmonary/Chest: Effort normal and breath sounds normal. No respiratory distress. She has no wheezes. She has no rales. She exhibits tenderness.  Neurological: She is alert.  Not oriented    Lab Results: Basic Metabolic Panel:  Recent Labs Lab 07/17/13 1145 07/18/13 0640  NA 144 145  K 4.3 3.7  CL 103 104  CO2 27 27  GLUCOSE 105* 104*  BUN 25* 23  CREATININE 1.47* 1.36*  CALCIUM 10.1 10.3   CBC:  Recent Labs Lab 07/17/13 1145 07/18/13 0640  WBC 6.6 6.8  NEUTROABS 5.0  --   HGB 10.8* 11.0*  HCT 33.0* 34.7*  MCV 97.3 99.4  PLT 117* 118*   Cardiac Enzymes:  Recent Labs Lab 07/17/13 1853  TROPONINI <0.30   Anemia Panel:  Recent Labs Lab 07/17/13 1853  VITAMINB12 317  FOLATE 10.5  FERRITIN 431*  TIBC 266  IRON 45  RETICCTPCT 3.0   Urinalysis:  Recent Labs Lab 07/17/13 1100  COLORURINE YELLOW  LABSPEC 1.011  PHURINE 7.0  GLUCOSEU NEGATIVE  HGBUR NEGATIVE  BILIRUBINUR NEGATIVE  KETONESUR NEGATIVE  PROTEINUR NEGATIVE  UROBILINOGEN 1.0  NITRITE NEGATIVE  LEUKOCYTESUR NEGATIVE   Studies/Results: Dg Ribs Unilateral W/chest Right  07/17/2013   CLINICAL  DATA:  Status post fall now with right-sided chest and rib discomfort  EXAM: RIGHT RIBS AND CHEST - 3+ VIEW  COMPARISON:  None.  FINDINGS: The lungs are mildly hypoinflated. No pneumothorax or significant pleural effusion is demonstrated. The cardiac silhouette is mildly enlarged. A permanent pacemaker-defibrillator is in place. The pulmonary vascularity is not engorged.  There is old deformity of the posterior lateral aspect of the right fifth rib. There are acute fractures of the right eighth through tenth ribs anterior laterally. There may be subtle fractures of the right sixth and seventh ribs as well. The right clavicle appears intact.  IMPRESSION: 1. The patient has sustained acute fractures of the right eighth through tenth ribs anterolaterally. The sixth and seventh ribs may be involved as well. There is an old fracture of the posterior lateral aspect of the right fifth rib. 2. There is no evidence of a pneumothorax nor significant pleural effusion. 3. There is stable enlargement of the cardiac silhouette with mild stable prominence of the pulmonary interstitial markings. These lung markings are accentuated by mild hypoinflation.   Electronically Signed   By: David  Martinique   On: 07/17/2013 12:20   Ct Chest W Contrast  07/17/2013   CLINICAL DATA Right flank pain post fall this morning, unwitnessed, history systolic heart failure, atrial fibrillation, smoking  EXAM  CT CHEST, ABDOMEN, AND PELVIS WITH CONTRAST  TECHNIQUE Multidetector CT imaging of the chest, abdomen and pelvis was performed following the standard protocol during bolus administration of intravenous contrast. Sagittal and coronal MPR images reconstructed from axial data set.  CONTRAST 92mL OMNIPAQUE IOHEXOL 300 MG/ML SOLN. No oral contrast administered.  COMPARISON None  FINDINGS CT CHEST FINDINGS  Left subclavian AICD leads located at right atrium, right ventricle and coronary sinus.  Scattered atherosclerotic calcifications aorta, great  vessels, and coronary arteries.  No thoracic adenopathy.  Moderate-sized hiatal hernia.  Bibasilar atelectasis.  Peripheral scattered interstitial changes in the upper lobes early question fibrosis.  No segmental infiltrate, pleural effusion, or pneumothorax.  Minimally displaced fractures of the posterior right tenth and eleventh ribs.  Questionable sclerosis of the left posterior eighth rib  CT ABDOMEN AND PELVIS FINDINGS  6 mm cyst right lobe liver superiorly image 36.  Additional tiny nonspecific low-attenuation foci in liver 5 mm diameter or less.  Tiny nonobstructing calculus at lower pole right kidney image 66.  Liver, spleen, pancreas, kidneys, and adrenal glands normal appearance.  Scattered atherosclerotic calcification.  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  Minimal free pelvic fluid, low attenuation.  Nonspecific small subcutaneous hematoma right flank.  No mass, adenopathy, hernia, or free air.  Osseous demineralization with orthopedic hardware proximal right femur.  Compression deformities at superior endplate of L4 and inferior endplate of L1.  Mixed lytic and sclerotic process of T11 vertebral body.  IMPRESSION Moderate-sized hiatal hernia.  Bibasilar atelectasis and question minimal peripheral pulmonary fibrosis.  Fractures right posterior tenth and eleventh ribs.  No acute intra-abdominal or intrapelvic abnormalities.  Questionable sclerosis left eighth rib.  Mixed lytic and sclerotic process involving the T11 vertebral body, of uncertain age, not previously imaged ; metastatic disease not excluded, does patient have a history of malignancy?  Consider followup nonemergent radionuclide bone scan for further osseous assessment.  SIGNATURE  Electronically Signed   By: Lavonia Dana M.D.   On: 07/17/2013 14:47   Ct Abdomen Pelvis W Contrast  07/17/2013   CLINICAL DATA Right flank pain post fall this morning, unwitnessed, history systolic heart failure, atrial fibrillation, smoking   EXAM CT CHEST, ABDOMEN, AND PELVIS WITH CONTRAST  TECHNIQUE Multidetector CT imaging of the chest, abdomen and pelvis was performed following the standard protocol during bolus administration of intravenous contrast. Sagittal and coronal MPR images reconstructed from axial data set.  CONTRAST 43mL OMNIPAQUE IOHEXOL 300 MG/ML SOLN. No oral contrast administered.  COMPARISON None  FINDINGS CT CHEST FINDINGS  Left subclavian AICD leads located at right atrium, right ventricle and coronary sinus.  Scattered atherosclerotic calcifications aorta, great vessels, and coronary arteries.  No thoracic adenopathy.  Moderate-sized hiatal hernia.  Bibasilar atelectasis.  Peripheral scattered interstitial changes in the upper lobes early question fibrosis.  No segmental infiltrate, pleural effusion, or pneumothorax.  Minimally displaced fractures of the posterior right tenth and eleventh ribs.  Questionable sclerosis of the left posterior eighth rib  CT ABDOMEN AND PELVIS FINDINGS  6 mm cyst right lobe liver superiorly image 36.  Additional tiny nonspecific low-attenuation foci in liver 5 mm diameter or less.  Tiny nonobstructing calculus at lower pole right kidney image 66.  Liver, spleen, pancreas, kidneys, and adrenal glands normal appearance.  Scattered atherosclerotic calcification.  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  Minimal free pelvic fluid, low attenuation.  Nonspecific small subcutaneous hematoma right flank.  No mass, adenopathy, hernia, or free air.  Osseous demineralization with  orthopedic hardware proximal right femur.  Compression deformities at superior endplate of L4 and inferior endplate of L1.  Mixed lytic and sclerotic process of T11 vertebral body.  IMPRESSION Moderate-sized hiatal hernia.  Bibasilar atelectasis and question minimal peripheral pulmonary fibrosis.  Fractures right posterior tenth and eleventh ribs.  No acute intra-abdominal or intrapelvic abnormalities.  Questionable  sclerosis left eighth rib.  Mixed lytic and sclerotic process involving the T11 vertebral body, of uncertain age, not previously imaged ; metastatic disease not excluded, does patient have a history of malignancy?  Consider followup nonemergent radionuclide bone scan for further osseous assessment.  SIGNATURE  Electronically Signed   By: Lavonia Dana M.D.   On: 07/17/2013 14:47   Medications: I have reviewed the patient's current medications. Scheduled Meds: . allopurinol  100 mg Oral Daily  . aspirin EC  81 mg Oral Daily  . calcium carbonate  1 tablet Oral BID WC  . carvedilol  6.25 mg Oral BID WC  . enoxaparin (LOVENOX) injection  30 mg Subcutaneous Q24H  . [START ON 07/19/2013] fentaNYL  25 mcg Transdermal Q72H  . furosemide  40 mg Oral BID  . hydrALAZINE  10 mg Oral 3 times per day  . isosorbide mononitrate  30 mg Oral Daily  . levothyroxine  88 mcg Oral QAC breakfast  . pantoprazole  40 mg Oral BID AC  . polyvinyl alcohol  1 drop Both Eyes TID  . senna-docusate  1 tablet Oral QHS  . sertraline  100 mg Oral Daily  . sodium chloride  3 mL Intravenous Q12H   Continuous Infusions:  PRN Meds:.sodium chloride, morphine injection, ondansetron (ZOFRAN) IV, ondansetron, oxyCODONE, sodium chloride Assessment/Plan: Principal Problem:   Broken ribs Active Problems:   OBSTRUCTIVE SLEEP APNEA   Biventricular implantable cardioverter-defibrillator LV lead off   Chronic systolic congestive heart failure  AMS The patients AMS appears to have resolved with reassurance from the daughter. It is likely due to dementia and change in environment. Drug side effects are certainly possible was well.  - Plan for discharge back to ALF as soon as possible  Rib Fracture Stable rib fractures. Patient continues to have significant chest wall pain as to be expected.  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 There is a question of malignancy. Radiology recommend outpatient f/u with radionucleotide bone scan.   Chronic systolic CHF  The patient appear euvolemic. This appears stable.  - Continue home meds (coreg 6.25 qd, hydral 10 mg TID, imdur 30 mg qd, lasix 40 mg BID)  - Tele   Anemia  Appears stable. Borderline macrocytosis.  - F/U b12 (317, low normal), folate (nl) - start monthly b12 as outpatient  GERD  Appears stable, continue home PPI.   Mood D/O  Continue home zoloft   Hypothyroid  Continue home levothyroxine.   OSA  Not on home CPAP.   Diet: Heart Diet  DVT: Lovenox    Dispo: Disposition is deferred at this time, awaiting improvement of current medical problems.  Anticipated discharge in approximately 1 day(s).   The patient does have a current PCP Wayland Salinas, MD) and does not need an [facility] hospital follow-up appointment after discharge.  The patient does have transportation limitations that hinder transportation to clinic appointments.  .Services Needed at time of discharge: Y = Yes, Blank =  No PT:   OT:   RN:   Equipment:   Other:     LOS: 1 day   Marrion Coy, MD 07/18/2013, 10:26 AM

## 2013-07-19 MED ORDER — HYDROCODONE-ACETAMINOPHEN 5-325 MG PO TABS: 1.0000 | ORAL_TABLET | Freq: Three times a day (TID) | ORAL | Status: DC | PRN

## 2013-07-19 MED ORDER — WHITE PETROLATUM GEL
Status: AC
  Filled 2013-07-19: qty 5

## 2013-07-19 MED ORDER — OXYCODONE HCL 5 MG PO TABS
5.0000 mg | ORAL_TABLET | ORAL | Status: DC | PRN
  Administered 2013-07-19: 5 mg via ORAL
  Filled 2013-07-19: qty 1

## 2013-07-19 MED ORDER — OXYCODONE HCL 5 MG PO TABS: ORAL_TABLET | ORAL | Status: DC

## 2013-07-19 NOTE — Progress Notes (Signed)
Night Float Interim Progress Note  Called by RN in regards to patient agitation overnight.  Went to evaluate patient, she was lying quietly in bed at that time.  Upon talking with Ms. Besson, she became intermittently tearful, asking to leave but saying "we wont help her".  When asked if she has any pain she says no and that she does not want any medicine. She is oriented to self and acknowledged having a daughter but did give name.  When asked if there is anything else we could do for her, she said no and that she wishes to go home. Per RN, she was apparently agitated at times last night as well but was noted to be more calm after speaking with her daughter on the phone.   Patient refused physical exam  Ms. Jenne is a 80 year old female with dementia and sCHF admitted s/p mechanical fall with rib fracture.  Noted to have increased confusion and intermittent agitation overnight.   -patient refusing any medication at this time and does not wish to speak to any family -will continue to monitor and try to redirect, may need to call family again as that seems to calm her down best  Signed: Jerene Pitch, MD PGY-2, Internal Medicine Resident Pager: [phone]  07/19/2013,3:13 AM

## 2013-07-19 NOTE — Progress Notes (Signed)
Internal Medicine Attending  Date: 07/19/2013  Patient name: [name] [name] [facility] Medical record number: [id number] Date of birth: January 11, 1925 Age: 80 y.o. Gender: female  I saw and evaluated the patient, and discussed her care on A.M rounds with housestaff.  I reviewed the resident's note by Dr. Margart Sickles and I agree with the resident's findings and plans as documented in his note.

## 2013-07-19 NOTE — Clinical Social Work Psychosocial (Signed)
Clinical Social Work Department BRIEF PSYCHOSOCIAL ASSESSMENT 07/19/2013  Patient:  [name] [name],[name] [name]     Account Number:  [id number]     Admit date:  07/17/2013  Clinical Social Worker:  Donna Christen  Date/Time:  07/19/2013 10:39 AM  Referred by:  Physician  Date Referred:  07/19/2013 Referred for  SNF Placement   Other Referral:   none.   Interview type:  Family Other interview type:   CSW spoke with pt'[name] daughter, Nevin Bloodgood.    PSYCHOSOCIAL DATA Living Status:  FACILITY Admitted from facility:  [facility] Level of care:  Assisted Living Primary support name:  Asencion Partridge Primary support relationship to patient:  CHILD, ADULT Degree of support available:   Strong support system.    CURRENT CONCERNS Current Concerns  Post-Acute Placement   Other Concerns:   none.    SOCIAL WORK ASSESSMENT / PLAN CSW spoke with pt'[name] daughter regarding pt'[name] discharge disposition. Per pt'[name] daughter, pt is a resident at [facility]. Pt'[name] daughter stated that she would prefer for pt to return to [facility] as it is a familiar setting for pt, but has concerns about the facility being able to accomodate pt'[name] needs. CSW informed pt'[name] daughter that CSW will be faxing pt'[name] clinical information to [facility] for them to review and determine if they are able to accomodate pt'[name] needs. Pt'[name] daughter stated that she was agreeable and will await CSW to call her with [facility]'[name] decision. CSW to continue to follow and assit with discharge planning needs.   Assessment/plan status:  Psychosocial Support/Ongoing Assessment of Needs Other assessment/ plan:   none.   Information/referral to community resources:   Pt to return to [facility] ALF.    PATIENTS/FAMILYS RESPONSE TO PLAN OF CARE: Pt'[name] daughter hesitant about pt being able to receive adequate care at [facility], but open to seeing the services that they will provide.       Pati Gallo, [place] Social Worker [phone]

## 2013-07-19 NOTE — Clinical Social Work Note (Signed)
CSW received consult for SNF v. ALF at time of discharge. CSW has called and left message with pt's daughter, Nevin Bloodgood. CSW awaiting call back to discuss discharge disposition.  Pati Gallo, [place] Social Worker [phone]
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
## 2013-07-19 NOTE — Progress Notes (Signed)
Received call from [facility] with [facility], they cannot accept the patient's insurance; Mary with [facility] called - they will accept her insurance; patient daughter called and updated, also the [place] [facility] called for change in [facility] agency; Mindi Slicker RN,BSN,MHA [phone]

## 2013-07-19 NOTE — Clinical Social Work Note (Signed)
CSW has faxed discharge summary and updated FL2 with medications to facility. CSW has completed discharge packet and placed on pt's shadow chart. CSW has completed necessary form for ambulance transportation. CSW has spoken with pt's daughter regarding discharge. Pt's daughter agreeable to discharge to [facility].   CSW has consulted with RN regarding information above. RN to call for ambulance Corey Harold) once MD has signed updated FL2 with medications.   RN to call for ambulance ([facility]) at [phone]  RN to call report at [facility] [place], [place] Worker [phone]
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
## 2013-07-19 NOTE — Evaluation (Signed)
Occupational Therapy Evaluation Patient Details Name: [name] [name] MRN: [id number] DOB: [birth year] Today's Date: 07/19/2013 Time: 9371-6967 OT Time Calculation (min): 63 min  OT Assessment / Plan / Recommendation History of present illness Patient is an 80 year old woman, resident of an ALF, who reportedly fell while transferring from her bed to a wheel chair.  Patient denies syncope, and has no current complaints other than the rib pain.     Clinical Impression   Pt admitted with above. She demonstrates the below listed deficits and will benefit from continued OT to maximize safety and independence with BADLs.  Pt very participatory and appropriate with OT.  She is limited by rib pain, and moved better if she splinted Rt. Ribs with pillow.   She was able to move to EOB where she performed simple grooming activities.  Overall, she is requiring max - total A with BADLs with exception of self feeding and grooming which are min A.  She was unable to stand or transfer to chair due to pain.  Dtr present at end of eval.  She is hoping that pt can discharge to [facility] and receive HHOT and PT there as this will be less stressful on pt than transferring to SNF for therapies then back to ALF.   Feel this is in pt's best interest and will result in best cognitive outcome for her if this is an option as she will function best in a familiar environment, with familiar routine.      OT Assessment  Patient needs continued OT Services    Follow Up Recommendations  SNF    Barriers to Discharge Decreased caregiver support    Equipment Recommendations  None recommended by OT    Recommendations for Other Services    Frequency  Min 2X/week    Precautions / Restrictions Precautions Precautions: Fall Precaution Comments: dementia   Pertinent Vitals/Pain     ADL  Eating/Feeding: Set up;Moderate assistance (variable) Where Assessed - Eating/Feeding: Bed level Grooming: Teeth care;Wash/dry  face;Minimal assistance Where Assessed - Grooming: Unsupported sitting Upper Body Bathing: Maximal assistance Where Assessed - Upper Body Bathing: Unsupported sitting;Supported sitting Lower Body Bathing: +1 Total assistance Where Assessed - Lower Body Bathing: Supine, head of bed up;Rolling right and/or left Upper Body Dressing: Maximal assistance Where Assessed - Upper Body Dressing: Unsupported sitting;Supported sitting Lower Body Dressing: +1 Total assistance Where Assessed - Lower Body Dressing: Supported sit to stand Toilet Transfer: +1 Total assistance (did not attempt due to pain) Transfers/Ambulation Related to ADLs: did not attempt due to pain ADL Comments: Pt participatory.  she was oriented to situation, and was able to provide basic info re: PLOF.  She was very appropriate with OT. She was assisted to EOB and performed grooming EOB.  Pt requires increased time for all activity due to pain.  She moves best if she splints Rt ribs with pillow to reduce pain.  Dtr present at end of eval and with many questions.  Answered questions and provided support    OT Diagnosis: Generalized weakness;Acute pain;Cognitive deficits  OT Problem List: Decreased strength;Decreased activity tolerance;Impaired balance (sitting and/or standing);Decreased cognition;Decreased safety awareness;Decreased knowledge of use of DME or AE;Pain OT Treatment Interventions: Self-care/ADL training;DME and/or AE instruction;Therapeutic activities;Balance training;Patient/family education   OT Goals(Current goals can be found in the care plan section) Acute Rehab OT Goals Patient Stated Goal: to feel better OT Goal Formulation: With patient/family Time For Goal Achievement: 07/26/13 Potential to Achieve Goals: Good ADL Goals Pt Will  Perform Grooming: with supervision;sitting Pt Will Perform Upper Body Bathing: with min assist;sitting Pt Will Perform Lower Body Bathing: with mod assist;with adaptive equipment;sit  to/from stand Pt Will Perform Upper Body Dressing: with min assist;sitting Pt Will Transfer to Toilet: with mod assist;stand pivot transfer;bedside commode  Visit Information  Last OT Received On: 07/19/13 Assistance Needed: +2 (if attempting OOB) History of Present Illness: Patient is an 80 year old woman, resident of an ALF, who reportedly fell while transferring from her bed to a wheel chair.  Patient denies syncope, and has no current complaints other than the rib pain.         Prior [place] expects to be discharged to:: Assisted living Additional Comments: Pt lived at [facility].  Dtr is hopeful that she can return there at discharge rather than transitioning to SNF Prior Function Level of Independence: Needs assistance Gait / Transfers Assistance Needed: assist for transfers to wheel chair ADL's / Homemaking Assistance Needed: set up for LB ADLs.  Dtr reports that status was variable due to dementia and they were working on keeping schedule consistent and structured to reduce behavioral issues Communication Communication: HOH Dominant Hand: Right         Vision/Perception     Cognition  Cognition Arousal/Alertness: Awake/alert Behavior During Therapy: Anxious (mildly anxious with movement) Overall Cognitive Status: History of cognitive impairments - at baseline    Extremity/Trunk Assessment Upper Extremity Assessment Upper Extremity Assessment: Generalized weakness (difficult to accurately asses due to pain) Lower Extremity Assessment Lower Extremity Assessment: Defer to PT evaluation     Mobility Bed Mobility Overal bed mobility: Needs Assistance Bed Mobility: Supine to Sit;Sit to Supine Supine to sit: Max assist;HOB elevated Sit to supine: HOB elevated;Max assist General bed mobility comments: Pt attempted to initiate moving to EOB, but pain too severe.  Pt instructed how to splint ribs with pillow with  improvement in pain.  Pt required assist to lift shoulders from bed, and required assist to move back to supine with HOB elevated.  Pt does attempt to assist with all aspects, but pain is limiting     Exercise     Balance Balance Overall balance assessment: Needs assistance Sitting-balance support: Feet supported Sitting balance-Leahy Scale: Fair   End of Session OT - End of Session Activity Tolerance: Patient limited by pain Patient left: in bed;with call bell/phone within reach;with bed alarm set;with family/visitor present Nurse Communication: Mobility status  GO Functional Limitation: Self care Self Care Current Status (Z3299): At least 80 percent but less than 100 percent impaired, limited or restricted Self Care Goal Status (M4268): At least 40 percent but less than 60 percent impaired, limited or restricted   Obie Kallenbach M 07/19/2013, 1:22 PM

## 2013-07-19 NOTE — Progress Notes (Addendum)
Talked to patient about [facility] choices/ patient wants to return to the [facility] and receive her therapy there ; [place] called/ they contract with [facility] for [facility] needs; patient stated that it would be okay to use that [facility] agency; Mary with [facility] called for arrangements; Mindi Slicker RN,BSN,MHA [phone]

## 2013-07-19 NOTE — Progress Notes (Signed)
Subjective:  Patient continues to have confusion. She feels like she is a prisoner in the hospital she wishes to return home. She refuses meds and to allow staff to take care of her.   Objective: Vital signs in last 24 hours: Filed Vitals:   07/18/13 0519 07/18/13 0900 07/18/13 1803 07/18/13 2156  BP: 144/67 100/56 143/82 115/54  Pulse: 74 73 87 82  Temp: 97.3 F (36.3 C) 97.9 F (36.6 C) 98.3 F (36.8 C) 98.2 F (36.8 C)  TempSrc: Oral Oral Oral Oral  Resp: 20 20 20 20   Weight:      SpO2: 90% 95% 96% 96%   Weight change:  No intake or output data in the 24 hours ending 07/19/13 1128 Physical Exam  Cardiovascular: Normal rate, regular rhythm, normal heart sounds and intact distal pulses.  Exam reveals no friction rub.   No murmur heard. Pulmonary/Chest: Effort normal and breath sounds normal. No respiratory distress. She has no wheezes. She has no rales. She exhibits tenderness.  Neurological: She is alert.  Not oriented    Lab Results: Basic Metabolic Panel:  Recent Labs Lab 07/17/13 1145 07/18/13 0640  NA 144 145  K 4.3 3.7  CL 103 104  CO2 27 27  GLUCOSE 105* 104*  BUN 25* 23  CREATININE 1.47* 1.36*  CALCIUM 10.1 10.3   CBC:  Recent Labs Lab 07/17/13 1145 07/18/13 0640  WBC 6.6 6.8  NEUTROABS 5.0  --   HGB 10.8* 11.0*  HCT 33.0* 34.7*  MCV 97.3 99.4  PLT 117* 118*   Cardiac Enzymes:  Recent Labs Lab 07/17/13 1853  TROPONINI <0.30   Anemia Panel:  Recent Labs Lab 07/17/13 1853  VITAMINB12 317  FOLATE 10.5  FERRITIN 431*  TIBC 266  IRON 45  RETICCTPCT 3.0   Urinalysis:  Recent Labs Lab 07/17/13 1100  COLORURINE YELLOW  LABSPEC 1.011  PHURINE 7.0  GLUCOSEU NEGATIVE  HGBUR NEGATIVE  BILIRUBINUR NEGATIVE  KETONESUR NEGATIVE  PROTEINUR NEGATIVE  UROBILINOGEN 1.0  NITRITE NEGATIVE  LEUKOCYTESUR NEGATIVE   Studies/Results: Dg Ribs Unilateral W/chest Right  07/17/2013   CLINICAL DATA:  Status post fall now with right-sided  chest and rib discomfort  EXAM: RIGHT RIBS AND CHEST - 3+ VIEW  COMPARISON:  None.  FINDINGS: The lungs are mildly hypoinflated. No pneumothorax or significant pleural effusion is demonstrated. The cardiac silhouette is mildly enlarged. A permanent pacemaker-defibrillator is in place. The pulmonary vascularity is not engorged.  There is old deformity of the posterior lateral aspect of the right fifth rib. There are acute fractures of the right eighth through tenth ribs anterior laterally. There may be subtle fractures of the right sixth and seventh ribs as well. The right clavicle appears intact.  IMPRESSION: 1. The patient has sustained acute fractures of the right eighth through tenth ribs anterolaterally. The sixth and seventh ribs may be involved as well. There is an old fracture of the posterior lateral aspect of the right fifth rib. 2. There is no evidence of a pneumothorax nor significant pleural effusion. 3. There is stable enlargement of the cardiac silhouette with mild stable prominence of the pulmonary interstitial markings. These lung markings are accentuated by mild hypoinflation.   Electronically Signed   By: David  Martinique   On: 07/17/2013 12:20   Ct Chest W Contrast  07/17/2013   CLINICAL DATA Right flank pain post fall this morning, unwitnessed, history systolic heart failure, atrial fibrillation, smoking  EXAM CT CHEST, ABDOMEN, AND PELVIS WITH CONTRAST  TECHNIQUE Multidetector CT imaging of the chest, abdomen and pelvis was performed following the standard protocol during bolus administration of intravenous contrast. Sagittal and coronal MPR images reconstructed from axial data set.  CONTRAST 37mL OMNIPAQUE IOHEXOL 300 MG/ML SOLN. No oral contrast administered.  COMPARISON None  FINDINGS CT CHEST FINDINGS  Left subclavian AICD leads located at right atrium, right ventricle and coronary sinus.  Scattered atherosclerotic calcifications aorta, great vessels, and coronary arteries.  No thoracic  adenopathy.  Moderate-sized hiatal hernia.  Bibasilar atelectasis.  Peripheral scattered interstitial changes in the upper lobes early question fibrosis.  No segmental infiltrate, pleural effusion, or pneumothorax.  Minimally displaced fractures of the posterior right tenth and eleventh ribs.  Questionable sclerosis of the left posterior eighth rib  CT ABDOMEN AND PELVIS FINDINGS  6 mm cyst right lobe liver superiorly image 36.  Additional tiny nonspecific low-attenuation foci in liver 5 mm diameter or less.  Tiny nonobstructing calculus at lower pole right kidney image 66.  Liver, spleen, pancreas, kidneys, and adrenal glands normal appearance.  Scattered atherosclerotic calcification.  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  Minimal free pelvic fluid, low attenuation.  Nonspecific small subcutaneous hematoma right flank.  No mass, adenopathy, hernia, or free air.  Osseous demineralization with orthopedic hardware proximal right femur.  Compression deformities at superior endplate of L4 and inferior endplate of L1.  Mixed lytic and sclerotic process of T11 vertebral body.  IMPRESSION Moderate-sized hiatal hernia.  Bibasilar atelectasis and question minimal peripheral pulmonary fibrosis.  Fractures right posterior tenth and eleventh ribs.  No acute intra-abdominal or intrapelvic abnormalities.  Questionable sclerosis left eighth rib.  Mixed lytic and sclerotic process involving the T11 vertebral body, of uncertain age, not previously imaged ; metastatic disease not excluded, does patient have a history of malignancy?  Consider followup nonemergent radionuclide bone scan for further osseous assessment.  SIGNATURE  Electronically Signed   By: Lavonia Dana M.D.   On: 07/17/2013 14:47   Ct Abdomen Pelvis W Contrast  07/17/2013   CLINICAL DATA Right flank pain post fall this morning, unwitnessed, history systolic heart failure, atrial fibrillation, smoking  EXAM CT CHEST, ABDOMEN, AND PELVIS WITH  CONTRAST  TECHNIQUE Multidetector CT imaging of the chest, abdomen and pelvis was performed following the standard protocol during bolus administration of intravenous contrast. Sagittal and coronal MPR images reconstructed from axial data set.  CONTRAST 40mL OMNIPAQUE IOHEXOL 300 MG/ML SOLN. No oral contrast administered.  COMPARISON None  FINDINGS CT CHEST FINDINGS  Left subclavian AICD leads located at right atrium, right ventricle and coronary sinus.  Scattered atherosclerotic calcifications aorta, great vessels, and coronary arteries.  No thoracic adenopathy.  Moderate-sized hiatal hernia.  Bibasilar atelectasis.  Peripheral scattered interstitial changes in the upper lobes early question fibrosis.  No segmental infiltrate, pleural effusion, or pneumothorax.  Minimally displaced fractures of the posterior right tenth and eleventh ribs.  Questionable sclerosis of the left posterior eighth rib  CT ABDOMEN AND PELVIS FINDINGS  6 mm cyst right lobe liver superiorly image 36.  Additional tiny nonspecific low-attenuation foci in liver 5 mm diameter or less.  Tiny nonobstructing calculus at lower pole right kidney image 66.  Liver, spleen, pancreas, kidneys, and adrenal glands normal appearance.  Scattered atherosclerotic calcification.  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  Minimal free pelvic fluid, low attenuation.  Nonspecific small subcutaneous hematoma right flank.  No mass, adenopathy, hernia, or free air.  Osseous demineralization with orthopedic hardware proximal right femur.  Compression deformities  at superior endplate of L4 and inferior endplate of L1.  Mixed lytic and sclerotic process of T11 vertebral body.  IMPRESSION Moderate-sized hiatal hernia.  Bibasilar atelectasis and question minimal peripheral pulmonary fibrosis.  Fractures right posterior tenth and eleventh ribs.  No acute intra-abdominal or intrapelvic abnormalities.  Questionable sclerosis left eighth rib.  Mixed lytic and  sclerotic process involving the T11 vertebral body, of uncertain age, not previously imaged ; metastatic disease not excluded, does patient have a history of malignancy?  Consider followup nonemergent radionuclide bone scan for further osseous assessment.  SIGNATURE  Electronically Signed   By: Lavonia Dana M.D.   On: 07/17/2013 14:47   Medications: I have reviewed the patient's current medications. Scheduled Meds: . allopurinol  100 mg Oral Daily  . aspirin EC  81 mg Oral Daily  . calcium carbonate  1 tablet Oral BID WC  . carvedilol  6.25 mg Oral BID WC  . enoxaparin (LOVENOX) injection  30 mg Subcutaneous Q24H  . fentaNYL  25 mcg Transdermal Q72H  . furosemide  40 mg Oral BID  . hydrALAZINE  10 mg Oral 3 times per day  . isosorbide mononitrate  30 mg Oral Daily  . levothyroxine  88 mcg Oral QAC breakfast  . pantoprazole  40 mg Oral BID AC  . polyvinyl alcohol  1 drop Both Eyes TID  . senna-docusate  1 tablet Oral QHS  . sertraline  100 mg Oral Daily  . sodium chloride  3 mL Intravenous Q12H  . white petrolatum       Continuous Infusions:  PRN Meds:.sodium chloride, ondansetron (ZOFRAN) IV, ondansetron, oxyCODONE, sodium chloride Assessment/Plan: Principal Problem:   Broken ribs Active Problems:   OBSTRUCTIVE SLEEP APNEA   Biventricular implantable cardioverter-defibrillator LV lead off   Chronic systolic congestive heart failure  Dementia The patient does not appear to be doing well in the foreign environment of the hospital. likely due to dementia and change in environment. Drug side effects are certainly possible was well.  - Plan for discharge back to ALF as soon as possible - Need SW to determine if ALF can provide adequate supportive care while patient recovers from her fall.  Rib Fracture Stable rib fractures. Patient continues to have significant chest wall pain as to be expected.  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 There is a question of malignancy. Radiology recommend outpatient f/u with radionucleotide bone scan.   Chronic systolic CHF  The patient appear euvolemic. This appears stable.  - Continue home meds (coreg 6.25 qd, hydral 10 mg TID, imdur 30 mg qd, lasix 40 mg BID)  - Tele   Anemia  Appears stable. Borderline macrocytosis.  - F/U b12 (317, low normal), folate (nl) - start monthly b12 as outpatient  GERD  Appears stable, continue home PPI.   Mood D/O  Continue home zoloft   Hypothyroid  Continue home levothyroxine.   OSA  Not on home CPAP.   Diet: Heart Diet  DVT: Lovenox    Dispo: Disposition is deferred at this time, awaiting improvement of current medical problems.  Anticipated discharge in approximately 1 day(s).   The patient does have a current PCP Wayland Salinas, MD) and does not need an [facility] hospital follow-up appointment after discharge.  The patient does have transportation limitations that hinder transportation  to clinic appointments.  .Services Needed at time of discharge: Y = Yes, Blank = No PT:   OT:   RN:   Equipment:   Other:     LOS: 2 days   Marrion Coy, MD 07/19/2013, 11:28 AM

## 2013-07-19 NOTE — Discharge Summary (Signed)
Name: [name] [name] MRN: [id number] DOB: 01-23-1925 80 y.o. PCP: Wayland Salinas, MD  Date of Admission: 07/17/2013 10:17 AM Date of Discharge: 07/19/2013 Attending Physician: Axel Filler, MD  Discharge Diagnosis:  Principal Problem:   Broken ribs Active Problems:   OBSTRUCTIVE SLEEP APNEA   Biventricular implantable cardioverter-defibrillator LV lead off   Chronic systolic congestive heart failure  Discharge Medications:   Medication List    STOP taking these medications       diphenhydramine-acetaminophen 25-500 MG Tabs  Commonly known as:  TYLENOL PM     OxyCODONE HCl (Abuse Deter) 5 MG Taba  Replaced by:  oxyCODONE 5 MG immediate release tablet      TAKE these medications       allopurinol 100 MG tablet  Commonly known as:  ZYLOPRIM  Take 100 mg by mouth daily.     artificial tears Oint ophthalmic ointment  Place 1 application into both eyes at bedtime.     aspirin EC 81 MG tablet  Take 1 tablet (81 mg total) by mouth daily.     calcium carbonate 1250 MG tablet  Commonly known as:  OS-CAL - dosed in mg of elemental calcium  Take 1 tablet by mouth 2 (two) times daily with a meal.     carvedilol 6.25 MG tablet  Commonly known as:  COREG  Take 6.25 mg by mouth 2 (two) times daily with a meal.     ergocalciferol 50000 UNITS capsule  Commonly known as:  VITAMIN D2  Take 50,000 Units by mouth once a week. On wednesdays     fentaNYL 25 MCG/HR patch  Commonly known as:  [place] - dosed mcg/hr  Place 1 patch onto the skin every 3 (three) days.     furosemide 40 MG tablet  Commonly known as:  LASIX  Take 40 mg by mouth 2 (two) times daily.     hydrALAZINE 10 MG tablet  Commonly known as:  APRESOLINE  Take 10 mg by mouth 3 (three) times daily. 3 times daily, breakfast, 2:00 pm and bedtime     HYDROcodone-acetaminophen 5-325 MG per tablet  Commonly known as:  NORCO/VICODIN  Take 1 tablet by mouth every 8 (eight) hours as needed  (chronic back pain).     isosorbide mononitrate 30 MG 24 hr tablet  Commonly known as:  IMDUR  Take 30 mg by mouth daily.     levothyroxine 88 MCG tablet  Commonly known as:  SYNTHROID, LEVOTHROID  Take 88 mcg by mouth daily.     neomycin-bacitracin-polymyxin ointment  Commonly known as:  NEOSPORIN  Apply 1 application topically daily as needed for wound care. apply to eye     oxyCODONE 5 MG immediate release tablet  Commonly known as:  Oxy IR/ROXICODONE  One-two tablets (5-10 mg) every 6 hours as needed for pain.     pantoprazole 40 MG tablet  Commonly known as:  PROTONIX  Take 40 mg by mouth 2 (two) times daily before a meal.     polyvinyl alcohol 1.4 % ophthalmic solution  Commonly known as:  LIQUIFILM TEARS  Place 1 drop into both eyes 3 (three) times daily.     potassium chloride SA 20 MEQ tablet  Commonly known as:  K-DUR,KLOR-CON  Take 40 mEq by mouth 2 (two) times daily.     sertraline 100 MG tablet  Commonly known as:  ZOLOFT  Take 100 mg by mouth daily.     vitamin C 500 MG tablet  Commonly known as:  ASCORBIC ACID  Take 500 mg by mouth daily.        Disposition and follow-up:   [name] [name] was discharged from [facility] in [place] condition.  At the hospital follow up visit please address:  1.  Rib Fracture, Dementia, Possible lytic lesion in T11, Borderline Macrocytosis (start monthly B12)  2.  Labs / imaging needed at time of follow-up: Radionucleotide bone scan  3.  Pending labs/ test needing follow-up: None.  Follow-up Appointments:     Follow-up Information   Follow up with Orvis Brill, NP On 07/25/2013. (9;40 am)       Discharge Instructions:  Future Appointments Provider Department Dept Phone   08/20/2013 9:00 AM [facility] [place] [phone]   08/27/2013 9:55 AM [facility] [place] [facility] [phone]      Consultations:    Procedures  Performed:  Dg Ribs Unilateral W/chest Right  07/17/2013   CLINICAL DATA:  Status post fall now with right-sided chest and rib discomfort  EXAM: RIGHT RIBS AND CHEST - 3+ VIEW  COMPARISON:  None.  FINDINGS: The lungs are mildly hypoinflated. No pneumothorax or significant pleural effusion is demonstrated. The cardiac silhouette is mildly enlarged. A permanent pacemaker-defibrillator is in place. The pulmonary vascularity is not engorged.  There is old deformity of the posterior lateral aspect of the right fifth rib. There are acute fractures of the right eighth through tenth ribs anterior laterally. There may be subtle fractures of the right sixth and seventh ribs as well. The right clavicle appears intact.  IMPRESSION: 1. The patient has sustained acute fractures of the right eighth through tenth ribs anterolaterally. The sixth and seventh ribs may be involved as well. There is an old fracture of the posterior lateral aspect of the right fifth rib. 2. There is no evidence of a pneumothorax nor significant pleural effusion. 3. There is stable enlargement of the cardiac silhouette with mild stable prominence of the pulmonary interstitial markings. These lung markings are accentuated by mild hypoinflation.   Electronically Signed   By: David  Martinique   On: 07/17/2013 12:20   Ct Chest W Contrast  07/17/2013   CLINICAL DATA Right flank pain post fall this morning, unwitnessed, history systolic heart failure, atrial fibrillation, smoking  EXAM CT CHEST, ABDOMEN, AND PELVIS WITH CONTRAST  TECHNIQUE Multidetector CT imaging of the chest, abdomen and pelvis was performed following the standard protocol during bolus administration of intravenous contrast. Sagittal and coronal MPR images reconstructed from axial data set.  CONTRAST 79mL OMNIPAQUE IOHEXOL 300 MG/ML SOLN. No oral contrast administered.  COMPARISON None  FINDINGS CT CHEST FINDINGS  Left subclavian AICD leads located at right atrium, right ventricle and  coronary sinus.  Scattered atherosclerotic calcifications aorta, great vessels, and coronary arteries.  No thoracic adenopathy.  Moderate-sized hiatal hernia.  Bibasilar atelectasis.  Peripheral scattered interstitial changes in the upper lobes early question fibrosis.  No segmental infiltrate, pleural effusion, or pneumothorax.  Minimally displaced fractures of the posterior right tenth and eleventh ribs.  Questionable sclerosis of the left posterior eighth rib  CT ABDOMEN AND PELVIS FINDINGS  6 mm cyst right lobe liver superiorly image 36.  Additional tiny nonspecific low-attenuation foci in liver 5 mm diameter or less.  Tiny nonobstructing calculus at lower pole right kidney image 66.  Liver, spleen, pancreas, kidneys, and adrenal glands normal appearance.  Scattered atherosclerotic calcification.  Unremarkable bladder with surgical absence of  uterus.  Minimal free pelvic fluid, low attenuation.  Nonspecific small subcutaneous hematoma right flank.  No mass, adenopathy, hernia, or free air.  Osseous demineralization with orthopedic hardware proximal right femur.  Compression deformities at superior endplate of L4 and inferior endplate of L1.  Mixed lytic and sclerotic process of T11 vertebral body.  IMPRESSION Moderate-sized hiatal hernia.  Bibasilar atelectasis and question minimal peripheral pulmonary fibrosis.  Fractures right posterior tenth and eleventh ribs.  No acute intra-abdominal or intrapelvic abnormalities.  Questionable sclerosis left eighth rib.  Mixed lytic and sclerotic process involving the T11 vertebral body, of uncertain age, not previously imaged ; metastatic disease not excluded, does patient have a history of malignancy?  Consider followup nonemergent radionuclide bone scan for further osseous assessment.  SIGNATURE  Electronically Signed   By: Lavonia Dana M.D.   On: 07/17/2013 14:47   Ct Abdomen Pelvis W Contrast  07/17/2013   CLINICAL DATA Right flank pain post fall this morning,  unwitnessed, history systolic heart failure, atrial fibrillation, smoking  EXAM CT CHEST, ABDOMEN, AND PELVIS WITH CONTRAST  TECHNIQUE Multidetector CT imaging of the chest, abdomen and pelvis was performed following the standard protocol during bolus administration of intravenous contrast. Sagittal and coronal MPR images reconstructed from axial data set.  CONTRAST 60mL OMNIPAQUE IOHEXOL 300 MG/ML SOLN. No oral contrast administered.  COMPARISON None  FINDINGS CT CHEST FINDINGS  Left subclavian AICD leads located at right atrium, right ventricle and coronary sinus.  Scattered atherosclerotic calcifications aorta, great vessels, and coronary arteries.  No thoracic adenopathy.  Moderate-sized hiatal hernia.  Bibasilar atelectasis.  Peripheral scattered interstitial changes in the upper lobes early question fibrosis.  No segmental infiltrate, pleural effusion, or pneumothorax.  Minimally displaced fractures of the posterior right tenth and eleventh ribs.  Questionable sclerosis of the left posterior eighth rib  CT ABDOMEN AND PELVIS FINDINGS  6 mm cyst right lobe liver superiorly image 36.  Additional tiny nonspecific low-attenuation foci in liver 5 mm diameter or less.  Tiny nonobstructing calculus at lower pole right kidney image 66.  Liver, spleen, pancreas, kidneys, and adrenal glands normal appearance.  Scattered atherosclerotic calcification.  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  Minimal free pelvic fluid, low attenuation.  Nonspecific small subcutaneous hematoma right flank.  No mass, adenopathy, hernia, or free air.  Osseous demineralization with orthopedic hardware proximal right femur.  Compression deformities at superior endplate of L4 and inferior endplate of L1.  Mixed lytic and sclerotic process of T11 vertebral body.  IMPRESSION Moderate-sized hiatal hernia.  Bibasilar atelectasis and question minimal peripheral pulmonary fibrosis.  Fractures right posterior tenth and eleventh ribs.   No acute intra-abdominal or intrapelvic abnormalities.  Questionable sclerosis left eighth rib.  Mixed lytic and sclerotic process involving the T11 vertebral body, of uncertain age, not previously imaged ; metastatic disease not excluded, does patient have a history of malignancy?  Consider followup nonemergent radionuclide bone scan for further osseous assessment.  SIGNATURE  Electronically Signed   By: Lavonia Dana M.D.   On: 07/17/2013 14:47    Admission HPI: [name] [name] is a 80 y.o. woman with a pmhx of dementia, and complex medication history detailed below who presents with a cc of fall this am. She is unable to provide detailed history regarding the fall due to her known dementia. The ALF reported that she appeared to fall while transferring from a chair while alone in her room. She was able to press the life alert button at that  time to get help. The patients daughter states that her mental status has actually seemed improved since the fall.    Hospital Course by problem list: Principal Problem:   Broken ribs Active Problems:   OBSTRUCTIVE SLEEP APNEA   Biventricular implantable cardioverter-defibrillator LV lead off   Chronic systolic congestive heart failure   Rib Fracture  Stable rib fractures after sustaining a mechanical fall while at ALF. Patient continues to have significant chest wall pain as to be expected. Pain controlled with fentanyl patch 25 mcg/hr with prn oxycodone IR. PT/OT rec's in PT/OT note.  Dementia  The patient does not appear to be doing well in the foreign environment of the hospital.  This is likely due to dementia. Drug side effects are certainly possible as well, but patients mental status is at baseline according to family. The patient continued to ask to be allowed to go home. She refused medication and would not allow the staff to help with with using the bathroom.   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 There is a question of malignancy. Radiology recommend outpatient f/u with radionucleotide bone scan.   Chronic systolic CHF  The patient appear euvolemic. This appears stable. Continued home meds (coreg 6.25 qd, hydral 10 mg TID, imdur 30 mg qd, lasix 40 mg BID).  Anemia  Appears stable. Borderline macrocytosis. B12 (317, low normal), folate (nl). I recommend starting B12 monthly as outpatient.  GERD  Appears stable, continue home PPI.   Mood D/O  Continue home zoloft   Hypothyroid  Continue home levothyroxine.   Discharge Vitals:   BP 119/56  Pulse 82  Temp(Src) 98.2 F (36.8 C) (Oral)  Resp 20  Wt 150 lb 2.1 oz (68.1 kg)  SpO2 96%  Discharge Labs:  No results found for this or any previous visit (from the past 24 hour(s)).  Signed: Marrion Coy, MD 07/19/2013, 1:46 PM   Time Spent on Discharge: 35 minutes Services Ordered on Discharge: None Equipment Ordered on Discharge: None

## 2013-07-19 NOTE — Progress Notes (Signed)
On call on floor to assess patient. Pt refused pain medication at this time as well, but is laying in bed quietly for on call. This nurse was told to treat patient with conservative measures. Upon being changed patient continues to scream out, and is aggressive towards nursing staff. Patient has been yelling for the past 8 minutes, and continues to do so. Will continue to monitor. Verdie Drown RN BSN.

## 2013-07-19 NOTE — Progress Notes (Signed)
Pt discharged back to [place] with [facility] set up with [place]. Daughter at the bedside at time of discharge. Instructions given, Transferred by PTAR awake, alert, and conversant.

## 2013-07-19 NOTE — Progress Notes (Signed)
Pt noted to be wet but refusing to be changed. Continues to yell out for help, but refuses nursing staffs help saying "y'all get out of here and get me help." Pt has had two attempts to get OOB. Bed alarm changed to sensitive. Will continue to monitor. Verdie Drown RN BSN.

## 2013-07-29 ENCOUNTER — Emergency Department (HOSPITAL_COMMUNITY): Payer: Medicare HMO

## 2013-07-29 ENCOUNTER — Emergency Department (HOSPITAL_COMMUNITY)
Admission: EM | Admit: 2013-07-29 | Discharge: 2013-07-29 | Disposition: A | Discharge status: Skilled Nursing Facility | Payer: Medicare HMO | Attending: Emergency Medicine | Admitting: Emergency Medicine

## 2013-07-29 ENCOUNTER — Encounter (HOSPITAL_COMMUNITY): Payer: Self-pay | Admitting: Emergency Medicine

## 2013-07-29 DIAGNOSIS — G8929 Other chronic pain: Secondary | ICD-10-CM | POA: Insufficient documentation

## 2013-07-29 DIAGNOSIS — Z8781 Personal history of (healed) traumatic fracture: Secondary | ICD-10-CM | POA: Insufficient documentation

## 2013-07-29 DIAGNOSIS — Z7982 Long term (current) use of aspirin: Secondary | ICD-10-CM | POA: Insufficient documentation

## 2013-07-29 DIAGNOSIS — Z860100 Personal history of colon polyps, unspecified: Secondary | ICD-10-CM | POA: Insufficient documentation

## 2013-07-29 DIAGNOSIS — M1990 Unspecified osteoarthritis, unspecified site: Secondary | ICD-10-CM | POA: Insufficient documentation

## 2013-07-29 DIAGNOSIS — N390 Urinary tract infection, site not specified: Secondary | ICD-10-CM

## 2013-07-29 DIAGNOSIS — Z79899 Other long term (current) drug therapy: Secondary | ICD-10-CM | POA: Insufficient documentation

## 2013-07-29 DIAGNOSIS — Z95810 Presence of automatic (implantable) cardiac defibrillator: Secondary | ICD-10-CM | POA: Insufficient documentation

## 2013-07-29 DIAGNOSIS — Z9989 Dependence on other enabling machines and devices: Secondary | ICD-10-CM | POA: Insufficient documentation

## 2013-07-29 DIAGNOSIS — G4733 Obstructive sleep apnea (adult) (pediatric): Secondary | ICD-10-CM | POA: Insufficient documentation

## 2013-07-29 DIAGNOSIS — I4891 Unspecified atrial fibrillation: Secondary | ICD-10-CM | POA: Insufficient documentation

## 2013-07-29 DIAGNOSIS — H409 Unspecified glaucoma: Secondary | ICD-10-CM | POA: Insufficient documentation

## 2013-07-29 DIAGNOSIS — I5022 Chronic systolic (congestive) heart failure: Secondary | ICD-10-CM | POA: Insufficient documentation

## 2013-07-29 DIAGNOSIS — Z87891 Personal history of nicotine dependence: Secondary | ICD-10-CM | POA: Insufficient documentation

## 2013-07-29 DIAGNOSIS — K219 Gastro-esophageal reflux disease without esophagitis: Secondary | ICD-10-CM | POA: Insufficient documentation

## 2013-07-29 DIAGNOSIS — N189 Chronic kidney disease, unspecified: Secondary | ICD-10-CM | POA: Insufficient documentation

## 2013-07-29 DIAGNOSIS — E039 Hypothyroidism, unspecified: Secondary | ICD-10-CM | POA: Insufficient documentation

## 2013-07-29 LAB — CBC
HCT: 38.5 % (ref 36.0–46.0)
Hemoglobin: 12.7 g/dL (ref 12.0–15.0)
MCH: 31.8 pg (ref 26.0–34.0)
MCHC: 33 g/dL (ref 30.0–36.0)
MCV: 96.5 fL (ref 78.0–100.0)
PLATELETS: 136 10*3/uL — AB (ref 150–400)
RBC: 3.99 MIL/uL (ref 3.87–5.11)
RDW: 15.5 % (ref 11.5–15.5)
WBC: 11.8 10*3/uL — AB (ref 4.0–10.5)

## 2013-07-29 LAB — COMPREHENSIVE METABOLIC PANEL
ALK PHOS: 133 U/L — AB (ref 39–117)
ALT: 11 U/L (ref 0–35)
AST: 24 U/L (ref 0–37)
Albumin: 3.5 g/dL (ref 3.5–5.2)
BILIRUBIN TOTAL: 0.7 mg/dL (ref 0.3–1.2)
BUN: 38 mg/dL — AB (ref 6–23)
CHLORIDE: 99 meq/L (ref 96–112)
CO2: 26 meq/L (ref 19–32)
Calcium: 10.3 mg/dL (ref 8.4–10.5)
Creatinine, Ser: 1.67 mg/dL — ABNORMAL HIGH (ref 0.50–1.10)
GFR calc Af Amer: 31 mL/min — ABNORMAL LOW (ref >90)
GFR, EST NON AFRICAN AMERICAN: 27 mL/min — AB (ref >90)
Glucose, Bld: 109 mg/dL — ABNORMAL HIGH (ref 70–99)
Potassium: 4.9 mEq/L (ref 3.7–5.3)
Sodium: 139 mEq/L (ref 137–147)
Total Protein: 7.5 g/dL (ref 6.0–8.3)

## 2013-07-29 LAB — URINALYSIS, ROUTINE W REFLEX MICROSCOPIC
Bilirubin Urine: NEGATIVE
Glucose, UA: NEGATIVE mg/dL
Ketones, ur: NEGATIVE mg/dL
NITRITE: POSITIVE — AB
PH: 7.5 (ref 5.0–8.0)
Protein, ur: NEGATIVE mg/dL
Specific Gravity, Urine: 1.014 (ref 1.005–1.030)
Urobilinogen, UA: 1 mg/dL (ref 0.0–1.0)

## 2013-07-29 LAB — URINE MICROSCOPIC-ADD ON

## 2013-07-29 MED ORDER — DEXTROSE 5 % IV SOLN
1.0000 g | Freq: Once | INTRAVENOUS | Status: AC
  Administered 2013-07-29: 1 g via INTRAVENOUS
  Filled 2013-07-29: qty 10

## 2013-07-29 MED ORDER — CEPHALEXIN 500 MG PO CAPS: 500.0000 mg | ORAL_CAPSULE | Freq: Four times a day (QID) | ORAL | Status: DC

## 2013-07-29 NOTE — ED Notes (Signed)
Patient tearful and upset. Patient states " I don't know why any of these questions matter. I want to be out of my daughter's hair." Daughter at bedside, reports pt has increased confusion since Wednesday, decreased PO intake. Pt has recent has recent hx of fall with right sided broken ribs and recent hx of constipation. Patient unsure of last BM; abdomen firm and non-tender. Pt follows commands, VSS. Foley in place from facility.

## 2013-07-29 NOTE — ED Provider Notes (Signed)
CSN: [id number]     Arrival date & time 07/29/13  1052 History   First MD Initiated Contact with Patient 07/29/13 1128     Chief Complaint  Patient presents with  . Altered Mental Status    L5 caveat: Altered mental status  HPI Patient brought to the emergency department for increasing confusion the past several days.  The patient has a history of UTIs in the facility was concerned about the possibility of another urinary tract infection.  Some reported cough.  No shortness of breath.  No reports of vomiting or diarrhea.  Recent fall with right-sided rib fractures.  She was hospitalized at that time and family reports that she developed some in-hospital delirium and sundowning-like symptoms.  Patient recently had a Foley catheter placed for some urinary retention.   Past Medical History  Diagnosis Date  . Atrial fibrillation   . BRONCHITIS, CHRONIC   . CHEST PAIN   . HYPOKALEMIA   . OBSTRUCTIVE SLEEP APNEA   . Palpitations   . SYSTOLIC HEART FAILURE, CHRONIC   . Depression   . CPAP (continuous positive airway pressure) dependence   . ACE-inhibitor cough   . Cardiac defibrillator in situ   . Chronic renal insufficiency   . GERD (gastroesophageal reflux disease)   . Hypothyroidism   . Hematuria   . Osteoarthritis (arthritis due to wear and tear of joints)   . Colon polyps   . Chronic rhinitis   . RLS (restless legs syndrome)   . Chronic back pain   . Arthritis   . Glaucoma   . Sleep apnea    Past Surgical History  Procedure Laterality Date  . Back surgery    . Cardiac defibrillator placement  2005     [place] [phone]  . Skin cancer excision    . Cardiac catheterization  07/29/2003     Congestive heart failure, questionable etiology.  Increased LVEDP of 20mmHg on cath --  Nonobstructive coronary disease --         . Insert / replace / remove pacemaker  01/10/2004    Aborted biventricular implantable cardiac defibrillator implantation, complicated by probable  pneumothorax  --  Deboraha Sprang, M.D.  . Atrial cardiac pacemaker insertion  01/15/2004    St. Jude 1581 65 cm dual coil active fixation defibrillator lead, serial number [id number] --  St. Jude Atlas +HF model V340CRT defibrillator, serial [id number]. -- Biventricular implantable cardioverter-defibrillator implantation, contrast venography and intraoperative defibrillation threshold testing -- SURGEON:  Deboraha Sprang, M.D.  . Cardiac pacemaker placement  03/17/2009    generator change  --  [place] ICD model [phone], serial number [id number]  -- Deboraha Sprang, MD, [facility]   . Femur fracture surgery    . Esophagogastroduodenoscopy  04/13/2011    Procedure: ESOPHAGOGASTRODUODENOSCOPY (EGD);  Surgeon: Zenovia Jarred, MD;  Location: [facility];  Service: Gastroenterology;  Laterality: N/A;  may need dilation   Family History  Problem Relation Age of Onset  . Heart disease Mother   . Colon cancer Mother   . Colon polyps Mother   . Emphysema Father   . Liver disease Brother   . Kidney disease Mother   . Kidney disease Maternal Grandmother    History  Substance Use Topics  . Smoking status: Former Smoker -- 1.00 packs/day    Types: Cigarettes    Quit date: 04/10/2003  . Smokeless tobacco: Never Used  . Alcohol Use: No   OB History   Grav Para  Term Preterm Abortions TAB SAB Ect Mult Living                 Review of Systems  Unable to perform ROS: Mental status change      Allergies  Review of patient's allergies indicates no known allergies.  Home Medications   Current Outpatient Rx  Name  Route  Sig  Dispense  Refill  . allopurinol (ZYLOPRIM) 100 MG tablet   Oral   Take 100 mg by mouth daily.           . Ascorbic Acid (VITAMIN C) 500 MG tablet   Oral   Take 500 mg by mouth daily.           [name] aspirin EC 81 MG tablet   Oral   Take 1 tablet (81 mg total) by mouth daily.         . calcium carbonate (OS-CAL - DOSED IN MG OF ELEMENTAL CALCIUM) 1250 MG tablet   Oral    Take 1 tablet by mouth 2 (two) times daily with a meal.         . carvedilol (COREG) 6.25 MG tablet   Oral   Take 6.25 mg by mouth 2 (two) times daily with a meal.         . ergocalciferol (VITAMIN D2) 50000 UNITS capsule   Oral   Take 50,000 Units by mouth once a week. On wednesdays         . fentaNYL (DURAGESIC - DOSED MCG/HR) 25 MCG/HR   Transdermal   Place 1 patch onto the skin every 3 (three) days.           . furosemide (LASIX) 40 MG tablet   Oral   Take 40 mg by mouth 2 (two) times daily.          . hydrALAZINE (APRESOLINE) 10 MG tablet   Oral   Take 10 mg by mouth 3 (three) times daily. 3 times daily, breakfast, 2:00 pm and bedtime         . HYDROcodone-acetaminophen (NORCO/VICODIN) 5-325 MG per tablet   Oral   Take 1 tablet by mouth every 8 (eight) hours as needed for moderate pain or severe pain.         . isosorbide mononitrate (IMDUR) 30 MG 24 hr tablet   Oral   Take 30 mg by mouth daily.         [name] levothyroxine (SYNTHROID, LEVOTHROID) 88 MCG tablet   Oral   Take 88 mcg by mouth daily.           [name] neomycin-bacitracin-polymyxin (NEOSPORIN) ointment   Topical   Apply 1 application topically daily as needed for wound care. apply to eye         . oxyCODONE (OXY IR/ROXICODONE) 5 MG immediate release tablet   Oral   Take 10 mg by mouth every 4 (four) hours as needed for severe pain.         . pantoprazole (PROTONIX) 40 MG tablet   Oral   Take 40 mg by mouth 2 (two) times daily before a meal.         . polyethylene glycol (MIRALAX / GLYCOLAX) packet   Oral   Take 17 g by mouth daily.         . polyvinyl alcohol (LIQUIFILM TEARS) 1.4 % ophthalmic solution   Both Eyes   Place 1 drop into both eyes 3 (three) times daily.         [name]  potassium chloride SA (K-DUR,KLOR-CON) 20 MEQ tablet   Oral   Take 40 mEq by mouth 2 (two) times daily.          . sertraline (ZOLOFT) 100 MG tablet   Oral   Take 100 mg by mouth daily.            Dema Severin Petrolatum-Mineral Oil (ARTIFICIAL TEARS) OINT ophthalmic ointment   Both Eyes   Place 1 application into both eyes at bedtime.         . cephALEXin (KEFLEX) 500 MG capsule   Oral   Take 1 capsule (500 mg total) by mouth 4 (four) times daily.   28 capsule   0    BP 137/79  Pulse 81  Temp(Src) 97.9 F (36.6 C) (Oral)  Resp 27  SpO2 95% Physical Exam  Nursing note and vitals reviewed. Constitutional: She appears well-developed and well-nourished. No distress.  HENT:  Head: Normocephalic and atraumatic.  Eyes: EOM are normal.  Neck: Normal range of motion.  Cardiovascular: Normal rate, regular rhythm and normal heart sounds.   Pulmonary/Chest: Effort normal and breath sounds normal.  Abdominal: Soft. She exhibits no distension. There is no tenderness.  Musculoskeletal: Normal range of motion.  Neurological: She is alert.  Oriented to person place and but not time  Skin: Skin is warm and dry.  Psychiatric: She has a normal mood and affect. Judgment normal.    ED Course  Procedures (including critical care time) Labs Review Labs Reviewed  COMPREHENSIVE METABOLIC PANEL - Abnormal; Notable for the following:    Glucose, Bld 109 (*)    BUN 38 (*)    Creatinine, Ser 1.67 (*)    Alkaline Phosphatase 133 (*)    GFR calc non Af Amer 27 (*)    GFR calc Af Amer 31 (*)    All other components within normal limits  CBC - Abnormal; Notable for the following:    WBC 11.8 (*)    Platelets 136 (*)    All other components within normal limits  URINALYSIS, ROUTINE W REFLEX MICROSCOPIC - Abnormal; Notable for the following:    APPearance CLOUDY (*)    Hgb urine dipstick MODERATE (*)    Nitrite POSITIVE (*)    Leukocytes, UA LARGE (*)    All other components within normal limits  URINE MICROSCOPIC-ADD ON - Abnormal; Notable for the following:    Squamous Epithelial / LPF MANY (*)    Bacteria, UA MANY (*)    All other components within normal limits  URINE CULTURE    Imaging Review Dg Chest 2 View  07/29/2013   CLINICAL DATA:  Altered mental status, AFib  EXAM: CHEST  2 VIEW  COMPARISON:  CT chest dated 07/17/2013  FINDINGS: Chronic interstitial markings. Possible scarring in the left upper lobe. No focal consolidation. No pleural effusion or pneumothorax.  The heart is normal in size.  Left subclavian ICD.  Degenerative changes of the visualized thoracolumbar spine. Known sclerotic vertebral body at T11 is better visualized on CT.  IMPRESSION: No evidence of acute cardiopulmonary disease.   Electronically Signed   By: Julian Hy M.D.   On: 07/29/2013 13:34   Ct Head Wo Contrast  07/29/2013   CLINICAL DATA:  Confusion for 4 days, fell 1.5 weeks ago fracturing right ribs, history CHF, possible UTI  EXAM: CT HEAD WITHOUT CONTRAST  TECHNIQUE: Contiguous axial images were obtained from the base of the skull through the vertex without intravenous contrast.  COMPARISON:  12/26/2008  FINDINGS: Generalized atrophy.  Normal ventricular morphology.  No midline shift or mass effect.  Small vessel chronic ischemic changes of deep cerebral white matter.  Large old appearing posterior right temporoparietal infarct though new since previous exam.  No intracranial hemorrhage, mass lesion, or evidence acute infarction.  No extra-axial fluid collections.  Bones and sinuses unremarkable.  IMPRESSION: Atrophy with small vessel chronic ischemic changes of deep cerebral white matter.  Large old appearing right posterior parietotemporal infarct, new since 2010.  No acute intracranial abnormalities.   Electronically Signed   By: Lavonia Dana M.D.   On: 07/29/2013 13:22  I personally reviewed the imaging tests through PACS system I reviewed available ER/hospitalization records through the EMR    EKG Interpretation None      MDM   Final diagnoses:  Urinary tract infection    Foley catheter was removed and a new indwelling Foley catheter was placed.  Urine sample obtained is  concerning for urinary tract infection.  Patient be started on Rocephin.  Urine culture sent.  Home with Keflex.  No other significant abnormalities noted 2 labs and CT scan as well as chest x-ray.  Discharge back to the facility with antibiotics.  Close PCP followup.  All questions were answered.  Chronic renal insufficiency with BUN and creatinine similar to priors    Hoy Morn, MD 07/29/13 1409

## 2013-07-29 NOTE — ED Notes (Addendum)
Received pt from [facility] via EMS with c/o confusion since Wednesday. Pt has history of UTI's. Daughter visited with pt today and had the staff call the Dr. The Dr sent pt to ED for eval of possible UTI. Pt fell 1 1/2 weeks ago and sustained right sided cracked ribs per EMS. Pt presents with foley.

## 2013-08-01 LAB — URINE CULTURE: Colony Count: >=100000

## 2013-08-06 ENCOUNTER — Encounter: Payer: Self-pay | Admitting: Internal Medicine

## 2013-08-12 ENCOUNTER — Emergency Department (HOSPITAL_COMMUNITY)
Admission: EM | Admit: 2013-08-12 | Discharge: 2013-08-13 | Disposition: A | Discharge status: Home / Self Care | Payer: Medicare HMO | Attending: Emergency Medicine | Admitting: Emergency Medicine

## 2013-08-12 ENCOUNTER — Encounter (HOSPITAL_COMMUNITY): Payer: Self-pay | Admitting: Emergency Medicine

## 2013-08-12 DIAGNOSIS — Z9981 Dependence on supplemental oxygen: Secondary | ICD-10-CM | POA: Insufficient documentation

## 2013-08-12 DIAGNOSIS — Z79899 Other long term (current) drug therapy: Secondary | ICD-10-CM | POA: Insufficient documentation

## 2013-08-12 DIAGNOSIS — M1990 Unspecified osteoarthritis, unspecified site: Secondary | ICD-10-CM | POA: Insufficient documentation

## 2013-08-12 DIAGNOSIS — I4891 Unspecified atrial fibrillation: Secondary | ICD-10-CM | POA: Insufficient documentation

## 2013-08-12 DIAGNOSIS — G4733 Obstructive sleep apnea (adult) (pediatric): Secondary | ICD-10-CM | POA: Insufficient documentation

## 2013-08-12 DIAGNOSIS — H409 Unspecified glaucoma: Secondary | ICD-10-CM | POA: Insufficient documentation

## 2013-08-12 DIAGNOSIS — F3289 Other specified depressive episodes: Secondary | ICD-10-CM | POA: Insufficient documentation

## 2013-08-12 DIAGNOSIS — I5022 Chronic systolic (congestive) heart failure: Secondary | ICD-10-CM | POA: Insufficient documentation

## 2013-08-12 DIAGNOSIS — Z7982 Long term (current) use of aspirin: Secondary | ICD-10-CM | POA: Insufficient documentation

## 2013-08-12 DIAGNOSIS — K219 Gastro-esophageal reflux disease without esophagitis: Secondary | ICD-10-CM | POA: Insufficient documentation

## 2013-08-12 DIAGNOSIS — E039 Hypothyroidism, unspecified: Secondary | ICD-10-CM | POA: Insufficient documentation

## 2013-08-12 DIAGNOSIS — T83091A Other mechanical complication of indwelling urethral catheter, initial encounter: Secondary | ICD-10-CM | POA: Insufficient documentation

## 2013-08-12 DIAGNOSIS — G2581 Restless legs syndrome: Secondary | ICD-10-CM | POA: Insufficient documentation

## 2013-08-12 DIAGNOSIS — N189 Chronic kidney disease, unspecified: Secondary | ICD-10-CM | POA: Insufficient documentation

## 2013-08-12 DIAGNOSIS — F329 Major depressive disorder, single episode, unspecified: Secondary | ICD-10-CM | POA: Insufficient documentation

## 2013-08-12 DIAGNOSIS — Z860100 Personal history of colon polyps, unspecified: Secondary | ICD-10-CM | POA: Insufficient documentation

## 2013-08-12 DIAGNOSIS — G8929 Other chronic pain: Secondary | ICD-10-CM | POA: Insufficient documentation

## 2013-08-12 DIAGNOSIS — Z87440 Personal history of urinary (tract) infections: Secondary | ICD-10-CM | POA: Insufficient documentation

## 2013-08-12 DIAGNOSIS — Z95810 Presence of automatic (implantable) cardiac defibrillator: Secondary | ICD-10-CM | POA: Insufficient documentation

## 2013-08-12 DIAGNOSIS — Z8709 Personal history of other diseases of the respiratory system: Secondary | ICD-10-CM | POA: Insufficient documentation

## 2013-08-12 DIAGNOSIS — Z792 Long term (current) use of antibiotics: Secondary | ICD-10-CM | POA: Insufficient documentation

## 2013-08-12 DIAGNOSIS — Z9889 Other specified postprocedural states: Secondary | ICD-10-CM | POA: Insufficient documentation

## 2013-08-12 DIAGNOSIS — E876 Hypokalemia: Secondary | ICD-10-CM | POA: Insufficient documentation

## 2013-08-12 DIAGNOSIS — Z87891 Personal history of nicotine dependence: Secondary | ICD-10-CM | POA: Insufficient documentation

## 2013-08-12 DIAGNOSIS — Y846 Urinary catheterization as the cause of abnormal reaction of the patient, or of later complication, without mention of misadventure at the time of the procedure: Secondary | ICD-10-CM | POA: Insufficient documentation

## 2013-08-12 LAB — COMPREHENSIVE METABOLIC PANEL
ALBUMIN: 3.6 g/dL (ref 3.5–5.2)
ALK PHOS: 150 U/L — AB (ref 39–117)
ALT: 9 U/L (ref 0–35)
AST: 19 U/L (ref 0–37)
BILIRUBIN TOTAL: 0.8 mg/dL (ref 0.3–1.2)
BUN: 18 mg/dL (ref 6–23)
CO2: 24 mEq/L (ref 19–32)
Calcium: 9.8 mg/dL (ref 8.4–10.5)
Chloride: 98 mEq/L (ref 96–112)
Creatinine, Ser: 1.32 mg/dL — ABNORMAL HIGH (ref 0.50–1.10)
GFR calc Af Amer: 41 mL/min — ABNORMAL LOW (ref >90)
GFR calc non Af Amer: 35 mL/min — ABNORMAL LOW (ref >90)
Glucose, Bld: 119 mg/dL — ABNORMAL HIGH (ref 70–99)
POTASSIUM: 4 meq/L (ref 3.7–5.3)
Sodium: 138 mEq/L (ref 137–147)
TOTAL PROTEIN: 7.4 g/dL (ref 6.0–8.3)

## 2013-08-12 LAB — URINALYSIS, ROUTINE W REFLEX MICROSCOPIC
BILIRUBIN URINE: NEGATIVE
Glucose, UA: NEGATIVE mg/dL
HGB URINE DIPSTICK: NEGATIVE
Ketones, ur: NEGATIVE mg/dL
Nitrite: NEGATIVE
PROTEIN: NEGATIVE mg/dL
SPECIFIC GRAVITY, URINE: 1.014 (ref 1.005–1.030)
Urobilinogen, UA: 0.2 mg/dL (ref 0.0–1.0)
pH: 5.5 (ref 5.0–8.0)

## 2013-08-12 LAB — CBC WITH DIFFERENTIAL/PLATELET
BASOS ABS: 0 10*3/uL (ref 0.0–0.1)
BASOS PCT: 0 % (ref 0–1)
EOS ABS: 1 10*3/uL — AB (ref 0.0–0.7)
Eosinophils Relative: 7 % — ABNORMAL HIGH (ref 0–5)
HCT: 35.7 % — ABNORMAL LOW (ref 36.0–46.0)
HEMOGLOBIN: 11.8 g/dL — AB (ref 12.0–15.0)
Lymphocytes Relative: 6 % — ABNORMAL LOW (ref 12–46)
Lymphs Abs: 0.8 10*3/uL (ref 0.7–4.0)
MCH: 31.6 pg (ref 26.0–34.0)
MCHC: 33.1 g/dL (ref 30.0–36.0)
MCV: 95.7 fL (ref 78.0–100.0)
Monocytes Absolute: 0.7 10*3/uL (ref 0.1–1.0)
Monocytes Relative: 5 % (ref 3–12)
NEUTROS ABS: 11.2 10*3/uL — AB (ref 1.7–7.7)
NEUTROS PCT: 82 % — AB (ref 43–77)
Platelets: 128 10*3/uL — ABNORMAL LOW (ref 150–400)
RBC: 3.73 MIL/uL — ABNORMAL LOW (ref 3.87–5.11)
RDW: 15.7 % — ABNORMAL HIGH (ref 11.5–15.5)
WBC: 13.8 10*3/uL — ABNORMAL HIGH (ref 4.0–10.5)

## 2013-08-12 LAB — URINE MICROSCOPIC-ADD ON

## 2013-08-12 MED ORDER — ONDANSETRON HCL 4 MG/2ML IJ SOLN
4.0000 mg | Freq: Once | INTRAMUSCULAR | Status: AC
  Administered 2013-08-12: 4 mg via INTRAVENOUS
  Filled 2013-08-12: qty 2

## 2013-08-12 MED ORDER — MORPHINE SULFATE 4 MG/ML IJ SOLN
4.0000 mg | Freq: Once | INTRAMUSCULAR | Status: DC
  Filled 2013-08-12: qty 1

## 2013-08-12 MED ORDER — FLUCONAZOLE 100 MG PO TABS
150.0000 mg | ORAL_TABLET | Freq: Once | ORAL | Status: AC
  Administered 2013-08-13: 150 mg via ORAL
  Filled 2013-08-12: qty 2

## 2013-08-12 MED ORDER — ACETAMINOPHEN 325 MG PO TABS
650.0000 mg | ORAL_TABLET | Freq: Once | ORAL | Status: AC
  Administered 2013-08-12: 650 mg via ORAL
  Filled 2013-08-12: qty 2

## 2013-08-12 MED ORDER — MORPHINE SULFATE 2 MG/ML IJ SOLN
2.0000 mg | Freq: Once | INTRAMUSCULAR | Status: AC
  Administered 2013-08-12: 2 mg via INTRAVENOUS

## 2013-08-12 NOTE — ED Provider Notes (Signed)
Medical screening examination/treatment/procedure(s) were conducted as a shared visit with non-physician practitioner(s) and myself.  I personally evaluated the patient during the encounter.   EKG Interpretation None      80 year old female presenting with lower abdominal pain in the setting of a clogged Foley. She is being treated with Keflex for a urinary tract infection.  Foley removed and she subsequently was able to urinate, which resolved her pain. On my exam, well-appearing, not distressed, normal respiratory effort, normal perfusion, abdomen soft with no tenderness. She appears stable for discharge and continued outpatient treatment for her urinary tract infection.  Clinical Impression: 1. Complication, blocked Foley catheter       [name] Siren III, MD 08/12/13 2356

## 2013-08-12 NOTE — ED Notes (Signed)
PT does not wear 02 at home, but sats dropped to 90% and put 3 L on [place]. Sats rose to 97-99%

## 2013-08-12 NOTE — ED Notes (Signed)
Bladder scanner at bedside

## 2013-08-12 NOTE — Discharge Instructions (Signed)
Continue to finish the rest of your antibiotic for your urinary tract infection.  Continue with your medication.  Follow up with your doctor for further care.  Return if your symptoms worsen.

## 2013-08-12 NOTE — ED Provider Notes (Signed)
CSN: [id number]     Arrival date & time 08/12/13  2048 History   First MD Initiated Contact with Patient 08/12/13 2109     Chief Complaint  Patient presents with  . Abdominal Pain     (Consider location/radiation/quality/duration/timing/severity/associated sxs/prior Treatment) HPI  80 year old female with history of urinary tract infection, facial fibrillation, heart failure, chronic back pain, chronic renal insufficiency presenting with abdominal pain. Patient had a Foley catheter placed at the assisted facility because she is on Lasix and having to get up often to urinate.  She subsequently developed UTI, which requires abx.  She was altered require admission recently. She has been on antibiotic for UTI and according to daughter who is at bedside she is doing better. Today while eating dinner she complaining of significant low abnormal pain. Family noted that her Foley catheter was not putting out any urine. She was brought here for further evaluation of abdominal pain. Otherwise denies any fever, chills, nausea vomiting diarrhea, chest pain, shortness of breath, back pain.  Past Medical History  Diagnosis Date  . Atrial fibrillation   . BRONCHITIS, CHRONIC   . CHEST PAIN   . HYPOKALEMIA   . OBSTRUCTIVE SLEEP APNEA   . Palpitations   . SYSTOLIC HEART FAILURE, CHRONIC   . Depression   . CPAP (continuous positive airway pressure) dependence   . ACE-inhibitor cough   . Cardiac defibrillator in situ   . Chronic renal insufficiency   . GERD (gastroesophageal reflux disease)   . Hypothyroidism   . Hematuria   . Osteoarthritis (arthritis due to wear and tear of joints)   . Colon polyps   . Chronic rhinitis   . RLS (restless legs syndrome)   . Chronic back pain   . Arthritis   . Glaucoma   . Sleep apnea    Past Surgical History  Procedure Laterality Date  . Back surgery    . Cardiac defibrillator placement  2005     [place] [phone]  . Skin cancer excision    . Cardiac  catheterization  07/29/2003     Congestive heart failure, questionable etiology.  Increased LVEDP of 47mmHg on cath --  Nonobstructive coronary disease --         . Insert / replace / remove pacemaker  01/10/2004    Aborted biventricular implantable cardiac defibrillator implantation, complicated by probable pneumothorax  --  Deboraha Sprang, M.D.  . Atrial cardiac pacemaker insertion  01/15/2004    St. Jude 1581 65 cm dual coil active fixation defibrillator lead, serial number [id number] --  St. Jude Atlas +HF model V340CRT defibrillator, serial [id number]. -- Biventricular implantable cardioverter-defibrillator implantation, contrast venography and intraoperative defibrillation threshold testing -- SURGEON:  Deboraha Sprang, M.D.  . Cardiac pacemaker placement  03/17/2009    generator change  --  [place] ICD model [phone], serial number [id number]  -- Deboraha Sprang, MD, [facility]   . Femur fracture surgery    . Esophagogastroduodenoscopy  04/13/2011    Procedure: ESOPHAGOGASTRODUODENOSCOPY (EGD);  Surgeon: Zenovia Jarred, MD;  Location: [facility];  Service: Gastroenterology;  Laterality: N/A;  may need dilation   Family History  Problem Relation Age of Onset  . Heart disease Mother   . Colon cancer Mother   . Colon polyps Mother   . Emphysema Father   . Liver disease Brother   . Kidney disease Mother   . Kidney disease Maternal Grandmother    History  Substance Use Topics  .  Smoking status: Former Smoker -- 1.00 packs/day    Types: Cigarettes    Quit date: 04/10/2003  . Smokeless tobacco: Never Used  . Alcohol Use: No   OB History   Grav Para Term Preterm Abortions TAB SAB Ect Mult Living                 Review of Systems  Constitutional: Negative for fever.  Gastrointestinal: Positive for abdominal pain.  Genitourinary: Positive for dysuria.  All other systems reviewed and are negative.      Allergies  Review of patient's allergies indicates no known allergies.  Home  Medications   Current Outpatient Rx  Name  Route  Sig  Dispense  Refill  . allopurinol (ZYLOPRIM) 100 MG tablet   Oral   Take 100 mg by mouth daily.           . Ascorbic Acid (VITAMIN C) 500 MG tablet   Oral   Take 500 mg by mouth daily.           [name] aspirin EC 81 MG tablet   Oral   Take 1 tablet (81 mg total) by mouth daily.         . calcium carbonate (OS-CAL - DOSED IN MG OF ELEMENTAL CALCIUM) 1250 MG tablet   Oral   Take 1 tablet by mouth 2 (two) times daily with a meal.         . carvedilol (COREG) 6.25 MG tablet   Oral   Take 6.25 mg by mouth 2 (two) times daily with a meal.         . cephALEXin (KEFLEX) 500 MG capsule   Oral   Take 1 capsule (500 mg total) by mouth 4 (four) times daily.   28 capsule   0   . ergocalciferol (VITAMIN D2) 50000 UNITS capsule   Oral   Take 50,000 Units by mouth once a week. On wednesdays         . fentaNYL (DURAGESIC - DOSED MCG/HR) 25 MCG/HR   Transdermal   Place 1 patch onto the skin every 3 (three) days.           . furosemide (LASIX) 40 MG tablet   Oral   Take 40 mg by mouth 2 (two) times daily.          . hydrALAZINE (APRESOLINE) 10 MG tablet   Oral   Take 10 mg by mouth 3 (three) times daily. 3 times daily, breakfast, 2:00 pm and bedtime         . HYDROcodone-acetaminophen (NORCO/VICODIN) 5-325 MG per tablet   Oral   Take 1 tablet by mouth every 8 (eight) hours as needed for moderate pain or severe pain.         . isosorbide mononitrate (IMDUR) 30 MG 24 hr tablet   Oral   Take 30 mg by mouth daily.         [name] levothyroxine (SYNTHROID, LEVOTHROID) 88 MCG tablet   Oral   Take 88 mcg by mouth daily.           [name] neomycin-bacitracin-polymyxin (NEOSPORIN) ointment   Topical   Apply 1 application topically daily as needed for wound care. apply to eye         . oxyCODONE (OXY IR/ROXICODONE) 5 MG immediate release tablet   Oral   Take 10 mg by mouth every 4 (four) hours as needed for severe  pain.         . pantoprazole (PROTONIX)  40 MG tablet   Oral   Take 40 mg by mouth 2 (two) times daily before a meal.         . polyethylene glycol (MIRALAX / GLYCOLAX) packet   Oral   Take 17 g by mouth daily.         . polyvinyl alcohol (LIQUIFILM TEARS) 1.4 % ophthalmic solution   Both Eyes   Place 1 drop into both eyes 3 (three) times daily.         . potassium chloride SA (K-DUR,KLOR-CON) 20 MEQ tablet   Oral   Take 40 mEq by mouth 2 (two) times daily.          . sertraline (ZOLOFT) 100 MG tablet   Oral   Take 100 mg by mouth daily.           Dema Severin Petrolatum-Mineral Oil (ARTIFICIAL TEARS) OINT ophthalmic ointment   Both Eyes   Place 1 application into both eyes at bedtime.          BP 112/71  Pulse 93  Temp(Src) 97.8 F (36.6 C) (Oral)  Resp 29  SpO2 94% Physical Exam  Nursing note and vitals reviewed. Constitutional: She appears well-developed and well-nourished. No distress.  Awake, alert, nontoxic appearance  HENT:  Head: Atraumatic.  Eyes: Conjunctivae are normal. Right eye exhibits no discharge. Left eye exhibits no discharge.  Neck: Neck supple.  Cardiovascular: Normal rate and regular rhythm.   Pulmonary/Chest: Effort normal. No respiratory distress. She exhibits no tenderness.  Abdominal: Soft. There is no tenderness. There is no rebound.  Musculoskeletal: She exhibits no tenderness.  ROM appears intact, no obvious focal weakness  Neurological:  Mental status and motor strength appears intact  Skin: No rash noted.  Psychiatric: She has a normal mood and affect.    ED Course  Procedures (including critical care time)  10:25 PM Patient had indwelling Foley that had kinked causing her to have no abnormal discomfort. The Foley catheter was removed in the ED today with resolution of abdominal pain. She is currently pain-free. Will wait for her to urinate and family member prefers for patient not to have a Foley catheter.  11:41  PM Patient is able to urinate and felt much better. Other urine did show evidence of a possible urinary tract infection he appears to be improved from prior and she is currently finishing her antibiotic, Keflex which is sensitive on recent urine culture. Urine with evidence of yeast infection. we'll give Diflucan 150 mg by mouth once.  Care discussed with Dr. Doy Mince  Labs Review Labs Reviewed  CBC WITH DIFFERENTIAL - Abnormal; Notable for the following:    WBC 13.8 (*)    RBC 3.73 (*)    Hemoglobin 11.8 (*)    HCT 35.7 (*)    RDW 15.7 (*)    Platelets 128 (*)    Neutrophils Relative % 82 (*)    Neutro Abs 11.2 (*)    Lymphocytes Relative 6 (*)    Eosinophils Relative 7 (*)    Eosinophils Absolute 1.0 (*)    All other components within normal limits  COMPREHENSIVE METABOLIC PANEL - Abnormal; Notable for the following:    Glucose, Bld 119 (*)    Creatinine, Ser 1.32 (*)    Alkaline Phosphatase 150 (*)    GFR calc non Af Amer 35 (*)    GFR calc Af Amer 41 (*)    All other components within normal limits  URINALYSIS, ROUTINE W REFLEX MICROSCOPIC -  Abnormal; Notable for the following:    APPearance CLOUDY (*)    Leukocytes, UA LARGE (*)    All other components within normal limits  URINE MICROSCOPIC-ADD ON - Abnormal; Notable for the following:    Squamous Epithelial / LPF FEW (*)    Bacteria, UA FEW (*)    Casts HYALINE CASTS (*)    All other components within normal limits  URINE CULTURE   Imaging Review No results found.   EKG Interpretation None      MDM   Final diagnoses:  Complication, blocked Foley catheter    BP 112/66  Pulse 82  Temp(Src) 100.2 F (37.9 C) (Rectal)  Resp 25  SpO2 99%  I have reviewed nursing notes and vital signs. I reviewed available ER/hospitalization records thought the EMR     Domenic Moras, [place] 08/12/13 2349

## 2013-08-12 NOTE — ED Notes (Signed)
EMS and PT report onset of pain ~2hrs ago-suprapubic pain, pain while urinating. PT has indwelling foley placed 2 weeks ago-cloudy urine present in bag.

## 2013-08-13 NOTE — ED Notes (Signed)
[facility]AR taking pt home to [facility].

## 2013-08-14 ENCOUNTER — Encounter: Payer: Self-pay | Admitting: Internal Medicine

## 2013-08-14 ENCOUNTER — Telehealth: Payer: Self-pay | Admitting: *Deleted

## 2013-08-14 LAB — URINE CULTURE: Colony Count: >=100000

## 2013-08-14 NOTE — Telephone Encounter (Signed)
Alert on [facility] that pt was shocked yesterday at 12:04pm--I left a voicemail asking pt to call my direct line.

## 2013-08-14 NOTE — ED Provider Notes (Signed)
Medical screening examination/treatment/procedure(s) were conducted as a shared visit with non-physician practitioner(s) and myself.  I personally evaluated the patient during the encounter.   Please see my separate note.     Houston Siren III, MD 08/14/13 1020

## 2013-08-20 ENCOUNTER — Ambulatory Visit (INDEPENDENT_AMBULATORY_CARE_PROVIDER_SITE_OTHER): Payer: Medicare HMO | Admitting: Internal Medicine

## 2013-08-20 ENCOUNTER — Encounter: Payer: Self-pay | Admitting: Internal Medicine

## 2013-08-20 VITALS — BP 116/70 | HR 90 | Temp 97.4°F | Ht 61.0 in | Wt 137.8 lb

## 2013-08-20 DIAGNOSIS — S2239XA Fracture of one rib, unspecified side, initial encounter for closed fracture: Secondary | ICD-10-CM

## 2013-08-20 DIAGNOSIS — N183 Chronic kidney disease, stage 3 unspecified: Secondary | ICD-10-CM | POA: Insufficient documentation

## 2013-08-20 DIAGNOSIS — E039 Hypothyroidism, unspecified: Secondary | ICD-10-CM

## 2013-08-20 DIAGNOSIS — F0390 Unspecified dementia without behavioral disturbance: Secondary | ICD-10-CM

## 2013-08-20 DIAGNOSIS — M109 Gout, unspecified: Secondary | ICD-10-CM | POA: Insufficient documentation

## 2013-08-20 DIAGNOSIS — I5022 Chronic systolic (congestive) heart failure: Secondary | ICD-10-CM

## 2013-08-20 DIAGNOSIS — I4891 Unspecified atrial fibrillation: Secondary | ICD-10-CM

## 2013-08-20 DIAGNOSIS — IMO0002 Reserved for concepts with insufficient information to code with codable children: Secondary | ICD-10-CM

## 2013-08-20 DIAGNOSIS — R1310 Dysphagia, unspecified: Secondary | ICD-10-CM

## 2013-08-20 DIAGNOSIS — I509 Heart failure, unspecified: Secondary | ICD-10-CM

## 2013-08-20 DIAGNOSIS — F0150 Vascular dementia without behavioral disturbance: Secondary | ICD-10-CM | POA: Insufficient documentation

## 2013-08-20 DIAGNOSIS — S2249XA Multiple fractures of ribs, unspecified side, initial encounter for closed fracture: Secondary | ICD-10-CM

## 2013-08-20 MED ORDER — ZOSTER VACCINE LIVE 19400 UNT/0.65ML ~~LOC~~ SOLR: 0.6500 mL | Freq: Once | SUBCUTANEOUS | Status: DC

## 2013-08-20 MED ORDER — LEVOTHYROXINE SODIUM 88 MCG PO TABS: ORAL_TABLET | ORAL | Status: DC

## 2013-08-20 MED ORDER — FENTANYL 25 MCG/HR TD PT72: 25.0000 ug | MEDICATED_PATCH | TRANSDERMAL | Status: DC

## 2013-08-20 MED ORDER — CARVEDILOL 6.25 MG PO TABS: 6.2500 mg | ORAL_TABLET | Freq: Two times a day (BID) | ORAL | Status: DC

## 2013-08-20 MED ORDER — LUBRIFRESH P.M. OP OINT: 1.0000 "application " | TOPICAL_OINTMENT | Freq: Every day | OPHTHALMIC | Status: DC

## 2013-08-20 MED ORDER — HYDRALAZINE HCL 10 MG PO TABS: 10.0000 mg | ORAL_TABLET | Freq: Three times a day (TID) | ORAL | Status: DC

## 2013-08-20 MED ORDER — BACITRACIN-NEOMYCIN-POLYMYXIN 400-5-5000 EX OINT: 1.0000 "application " | TOPICAL_OINTMENT | Freq: Every day | CUTANEOUS | Status: DC | PRN

## 2013-08-20 MED ORDER — ISOSORBIDE MONONITRATE ER 30 MG PO TB24: 30.0000 mg | ORAL_TABLET | Freq: Every day | ORAL | Status: DC

## 2013-08-20 MED ORDER — POLYVINYL ALCOHOL 1.4 % OP SOLN: 1.0000 [drp] | Freq: Three times a day (TID) | OPHTHALMIC | Status: DC

## 2013-08-20 MED ORDER — SERTRALINE HCL 100 MG PO TABS: 100.0000 mg | ORAL_TABLET | Freq: Every day | ORAL | Status: DC

## 2013-08-20 MED ORDER — FUROSEMIDE 40 MG PO TABS: 40.0000 mg | ORAL_TABLET | Freq: Two times a day (BID) | ORAL | Status: DC

## 2013-08-20 MED ORDER — POTASSIUM CHLORIDE CRYS ER 20 MEQ PO TBCR: 40.0000 meq | EXTENDED_RELEASE_TABLET | Freq: Two times a day (BID) | ORAL | Status: DC

## 2013-08-20 MED ORDER — TETANUS-DIPHTHERIA TOXOIDS TD 2-2 LF/0.5ML IM SUSP: 0.5000 mL | Freq: Once | INTRAMUSCULAR | Status: DC

## 2013-08-20 MED ORDER — HYDROCODONE-ACETAMINOPHEN 5-325 MG PO TABS: 1.0000 | ORAL_TABLET | Freq: Three times a day (TID) | ORAL | Status: DC | PRN

## 2013-08-20 MED ORDER — PANTOPRAZOLE SODIUM 40 MG PO TBEC: 40.0000 mg | DELAYED_RELEASE_TABLET | Freq: Two times a day (BID) | ORAL | Status: DC

## 2013-08-20 MED ORDER — ALLOPURINOL 100 MG PO TABS: 100.0000 mg | ORAL_TABLET | Freq: Every day | ORAL | Status: DC

## 2013-08-20 MED ORDER — OXYCODONE HCL 5 MG PO TABS: 10.0000 mg | ORAL_TABLET | ORAL | Status: DC | PRN

## 2013-08-20 NOTE — Telephone Encounter (Signed)
Pt's daughter, Nevin Bloodgood, states her mother is asymptomatic and unaware that she was shocked. Pt stays in assisted living--Paula was never notified by the facility regarding an ICD episode. Nevin Bloodgood will follow up w/ the facility then call me back.

## 2013-08-20 NOTE — Progress Notes (Signed)
Patient ID: [name] [name], female   DOB: 17-Aug-1924, 80 y.o.   MRN: [id number]   Location:  [facility]  Code Status: has living will and her daughter is her 25, have not recently discussed DNR--discussed in detail today and talked about defibrillator lead disconnection again also  Allergies  Allergen Reactions  . Ace Inhibitors Cough  . Requip [Ropinirole Hcl] Other (See Comments)    confusion    Chief Complaint  Patient presents with  . Establish Care    NP-Estalish care  . other    needs to get back with [facility], Dr Posey Pronto; also would like to be able to take 2 tylenol at night to help with sleep  . Immunizations    T[employer] Shingle to be printed    HPI: Patient is a 80 y.o. white female seen in the office today to establish with the practice.    Referred by Jerene Pitch from [place].  Lived in [place] with her daughter temporarily, then needed more help so moved to AL at [facility] right after Christmas 2014.    She has CHF and CKD.  Seems to be stable per her daughter.    She has had a little bit more confusion, anxiety.  She's had some falls--had one fall with three broken ribs.  Her daughter questions if the mood/memory changes are due to medicines and trauma or progression of her dementia.  Has fentanyl patch and hydrocodone for breakthrough.  Has compressed nerves in low back.  Had femoral fx and has knee osteoarthritis.  Cannot undergo surgery due to her multiple chronic medical conditions.    Memory problems were noted about 4 years ago, after anesthesia for 3 hours of surgery.  UTIs precipitated even worse confusion--some delusions, hallucinations in that context.  Lately, in Oct 2014 when hospitalized--had delirium with UTI, weakness.  Then went to [facility]  rehab to work with therapy--then came to [facility].  Imagined bugs in the room and under her skin.  That time, UA negative.  Was picking at her  skin.  Friday night, said she didn't like when she was taken in the [place] and taken to the trailer.  Had gone to the hospital three weekends in a row and Nevin Bloodgood thinks that is what caused it.    Has been on fentanyl 8-9 yrs.    Has both oxycodone and hydrocodone for breakthrough.  Normally, does not use breakthrough meds.    Isosorbide and hydralazine are new.  Was taken of pravachol and fenofibrate at that time--may have been weaker.    Is doing therapy now.  Left knee had been giving out and causing falls.  Last episode occurred when she tried to make her bed.    Sleeps well at night--takes tylenol pm 2 tabs.  Was stopped at hospital, but pt says she took it for years--discussed in detail reason why this is not recommended in pts her age.  Has been sleeping without it.  Discussed melatonin.  For afib, used to be on coumadin, but was falling and had to keep getting CTs of her brain.  Continues on baby asa.  Briefly mentioned eliquis--advised to discuss with cardiology--? Whether this should be changed considering her overall goals of care at this time.    Eats pretty well, but food at facility not that great.  Had a period of time where she broke her ribs--did not eat well.  Lost weight.  Did have her esophagus  stretched by Dr. Hilarie Fredrickson.  Has lost 35 lbs in about a month, but appetite is back now.    Very seldom gets constipated now.  Had been impacted at one point after the rib fxs due to pain medications.  Got started on miralax.  Goes regularly now.  Breathing could be better.  Had been using walker until she [facility].  Uses wheelchair now to get around so dyspnea not noted this way.    She is sleepy a lot and takes a long afternoon nap.    Functional status:  CNAs help her bathe, dress, transport to dining room (sometimes eats in room), has hospital bed, eats on her own and help her transfer to use toilet or getting into bed--has alarms.  Feels like she is wired down. They don't set  up her tray--she is able to eat on her own.    Vision is not all that good.  Sees Dr. Duncan Dull seen in January.  They put drops in her eyes tid for dry eyes.  She says they do feel better.  Did not change prescription, but now wearing old glasses until new get fixed.    Has had some hearing loss, but has not been evaluated for this.      Review of Systems:  Review of Systems  Constitutional: Positive for weight loss and malaise/fatigue. Negative for fever.  HENT: Negative for congestion.   Eyes: Positive for blurred vision.  Respiratory: Positive for shortness of breath. Negative for cough and wheezing.   Cardiovascular: Negative for chest pain and leg swelling.  Gastrointestinal: Positive for heartburn. Negative for abdominal pain, constipation, blood in stool and melena.  Genitourinary: Negative for dysuria, urgency and frequency.  Musculoskeletal: Positive for back pain and falls. Negative for myalgias.  Skin: Positive for itching. Negative for rash.  Neurological: Positive for weakness. Negative for dizziness, loss of consciousness and headaches.  Endo/Heme/Allergies: Bruises/bleeds easily.  Psychiatric/Behavioral: Positive for memory loss. The patient has insomnia.        Has had hallucinations   Past Medical History  Diagnosis Date  . Atrial fibrillation   . BRONCHITIS, CHRONIC   . CHEST PAIN   . HYPOKALEMIA   . OBSTRUCTIVE SLEEP APNEA   . Palpitations   . SYSTOLIC HEART FAILURE, CHRONIC   . Depression   . CPAP (continuous positive airway pressure) dependence   . ACE-inhibitor cough   . Cardiac defibrillator in situ   . Chronic renal insufficiency   . GERD (gastroesophageal reflux disease)   . Hypothyroidism   . Hematuria   . Osteoarthritis (arthritis due to wear and tear of joints)   . Colon polyps   . Chronic rhinitis   . RLS (restless legs syndrome)   . Chronic back pain   . Arthritis   . Glaucoma   . Sleep apnea   . CHF (congestive heart failure)   .  Dementia   . H/O colonoscopy 2005    Past Surgical History  Procedure Laterality Date  . Back surgery  1973  . Cardiac defibrillator placement  2005     [facility] [phone]  . Skin cancer excision    . Cardiac catheterization  07/29/2003     Congestive heart failure, questionable etiology.  Increased LVEDP of 61mmHg on cath --  Nonobstructive coronary disease --         . Insert / replace / remove pacemaker  01/10/2004    Aborted biventricular implantable cardiac defibrillator implantation, complicated by  probable pneumothorax  --  Deboraha Sprang, M.D.  . Atrial cardiac pacemaker insertion  01/15/2004    St. Jude 1581 65 cm dual coil active fixation defibrillator lead, serial number [id number] --  St. Jude Atlas +HF model V340CRT defibrillator, serial [id number]. -- Biventricular implantable cardioverter-defibrillator implantation, contrast venography and intraoperative defibrillation threshold testing -- SURGEON:  Deboraha Sprang, M.D.  . Cardiac pacemaker placement  03/17/2009    generator change  --  Unify St. Jude ICD model [phone], serial number [id number]  -- Deboraha Sprang, MD, [facility]   . Femur fracture surgery  2010  . Esophagogastroduodenoscopy  04/13/2011    Procedure: ESOPHAGOGASTRODUODENOSCOPY (EGD);  Surgeon: Zenovia Jarred, MD;  Location: [facility];  Service: Gastroenterology;  Laterality: N/A;  may need dilation    Social History:   reports that she quit smoking about 10 years ago. Her smoking use included Cigarettes. She smoked 1.00 pack per day. She has never used smokeless tobacco. She reports that she does not drink alcohol or use illicit drugs.  Family History  Problem Relation Age of Onset  . Heart disease Mother   . Colon cancer Mother   . Colon polyps Mother   . Kidney disease Mother   . Emphysema Father   . Liver disease Brother   . Kidney disease Maternal Grandmother     Medications: Patient's Medications  New Prescriptions   No medications on file  Previous  Medications   ALLOPURINOL (ZYLOPRIM) 100 MG TABLET    Take 100 mg by mouth daily.     ASCORBIC ACID (VITAMIN C) 500 MG TABLET    Take 500 mg by mouth daily.     ASPIRIN EC 81 MG TABLET    Take 1 tablet (81 mg total) by mouth daily.   CALCIUM CARBONATE (OS-CAL - DOSED IN MG OF ELEMENTAL CALCIUM) 1250 MG TABLET    Take 1 tablet by mouth 2 (two) times daily with a meal.   CARVEDILOL (COREG) 6.25 MG TABLET    Take 6.25 mg by mouth 2 (two) times daily with a meal.   CRANBERRY-VITAMIN C-INULIN (UTI-STAT) LIQD    30 mLs. Taking 30 ml by mouth every morning   DIPTHERIA-TETANUS TOXOIDS (DECAVAC) 2-2 LF/0.5ML INJECTION    Inject 0.5 mLs into the muscle once.   ERGOCALCIFEROL (VITAMIN D2) 50000 UNITS CAPSULE    Take 50,000 Units by mouth once a week. On wednesdays   FENTANYL ([place] - DOSED MCG/HR) 25 MCG/HR    Place 1 patch onto the skin every 3 (three) days.     FUROSEMIDE (LASIX) 40 MG TABLET    Take 40 mg by mouth 2 (two) times daily.    HYDRALAZINE (APRESOLINE) 10 MG TABLET    Take 10 mg by mouth 3 (three) times daily. 3 times daily, breakfast, 2:00 pm and bedtime   HYDROCODONE-ACETAMINOPHEN (NORCO/VICODIN) 5-325 MG PER TABLET    Take 1 tablet by mouth every 8 (eight) hours as needed for moderate pain or severe pain.   ISOSORBIDE MONONITRATE (IMDUR) 30 MG 24 HR TABLET    Take 30 mg by mouth daily.   LEVOTHYROXINE (SYNTHROID, LEVOTHROID) 88 MCG TABLET    Take 88 mcg by mouth daily.     NEOMYCIN-BACITRACIN-POLYMYXIN (NEOSPORIN) OINTMENT    Apply 1 application topically daily as needed for wound care. apply to eye   OXYCODONE (OXY IR/ROXICODONE) 5 MG IMMEDIATE RELEASE TABLET    Take 10 mg by mouth every 4 (four) hours as needed for severe pain.  PANTOPRAZOLE (PROTONIX) 40 MG TABLET    Take 40 mg by mouth 2 (two) times daily before a meal.   POLYETHYLENE GLYCOL (MIRALAX / GLYCOLAX) PACKET    Take 17 g by mouth daily.   POLYVINYL ALCOHOL (LIQUIFILM TEARS) 1.4 % OPHTHALMIC SOLUTION    Place 1 drop into  both eyes 3 (three) times daily.   POTASSIUM CHLORIDE SA (K-DUR,KLOR-CON) 20 MEQ TABLET    Take 40 mEq by mouth 2 (two) times daily.    SERTRALINE (ZOLOFT) 100 MG TABLET    Take 100 mg by mouth daily.     WHITE PETROLATUM-MINERAL OIL (ARTIFICIAL TEARS) OINT OPHTHALMIC OINTMENT    Place 1 application into both eyes at bedtime.   ZOSTER VACCINE LIVE, PF, (ZOSTAVAX) 57846 UNT/0.65ML INJECTION    Inject 0.65 mLs into the skin once.  Modified Medications   No medications on file  Discontinued Medications   No medications on file     Physical Exam: Filed Vitals:   08/20/13 0920  BP: 116/70  Pulse: 90  Temp: 97.4 F (36.3 C)  TempSrc: Oral  Height: 5\' 1"  (1.549 m)  Weight: 137 lb 12.8 oz (62.506 kg)  SpO2: 97%  Physical Exam  Constitutional: No distress.  Frail white female  HENT:  Head: Normocephalic and atraumatic.  Right Ear: External ear normal.  Left Ear: External ear normal.  Nose: Nose normal.  Mouth/Throat: Oropharynx is clear and moist.  Eyes: EOM are normal. Pupils are equal, round, and reactive to light.  Wearing glasses  Neck: Neck supple. No JVD present.  Cardiovascular:  Murmur heard. irreg irreg  Pulmonary/Chest: Effort normal and breath sounds normal. No respiratory distress.  Abdominal: Soft. Bowel sounds are normal. She exhibits no distension. There is no tenderness.  Genitourinary:  No suprapubic tenderness  Musculoskeletal: Normal range of motion. She exhibits no edema and no tenderness.  Neurological: She is alert.  Oriented to person only  Skin: Skin is warm and dry.  Psychiatric: She has a normal mood and affect.     Labs reviewed: Basic Metabolic Panel:  Recent Labs  07/18/13 0640 07/29/13 1159 08/12/13 2151  NA 145 139 138  K 3.7 4.9 4.0  CL 104 99 98  CO2 27 26 24   GLUCOSE 104* 109* 119*  BUN 23 38* 18  CREATININE 1.36* 1.67* 1.32*  CALCIUM 10.3 10.3 9.8   Liver Function Tests:  Recent Labs  07/29/13 1159 08/12/13 2151  AST  24 19  ALT 11 9  ALKPHOS 133* 150*  BILITOT 0.7 0.8  PROT 7.5 7.4  ALBUMIN 3.5 3.6  CBC:  Recent Labs  07/17/13 1145 07/18/13 0640 07/29/13 1159 08/12/13 2151  WBC 6.6 6.8 11.8* 13.8*  NEUTROABS 5.0  --   --  11.2*  HGB 10.8* 11.0* 12.7 11.8*  HCT 33.0* 34.7* 38.5 35.7*  MCV 97.3 99.4 96.5 95.7  PLT 117* 118* 136* 128*   Assessment/Plan 1. Failure to thrive - seems due to her progressive dementia -will rule out reversible causes of cognitive decline and weight loss - CBC With differential/Platelet - Comprehensive metabolic panel  2. Broken ribs -pain improved -not using prns much--will plan to select just one and d/c other next visit after MAR provided  3. Dysphagia -previously treated for esophageal stenosis with esophageal dilatation with improvement  4. Chronic systolic congestive heart failure -stable also, no s/s of exacerbation, monitor weights, maintain stable fluid intake and low sodium diet -no med changes needed from this standpoint today  5. Atrial  fibrillation -on asa alone for anticoagulation due to frequent falls where she hit her head -fewer lately, could consider eliquis if cardiology was in favor of this -chose not to "rock the boat" today  -rate is well controlled   6. Chronic kidney disease (CKD), stage III (moderate) -will f/u cr to determine where she stands--if there has been progression, would refer back to reanl -avoid nsaids and other renal-toxic meds  7. Gout -no recent flares - check Uric Acid  8. Mixed vascular and neurodegenerative dementia -cont secondary prevention of strokes with bp, lipid control and asa use -not on aricept or namenda at this time--could consider these as pt still independent with meals and able to attend activities  9. Hypothyroidism -cont synthroid and f/u TSH  Labs/tests ordered:   Orders Placed This Encounter  Procedures  . HM DEXA SCAN    This external order was created through the Results Console.    [name] HM MAMMOGRAPHY    This external order was created through the Results Console.  [name] HM DEXA SCAN    This external order was created through the Results Console.  [name] HM PAP SMEAR    This external order was created through the Results Console.  [name] HM PAP SMEAR    This external order was created through the Results Console.  . CBC With differential/Platelet  . Comprehensive metabolic panel  . TSH  . Uric Acid  . HM COLONOSCOPY    This external order was created through the Results Console.  [name] HM COLONOSCOPY    This external order was created through the Results Console.    Next appt:  ~1 month with MMSE and EV

## 2013-08-21 LAB — COMPREHENSIVE METABOLIC PANEL
ALT: 11 IU/L (ref 0–32)
AST: 24 IU/L (ref 0–40)
Albumin/Globulin Ratio: 1.5 (ref 1.1–2.5)
Albumin: 4.1 g/dL (ref 3.5–4.7)
Alkaline Phosphatase: 147 IU/L — ABNORMAL HIGH (ref 39–117)
BUN/Creatinine Ratio: 12 (ref 11–26)
BUN: 18 mg/dL (ref 8–27)
CO2: 24 mmol/L (ref 18–29)
Calcium: 10.5 mg/dL — ABNORMAL HIGH (ref 8.7–10.3)
Chloride: 98 mmol/L (ref 97–108)
Creatinine, Ser: 1.54 mg/dL — ABNORMAL HIGH (ref 0.57–1.00)
GFR calc Af Amer: 35 mL/min/{1.73_m2} — ABNORMAL LOW (ref >59)
GFR calc non Af Amer: 30 mL/min/{1.73_m2} — ABNORMAL LOW (ref >59)
Globulin, Total: 2.7 g/dL (ref 1.5–4.5)
Glucose: 109 mg/dL — ABNORMAL HIGH (ref 65–99)
Potassium: 4.1 mmol/L (ref 3.5–5.2)
Sodium: 141 mmol/L (ref 134–144)
Total Bilirubin: 0.7 mg/dL (ref 0.0–1.2)
Total Protein: 6.8 g/dL (ref 6.0–8.5)

## 2013-08-21 LAB — CBC WITH DIFFERENTIAL
Basophils Absolute: 0 10*3/uL (ref 0.0–0.2)
Basos: 0 %
Eos: 11 %
Eosinophils Absolute: 1 10*3/uL — ABNORMAL HIGH (ref 0.0–0.4)
HCT: 34.9 % (ref 34.0–46.6)
Hemoglobin: 11.2 g/dL (ref 11.1–15.9)
Immature Grans (Abs): 0 10*3/uL (ref 0.0–0.1)
Immature Granulocytes: 0 %
Lymphocytes Absolute: 1 10*3/uL (ref 0.7–3.1)
Lymphs: 11 %
MCH: 31 pg (ref 26.6–33.0)
MCHC: 32.1 g/dL (ref 31.5–35.7)
MCV: 97 fL (ref 79–97)
Monocytes Absolute: 0.6 10*3/uL (ref 0.1–0.9)
Monocytes: 7 %
Neutrophils Absolute: 6.3 10*3/uL (ref 1.4–7.0)
Neutrophils Relative %: 71 %
Platelets: 127 10*3/uL — ABNORMAL LOW (ref 150–379)
RBC: 3.61 x10E6/uL — ABNORMAL LOW (ref 3.77–5.28)
RDW: 16.9 % — ABNORMAL HIGH (ref 12.3–15.4)
WBC: 8.9 10*3/uL (ref 3.4–10.8)

## 2013-08-21 LAB — TSH: TSH: 2.37 u[IU]/mL (ref 0.450–4.500)

## 2013-08-21 LAB — URIC ACID: Uric Acid: 6.7 mg/dL (ref 2.5–7.1)

## 2013-08-22 ENCOUNTER — Encounter: Payer: Self-pay | Admitting: *Deleted

## 2013-08-22 NOTE — Telephone Encounter (Signed)
[name] [name] called me back, restates her mom has no recollection of being shocked. Staff at assisted living states pt has not reported being shocked. Device episode shows arrhythmia was diagnosed in 7.75sec, defibrillated pt within 34 sec.

## 2013-08-27 ENCOUNTER — Ambulatory Visit (INDEPENDENT_AMBULATORY_CARE_PROVIDER_SITE_OTHER): Payer: Medicare HMO | Admitting: *Deleted

## 2013-08-27 DIAGNOSIS — I509 Heart failure, unspecified: Secondary | ICD-10-CM

## 2013-08-27 DIAGNOSIS — I5022 Chronic systolic (congestive) heart failure: Secondary | ICD-10-CM

## 2013-08-31 LAB — MDC_IDC_ENUM_SESS_TYPE_REMOTE
Battery Remaining Longevity: 60 mo
Battery Remaining Percentage: 56 %
Brady Statistic RA Percent Paced: 1 %
Brady Statistic RV Percent Paced: 1 %
HIGH POWER IMPEDANCE MEASURED VALUE: 40 Ohm
Implantable Pulse Generator Serial Number: 752544
Lead Channel Impedance Value: 530 Ohm
Lead Channel Impedance Value: 580 Ohm
Lead Channel Pacing Threshold Amplitude: 0.75 V
Lead Channel Pacing Threshold Pulse Width: 0.5 ms
Lead Channel Pacing Threshold Pulse Width: 0.5 ms
Lead Channel Sensing Intrinsic Amplitude: 11.7 mV
Lead Channel Sensing Intrinsic Amplitude: 2.2 mV
Lead Channel Setting Pacing Amplitude: 2 V
Lead Channel Setting Pacing Amplitude: 2 V
Lead Channel Setting Pacing Pulse Width: 0.5 ms
MDC IDC MSMT BATTERY VOLTAGE: 2.9 V
MDC IDC MSMT LEADCHNL RV PACING THRESHOLD AMPLITUDE: 0.625 V
MDC IDC SESS DTM: 20150420075216
MDC IDC SET LEADCHNL RV SENSING SENSITIVITY: 0.5 mV
MDC IDC SET ZONE DETECTION INTERVAL: 375 ms
MDC IDC SET ZONE DETECTION INTERVAL: 425 ms
MDC IDC STAT BRADY AP VP PERCENT: 1 %
MDC IDC STAT BRADY AP VS PERCENT: 1 %
MDC IDC STAT BRADY AS VP PERCENT: 1 %
MDC IDC STAT BRADY AS VS PERCENT: 99 %
Zone Setting Detection Interval: 300 ms

## 2013-09-03 ENCOUNTER — Telehealth: Payer: Self-pay

## 2013-09-03 NOTE — Telephone Encounter (Signed)
Physician order request was received from [facility] dated 08/30/2013. Order was faxed back to [facility] and sent for scanning.  Dr.Reeds orders:   1.) Push fluids x 24/h 2.) Obtain UA c+s if SX persist 3.) VS daily x 72/h and notify MD/NP if fever 4.) Obtain CBCD, BMP today  Signed/Dated 09/03/2013  (FYI)

## 2013-09-07 ENCOUNTER — Telehealth: Payer: Self-pay | Admitting: *Deleted

## 2013-09-07 ENCOUNTER — Ambulatory Visit (INDEPENDENT_AMBULATORY_CARE_PROVIDER_SITE_OTHER): Payer: Medicare HMO | Admitting: *Deleted

## 2013-09-07 DIAGNOSIS — I428 Other cardiomyopathies: Secondary | ICD-10-CM

## 2013-09-07 LAB — MDC_IDC_ENUM_SESS_TYPE_INCLINIC
Battery Remaining Longevity: 63.6 mo
Brady Statistic RA Percent Paced: 0.73 %
HighPow Impedance: 42.4688
Lead Channel Pacing Threshold Amplitude: 0.5 V
Lead Channel Pacing Threshold Amplitude: 0.625 V
Lead Channel Pacing Threshold Pulse Width: 0.5 ms
Lead Channel Setting Pacing Amplitude: 2 V
Lead Channel Setting Pacing Amplitude: 2 V
Lead Channel Setting Pacing Pulse Width: 0.5 ms
MDC IDC MSMT LEADCHNL RA PACING THRESHOLD PULSEWIDTH: 0.5 ms
MDC IDC MSMT LEADCHNL RA SENSING INTR AMPL: 2.2 mV
MDC IDC MSMT LEADCHNL RV SENSING INTR AMPL: 11.7 mV
MDC IDC PG SERIAL: 752544
MDC IDC SESS DTM: 20150501151643
MDC IDC SET LEADCHNL RV SENSING SENSITIVITY: 0.5 mV
MDC IDC STAT BRADY RV PERCENT PACED: 0.36 %
Zone Setting Detection Interval: 300 ms

## 2013-09-07 NOTE — Progress Notes (Signed)
Device reprogrammed for [place] and now a 1 zone device per Dr. Caryl Comes.  ROV in July with Dr. Caryl Comes.

## 2013-09-07 NOTE — Telephone Encounter (Signed)
Patient's daughter called to get results on her mother's urine and culture. I informed her that Dr. Mariea Clonts had not seen them yet, but I would see if the NP on-call would look at it when she comes in this evening.

## 2013-09-10 ENCOUNTER — Encounter: Payer: Self-pay | Admitting: Internal Medicine

## 2013-09-10 ENCOUNTER — Ambulatory Visit (INDEPENDENT_AMBULATORY_CARE_PROVIDER_SITE_OTHER): Payer: Medicare HMO | Admitting: Internal Medicine

## 2013-09-10 VITALS — BP 118/72 | HR 86 | Temp 97.3°F | Wt 136.0 lb

## 2013-09-10 DIAGNOSIS — R109 Unspecified abdominal pain: Secondary | ICD-10-CM

## 2013-09-10 NOTE — Patient Instructions (Signed)
6 glasses of water per day to help prevent urinary tract infections and keep your kidneys working.

## 2013-09-10 NOTE — Progress Notes (Signed)
Patient ID: [name] [name], female   DOB: 28-Oct-1924, 80 y.o.   MRN: [id number]   Location:  [facility] / [facility]   Allergies  Allergen Reactions  . Ace Inhibitors Cough  . Requip [Ropinirole Hcl] Other (See Comments)    confusion    Chief Complaint  Patient presents with  . Abdominal Pain    Patient with abdominal pain x 2 weeks when urinating. Patient 's states pain is all the time "heavy pressure"  . GI Problem    Funny sound " Sounds like a lot of water" after eating   . Results    Discuss Genetic testing results     HPI: Patient is a 80 y.o. white female seen in the office today for acute visit with abdominal pressure.   Heavy pressure with urination.  Not dysuria, urgency, frequency of any difference from before.  Not drinking enough water.    When drinks liquid, feels a gurgling up in her midchest like she drank a lot of water.  Is not eating a whole lot.  Last trip on easter Sunday, had been on catheter after rib fxs.  Had a problem with the catheter at that time--was not working and continues to pass urine despite catheter being in place.  Bowels are moving.    Review of Systems:  Review of Systems  Constitutional: Negative for fever.  Eyes: Positive for blurred vision.       Wearing old glasses  Cardiovascular: Positive for chest pain.       Gurgling in her michest  Gastrointestinal: Negative for constipation, blood in stool and melena.       Poor po intake  Genitourinary: Negative for dysuria, urgency and frequency.       Heavy pressure with urination  Musculoskeletal: Positive for back pain. Negative for falls.       Rib pain  Skin: Negative for rash.  Neurological: Negative for dizziness, loss of consciousness and headaches.  Psychiatric/Behavioral: Positive for memory loss.     Past Medical History  Diagnosis Date  . Atrial fibrillation   . BRONCHITIS, CHRONIC   . CHEST PAIN   . HYPOKALEMIA   . OBSTRUCTIVE SLEEP  APNEA   . Palpitations   . SYSTOLIC HEART FAILURE, CHRONIC   . Depression   . CPAP (continuous positive airway pressure) dependence   . ACE-inhibitor cough   . Cardiac defibrillator in situ   . Chronic renal insufficiency   . GERD (gastroesophageal reflux disease)   . Hypothyroidism   . Hematuria   . Osteoarthritis (arthritis due to wear and tear of joints)   . Colon polyps   . Chronic rhinitis   . RLS (restless legs syndrome)   . Chronic back pain   . Arthritis   . Glaucoma   . Sleep apnea   . CHF (congestive heart failure)   . Dementia   . H/O colonoscopy 2005    Past Surgical History  Procedure Laterality Date  . Back surgery  1973  . Cardiac defibrillator placement  2005     [place] [phone]  . Skin cancer excision    . Cardiac catheterization  07/29/2003     Congestive heart failure, questionable etiology.  Increased LVEDP of 75mmHg on cath --  Nonobstructive coronary disease --         . Insert / replace / remove pacemaker  01/10/2004    Aborted biventricular implantable cardiac defibrillator implantation, complicated by probable pneumothorax  --  Deboraha Sprang, M.D.  . Atrial cardiac pacemaker insertion  01/15/2004    St. Jude 1581 65 cm dual coil active fixation defibrillator lead, serial number [id number] --  St. Jude Atlas +HF model V340CRT defibrillator, serial [id number]. -- Biventricular implantable cardioverter-defibrillator implantation, contrast venography and intraoperative defibrillation threshold testing -- SURGEON:  Deboraha Sprang, M.D.  . Cardiac pacemaker placement  03/17/2009    generator change  --  Un[employer] St. Jude ICD model [phone], serial number [id number]  -- Deboraha Sprang, MD, [facility]   . Femur fracture surgery  2010  . Esophagogastroduodenoscopy  04/13/2011    Procedure: ESOPHAGOGASTRODUODENOSCOPY (EGD);  Surgeon: Zenovia Jarred, MD;  Location: [facility];  Service: Gastroenterology;  Laterality: N/A;  may need dilation    Social History:   reports  that she quit smoking about 10 years ago. Her smoking use included Cigarettes. She smoked 1.00 pack per day. She has never used smokeless tobacco. She reports that she does not drink alcohol or use illicit drugs.  Family History  Problem Relation Age of Onset  . Heart disease Mother   . Colon cancer Mother   . Colon polyps Mother   . Kidney disease Mother   . Emphysema Father   . Liver disease Brother   . Kidney disease Maternal Grandmother     Medications: Patient's Medications  New Prescriptions   No medications on file  Previous Medications   ALLOPURINOL (ZYLOPRIM) 100 MG TABLET    Take 1 tablet (100 mg total) by mouth daily. Take 1 tablet by mouth daily for Gout   ASCORBIC ACID (VITAMIN C) 500 MG TABLET    Take 500 mg by mouth daily.     ASPIRIN EC 81 MG TABLET    Take 1 tablet (81 mg total) by mouth daily.   CALCIUM CARBONATE (OS-CAL - DOSED IN MG OF ELEMENTAL CALCIUM) 1250 MG TABLET    Take 1 tablet by mouth 2 (two) times daily with a meal.   CARVEDILOL (COREG) 6.25 MG TABLET    Take 1 tablet (6.25 mg total) by mouth 2 (two) times daily with a meal. Take 1 tablet m=by mouth 2 times daily with a meal for BP   CRANBERRY-VITAMIN C-INULIN (UTI-STAT) LIQD    30 mLs. Taking 30 ml by mouth every morning   DIPTHERIA-TETANUS TOXOIDS (DECAVAC) 2-2 LF/0.5ML INJECTION    Inject 0.5 mLs into the muscle once.   ERGOCALCIFEROL (VITAMIN D2) 50000 UNITS CAPSULE    Take 50,000 Units by mouth once a week. On wednesdays   FENTANYL (DURAGESIC - DOSED MCG/HR) 25 MCG/HR PATCH    Place 1 patch (25 mcg total) onto the skin every 3 (three) days. Place 1 patch onto the skin every 3 days for pain   FUROSEMIDE (LASIX) 40 MG TABLET    Take 1 tablet (40 mg total) by mouth 2 (two) times daily. Take 1 tablet by mouth 2 times daily for Edema   HYDRALAZINE (APRESOLINE) 10 MG TABLET    Take 1 tablet (10 mg total) by mouth 3 (three) times daily. Take 1 tablet by mouth 3 times daily, breakfast, 2:00 pm and bedtime for  BP   HYDROCODONE-ACETAMINOPHEN (NORCO/VICODIN) 5-325 MG PER TABLET    Take 1 tablet by mouth every 8 (eight) hours as needed for moderate pain or severe pain.   ISOSORBIDE MONONITRATE (IMDUR) 30 MG 24 HR TABLET    Take 1 tablet (30 mg total) by mouth daily. Take 1 tablet by mouth daily for BP  LEVOTHYROXINE (SYNTHROID, LEVOTHROID) 88 MCG TABLET    Take 1 tab;et by mouth for Hypothyroidism   NEOMYCIN-BACITRACIN-POLYMYXIN (NEOSPORIN) OINTMENT    Apply 1 application topically daily as needed for wound care. apply to eye   OXYCODONE (OXY IR/ROXICODONE) 5 MG IMMEDIATE RELEASE TABLET    Take 2 tablets (10 mg total) by mouth every 4 (four) hours as needed for severe pain.   PANTOPRAZOLE (PROTONIX) 40 MG TABLET    Take 1 tablet (40 mg total) by mouth 2 (two) times daily before a meal. Take 1 tablet by mouth 2 times daily before a meal for GERD   POLYETHYLENE GLYCOL (MIRALAX / GLYCOLAX) PACKET    Take 17 g by mouth daily.   POLYVINYL ALCOHOL (LIQUIFILM TEARS) 1.4 % OPHTHALMIC SOLUTION    Place 1 drop into both eyes 3 (three) times daily.   POTASSIUM CHLORIDE SA (K-DUR,KLOR-CON) 20 MEQ TABLET    Take 2 tablets (40 mEq total) by mouth 2 (two) times daily. Take 2 tablets by mouth 2 times daily for hypokalemia   SERTRALINE (ZOLOFT) 100 MG TABLET    Take 1 tablet (100 mg total) by mouth daily. Take 1 tablet by mouth daily for Depression   WHITE PETROLATUM-MINERAL OIL (ARTIFICIAL TEARS) OINT OPHTHALMIC OINTMENT    Place 1 application into both eyes at bedtime.   ZOSTER VACCINE LIVE, PF, (ZOSTAVAX) 81017 UNT/0.65ML INJECTION    Inject 19,400 Units into the skin once.  Modified Medications   No medications on file  Discontinued Medications   No medications on file     Physical Exam: Filed Vitals:   09/10/13 1528  BP: 118/72  Pulse: 86  Temp: 97.3 F (36.3 C)  TempSrc: Oral  Weight: 136 lb (61.689 kg)  SpO2: 90%  Physical Exam  Constitutional: No distress.  Frail white female  Eyes:  Wears glasses    Cardiovascular: Normal rate, regular rhythm, normal heart sounds and intact distal pulses.   Pulmonary/Chest: Effort normal and breath sounds normal. No respiratory distress.  Abdominal: Soft. Bowel sounds are normal. She exhibits no distension and no mass. There is no tenderness.  Musculoskeletal:  Decreased ROM of hips bilaterally  Neurological: She is alert.  Oriented to person only  Skin: Skin is warm and dry.  Psychiatric: She has a normal mood and affect.    Labs reviewed: Basic Metabolic Panel:  Recent Labs  07/29/13 1159 08/12/13 2151 08/20/13 1101  NA 139 138 141  K 4.9 4.0 4.1  CL 99 98 98  CO2 26 24 24   GLUCOSE 109* 119* 109*  BUN 38* 18 18  CREATININE 1.67* 1.32* 1.54*  CALCIUM 10.3 9.8 10.5*  TSH  --   --  2.370   Liver Function Tests:  Recent Labs  07/29/13 1159 08/12/13 2151 08/20/13 1101  AST 24 19 24   ALT 11 9 11   ALKPHOS 133* 150* 147*  BILITOT 0.7 0.8 0.7  PROT 7.5 7.4 6.8  ALBUMIN 3.5 3.6  --    CBC:  Recent Labs  07/17/13 1145  07/29/13 1159 08/12/13 2151 08/20/13 1101  WBC 6.6  < > 11.8* 13.8* 8.9  NEUTROABS 5.0  --   --  11.2* 6.3  HGB 10.8*  < > 12.7 11.8* 11.2  HCT 33.0*  < > 38.5 35.7* 34.9  MCV 97.3  < > 96.5 95.7 97  PLT 117*  < > 136* 128* 127*  < > = values in this interval not displayed.   Assessment/Plan 1. Abdominal pressure - suspect  she has urinary retention--had previously during hospitalization and had to use foley -encouraged adequate hydration with water 6-8 glasses per day - Ambulatory referral to Urology for ultrasound and possibly cystoscopy if needed  Labs/tests ordered:     Orders Placed This Encounter  Procedures  . Ambulatory referral to Urology    Referral Priority:  Routine    Referral Type:  Consultation    Referral Reason:  Specialty Services Required    Requested Specialty:  Urology    Number of Visits Requested:  1    Next appt:  Keep as scheduled

## 2013-09-10 NOTE — Progress Notes (Signed)
ICD remote with ICM 

## 2013-09-25 ENCOUNTER — Encounter: Payer: Self-pay | Admitting: Internal Medicine

## 2013-09-28 ENCOUNTER — Encounter: Payer: Self-pay | Admitting: Internal Medicine

## 2013-09-28 ENCOUNTER — Ambulatory Visit (INDEPENDENT_AMBULATORY_CARE_PROVIDER_SITE_OTHER): Payer: Medicare HMO | Admitting: Internal Medicine

## 2013-09-28 VITALS — BP 124/78 | HR 59 | Temp 97.6°F | Resp 10 | Ht 61.0 in | Wt 136.0 lb

## 2013-09-28 DIAGNOSIS — F0150 Vascular dementia without behavioral disturbance: Secondary | ICD-10-CM

## 2013-09-28 DIAGNOSIS — N183 Chronic kidney disease, stage 3 unspecified: Secondary | ICD-10-CM

## 2013-09-28 DIAGNOSIS — I4891 Unspecified atrial fibrillation: Secondary | ICD-10-CM

## 2013-09-28 DIAGNOSIS — R0781 Pleurodynia: Secondary | ICD-10-CM

## 2013-09-28 DIAGNOSIS — F0390 Unspecified dementia without behavioral disturbance: Secondary | ICD-10-CM

## 2013-09-28 DIAGNOSIS — I509 Heart failure, unspecified: Secondary | ICD-10-CM

## 2013-09-28 DIAGNOSIS — I5022 Chronic systolic (congestive) heart failure: Secondary | ICD-10-CM

## 2013-09-28 DIAGNOSIS — Z5181 Encounter for therapeutic drug level monitoring: Secondary | ICD-10-CM

## 2013-09-28 DIAGNOSIS — R079 Chest pain, unspecified: Secondary | ICD-10-CM

## 2013-09-28 DIAGNOSIS — E039 Hypothyroidism, unspecified: Secondary | ICD-10-CM | POA: Insufficient documentation

## 2013-09-28 MED ORDER — FENTANYL 25 MCG/HR TD PT72: 25.0000 ug | MEDICATED_PATCH | TRANSDERMAL | Status: DC

## 2013-09-28 NOTE — Progress Notes (Signed)
Patient ID: [name] [name], female   DOB: 12/04/1924, 80 y.o.   MRN: [id number]   Location:  [facility]  Code Status: to bring MOST back after they discuss it at home, but patient says she would want DNR code status at this time  Allergies  Allergen Reactions  . Ace Inhibitors Cough  . Requip [Ropinirole Hcl] Other (See Comments)    confusion    Chief Complaint  Patient presents with  . Annual Exam    Yearly exam, no more pap smears, EKG is up to date (No CP)   . Orders    Order request to D/C wound care (wound is resolved) per [facility] [employer]     HPI: Patient is a 80 y.o. white female seen in the office today for annual exam.  MMSE score 16/30 missing on orientation to time, place, attention, one on recall, reading, writing and copying.    Her wound has resolved.  Only using tylenol for pain recently.  Has not taken any hydrocodone or oxycodone for a while.    Most of the time she sleeps well at night.    Has cardiology appt in June and defibrillation review in July.  At one point, she had been shocked and it had to be adjusted so it didn't shock her easily (early May this year).   She has never had an abnormal mammogram.  We discussed that probably nothing would be done if a mass was identified anyway at this point--question of whether she would survive surgery.    Cscope not indicated at 80 yo.    Has not had a zostavax.  Thinks she had a mild case years ago.  [facility]    Review of Systems:  Review of Systems  Constitutional: Positive for weight loss. Negative for fever, chills and malaise/fatigue.  Eyes: Positive for blurred vision.       Needs new glasses, dtr to arrange ophtho f/u  Respiratory: Negative for shortness of breath.   Cardiovascular: Negative for chest pain, palpitations and leg swelling.  Gastrointestinal: Negative for abdominal pain.  Genitourinary:       Urinary  incontinence  Musculoskeletal: Positive for falls.       None since last appt though  Skin: Negative for rash.  Neurological: Positive for weakness. Negative for dizziness, loss of consciousness and headaches.  Endo/Heme/Allergies: Bruises/bleeds easily.  Psychiatric/Behavioral: Positive for depression and memory loss.       Controlled with zoloft low dose for years;  mmse done today     Past Medical History  Diagnosis Date  . Atrial fibrillation   . BRONCHITIS, CHRONIC   . CHEST PAIN   . HYPOKALEMIA   . OBSTRUCTIVE SLEEP APNEA   . Palpitations   . SYSTOLIC HEART FAILURE, CHRONIC   . Depression   . CPAP (continuous positive airway pressure) dependence   . ACE-inhibitor cough   . Cardiac defibrillator in situ   . Chronic renal insufficiency   . GERD (gastroesophageal reflux disease)   . Hypothyroidism   . Hematuria   . Osteoarthritis (arthritis due to wear and tear of joints)   . Colon polyps   . Chronic rhinitis   . RLS (restless legs syndrome)   . Chronic back pain   . Arthritis   . Glaucoma   . Sleep apnea   . CHF (congestive heart failure)   . Dementia   . H/O colonoscopy 2005  Past Surgical History  Procedure Laterality Date  . Back surgery  1973  . Cardiac defibrillator placement  2005     [place] [phone]  . Skin cancer excision    . Cardiac catheterization  07/29/2003     Congestive heart failure, questionable etiology.  Increased LVEDP of 76mmHg on cath --  Nonobstructive coronary disease --         . Insert / replace / remove pacemaker  01/10/2004    Aborted biventricular implantable cardiac defibrillator implantation, complicated by probable pneumothorax  --  Deboraha Sprang, M.D.  . Atrial cardiac pacemaker insertion  01/15/2004    St. Jude 1581 65 cm dual coil active fixation defibrillator lead, serial number [id number] --  St. Jude Atlas +HF model V340CRT defibrillator, serial [id number]. -- Biventricular implantable cardioverter-defibrillator  implantation, contrast venography and intraoperative defibrillation threshold testing -- SURGEON:  Deboraha Sprang, M.D.  . Cardiac pacemaker placement  03/17/2009    generator change  --  [employer] ICD model [phone], serial number [id number]  -- Deboraha Sprang, MD, [facility]   . Femur fracture surgery  2010  . Esophagogastroduodenoscopy  04/13/2011    Procedure: ESOPHAGOGASTRODUODENOSCOPY (EGD);  Surgeon: Zenovia Jarred, MD;  Location: [facility];  Service: Gastroenterology;  Laterality: N/A;  may need dilation    Social History:   reports that she quit smoking about 10 years ago. Her smoking use included Cigarettes. She smoked 1.00 pack per day. She has never used smokeless tobacco. She reports that she does not drink alcohol or use illicit drugs.  Family History  Problem Relation Age of Onset  . Heart disease Mother   . Colon cancer Mother   . Colon polyps Mother   . Kidney disease Mother   . Emphysema Father   . Liver disease Brother   . Kidney disease Maternal Grandmother     Medications: Patient's Medications  New Prescriptions   No medications on file  Previous Medications   ALLOPURINOL (ZYLOPRIM) 100 MG TABLET    Take 1 tablet (100 mg total) by mouth daily. Take 1 tablet by mouth daily for Gout   ASCORBIC ACID (VITAMIN C) 500 MG TABLET    Take 500 mg by mouth daily.     ASPIRIN EC 81 MG TABLET    Take 1 tablet (81 mg total) by mouth daily.   CALCIUM CARBONATE (OS-CAL - DOSED IN MG OF ELEMENTAL CALCIUM) 1250 MG TABLET    Take 1 tablet by mouth 2 (two) times daily with a meal.   CARVEDILOL (COREG) 6.25 MG TABLET    Take 1 tablet (6.25 mg total) by mouth 2 (two) times daily with a meal. Take 1 tablet m=by mouth 2 times daily with a meal for BP   CHOLECALCIFEROL (VITAMIN D3) 50000 UNITS CAPS    Take by mouth. 1 by mouth weekly on Wednesdays   CIPROFLOXACIN (CIPRO) 250 MG TABLET    Take 250 mg by mouth 2 (two) times daily. X 7 days per Urologist   CRANBERRY-VITAMIN C-INULIN (UTI-STAT)  LIQD    30 mLs. Taking 30 ml by mouth every morning   DIPTHERIA-TETANUS TOXOIDS (DECAVAC) 2-2 LF/0.5ML INJECTION    Inject 0.5 mLs into the muscle once.   FENTANYL (DURAGESIC - DOSED MCG/HR) 25 MCG/HR PATCH    Place 1 patch (25 mcg total) onto the skin every 3 (three) days. Place 1 patch onto the skin every 3 days for pain   FUROSEMIDE (LASIX) 40 MG TABLET  Take 1 tablet (40 mg total) by mouth 2 (two) times daily. Take 1 tablet by mouth 2 times daily for Edema   HYDRALAZINE (APRESOLINE) 10 MG TABLET    Take 1 tablet (10 mg total) by mouth 3 (three) times daily. Take 1 tablet by mouth 3 times daily, breakfast, 2:00 pm and bedtime for BP   HYDROCODONE-ACETAMINOPHEN (NORCO/VICODIN) 5-325 MG PER TABLET    Take 1 tablet by mouth every 8 (eight) hours as needed for moderate pain or severe pain.   ISOSORBIDE MONONITRATE (IMDUR) 30 MG 24 HR TABLET    Take 1 tablet (30 mg total) by mouth daily. Take 1 tablet by mouth daily for BP   LEVOTHYROXINE (SYNTHROID, LEVOTHROID) 88 MCG TABLET    Take 1 tab;et by mouth for Hypothyroidism   NEOMYCIN-BACITRACIN-POLYMYXIN (NEOSPORIN) OINTMENT    Apply 1 application topically daily as needed for wound care. apply to eye   OXYCODONE (OXY IR/ROXICODONE) 5 MG IMMEDIATE RELEASE TABLET    Take 2 tablets (10 mg total) by mouth every 4 (four) hours as needed for severe pain.   PANTOPRAZOLE (PROTONIX) 40 MG TABLET    Take 1 tablet (40 mg total) by mouth 2 (two) times daily before a meal. Take 1 tablet by mouth 2 times daily before a meal for GERD   POLYETHYLENE GLYCOL (MIRALAX / GLYCOLAX) PACKET    Take 17 g by mouth daily.   POLYVINYL ALCOHOL (LIQUIFILM TEARS) 1.4 % OPHTHALMIC SOLUTION    Place 1 drop into both eyes 3 (three) times daily.   POTASSIUM CHLORIDE SA (K-DUR,KLOR-CON) 20 MEQ TABLET    Take 2 tablets (40 mEq total) by mouth 2 (two) times daily. Take 2 tablets by mouth 2 times daily for hypokalemia   SERTRALINE (ZOLOFT) 100 MG TABLET    Take 1 tablet (100 mg total) by  mouth daily. Take 1 tablet by mouth daily for Depression   WHITE PETROLATUM-MINERAL OIL (ARTIFICIAL TEARS) OINT OPHTHALMIC OINTMENT    Place 1 application into both eyes at bedtime.   ZOSTER VACCINE LIVE, PF, (ZOSTAVAX) 31540 UNT/0.65ML INJECTION    Inject 19,400 Units into the skin once.  Modified Medications   No medications on file  Discontinued Medications   ERGOCALCIFEROL (VITAMIN D2) 50000 UNITS CAPSULE    Take 50,000 Units by mouth once a week. On wednesdays     Physical Exam: Filed Vitals:   09/28/13 0902  BP: 124/78  Pulse: 59  Temp: 97.6 F (36.4 C)  TempSrc: Oral  Resp: 10  Height: 5\' 1"  (1.549 m)  Weight: 136 lb (61.689 kg)  SpO2: 98%  Physical Exam  Constitutional: No distress.  Frail white female seated in wheelchair;  Requires one person assist for transfers   HENT:  Head: Normocephalic and atraumatic.  Right Ear: External ear normal.  Left Ear: External ear normal.  Nose: Nose normal.  Mouth/Throat: Oropharynx is clear and moist. No oropharyngeal exudate.  Eyes: Conjunctivae and EOM are normal. Pupils are equal, round, and reactive to light.  glasses  Neck: Normal range of motion. Neck supple. No JVD present. No tracheal deviation present. No thyromegaly present.  Cardiovascular: Normal rate, regular rhythm, normal heart sounds and intact distal pulses.   Pulmonary/Chest: Effort normal and breath sounds normal. No respiratory distress. Right breast exhibits no inverted nipple, no mass, no nipple discharge, no skin change and no tenderness. Left breast exhibits no inverted nipple, no mass, no nipple discharge, no skin change and no tenderness. Breasts are symmetrical. There is no  breast swelling.  Abdominal: Soft. Bowel sounds are normal. She exhibits no distension and no mass. There is no tenderness.  Genitourinary: No breast bleeding.  No pap/pelvic indicated at her age, refuses rectal exam  Lymphadenopathy:    She has no cervical adenopathy.  Neurological:  She is alert. No cranial nerve deficit.  Oriented to person only  Skin: Skin is warm and dry.  Psychiatric: She has a normal mood and affect.    Labs reviewed: Basic Metabolic Panel:  Recent Labs  07/29/13 1159 08/12/13 2151 08/20/13 1101  NA 139 138 141  K 4.9 4.0 4.1  CL 99 98 98  CO2 26 24 24   GLUCOSE 109* 119* 109*  BUN 38* 18 18  CREATININE 1.67* 1.32* 1.54*  CALCIUM 10.3 9.8 10.5*  TSH  --   --  2.370   Liver Function Tests:  Recent Labs  07/29/13 1159 08/12/13 2151 08/20/13 1101  AST 24 19 24   ALT 11 9 11   ALKPHOS 133* 150* 147*  BILITOT 0.7 0.8 0.7  PROT 7.5 7.4 6.8  ALBUMIN 3.5 3.6  --   CBC:  Recent Labs  07/17/13 1145  07/29/13 1159 08/12/13 2151 08/20/13 1101  WBC 6.6  < > 11.8* 13.8* 8.9  NEUTROABS 5.0  --   --  11.2* 6.3  HGB 10.8*  < > 12.7 11.8* 11.2  HCT 33.0*  < > 38.5 35.7* 34.9  MCV 97.3  < > 96.5 95.7 97  PLT 117*  < > 136* 128* 127*  < > = values in this interval not displayed.   Assessment/Plan 1. Mixed vascular and neurodegenerative dementia -moderate to severe at this time with 16/30 score on mmse and functional dependence except feeding, is verbal and sociable, but quiet and HOH -dependent on one person at least for transfers -gets around with wheelchair primarily at this point  2. Chronic systolic congestive heart failure -cont current lasix 40mg  and potassium supplement -no signs of acute exacerbation at this time  3. Atrial fibrillation -follows with cardiology -has pacer/defibrillator in place--pt and her daughter are considering if they want the defibrillator portion turned off due to her progressing dementia  4. Chronic kidney disease (CKD), stage III (moderate) -cont to monitor renal function due to poor po intake  5. Hypothyroidism -cont current synthroid  6.  Encounter for therapeutic drug monitoring -discussed genetic testing results -pt is tolerating her zoloft and has not had any clear interactions with  other medications -not using oxycodone so will d/c, keep hydrocodone as needed for her and use tylenol if needs for pain (has not even used it much lately per pt and daughter)  Has not yet gotten her zostavax, but her daughter has the prescription at home--reviewed [facility] due to [facility]are part D filing  Labs/tests ordered:  None today  Next appt:  2 mos

## 2013-09-28 NOTE — Progress Notes (Signed)
Failed clock drawing  

## 2013-10-15 ENCOUNTER — Encounter: Payer: Self-pay | Admitting: Internal Medicine

## 2013-10-16 ENCOUNTER — Encounter: Payer: Self-pay | Admitting: Internal Medicine

## 2013-10-23 ENCOUNTER — Other Ambulatory Visit: Payer: Self-pay | Admitting: *Deleted

## 2013-10-23 DIAGNOSIS — I509 Heart failure, unspecified: Secondary | ICD-10-CM

## 2013-10-23 DIAGNOSIS — E876 Hypokalemia: Secondary | ICD-10-CM

## 2013-10-23 DIAGNOSIS — I4891 Unspecified atrial fibrillation: Secondary | ICD-10-CM

## 2013-10-23 DIAGNOSIS — R079 Chest pain, unspecified: Secondary | ICD-10-CM

## 2013-10-26 ENCOUNTER — Encounter: Payer: Self-pay | Admitting: *Deleted

## 2013-11-01 ENCOUNTER — Encounter: Payer: Self-pay | Admitting: Internal Medicine

## 2013-11-13 ENCOUNTER — Encounter: Payer: Medicare Other | Admitting: Internal Medicine

## 2013-11-21 ENCOUNTER — Telehealth: Payer: Self-pay | Admitting: Nurse Practitioner

## 2013-11-21 NOTE — Telephone Encounter (Signed)
Perhaps we can schedule her hydrocodone at bedtime.  Has anyone gotten any messages about this patient other than Janett Billow?

## 2013-11-21 NOTE — Telephone Encounter (Signed)
Patient's son in law called while I was on-call and was very concerned regarding pt due to the fact pain medication was not timed right and pt is not sleeping. Apparently [facility] has attempted to contact office without return message and wife without return call and he was wanting someone to call him or his wife (pts daughter) back. He feels like something should be called in for sleep because she has not been able to sleep and is in a lot of pain. I recommended melatonin 3 mg at night as needed and to follow up in the morning. If someone could please give family member/[facility] a call back they would appreciate it.

## 2013-11-22 ENCOUNTER — Telehealth: Payer: Self-pay | Admitting: *Deleted

## 2013-11-22 ENCOUNTER — Other Ambulatory Visit: Payer: Self-pay | Admitting: Internal Medicine

## 2013-11-22 DIAGNOSIS — M25569 Pain in unspecified knee: Secondary | ICD-10-CM

## 2013-11-22 MED ORDER — QUETIAPINE FUMARATE 25 MG PO TABS: 25.0000 mg | ORAL_TABLET | Freq: Every day | ORAL | Status: DC

## 2013-11-22 MED ORDER — HYDROCODONE-ACETAMINOPHEN 5-325 MG PO TABS: 1.0000 | ORAL_TABLET | Freq: Four times a day (QID) | ORAL | Status: DC

## 2013-11-22 NOTE — Telephone Encounter (Signed)
Daughter, Nevin Bloodgood, called and stated that she wants you to call her. Cannot get an appointment with you so she wants to speak with you concerning patient. States that she is in constant state of anxiety, screaming and leg pain. Would send her to the ER but daughter states that she doesn't think that is in the patient's best interest. Patient is also getting obsessed that someone is stealing her cloths and wearing them. Daughter states that she sits there and watch her when she sleeps and states that her leg does jump and spasms. Daughter again, wants you to return her call. # [phone]

## 2013-11-22 NOTE — Telephone Encounter (Signed)
Spoke with Nevin Bloodgood.  Her mom has declined.  Will be seen in 2 wks.  Nevin Bloodgood isn't sure if it is mental or physical.  Having trouble sleeping and being anxious.  Sometimes dazed and sometimes yelling and screaming out in pain.  Also getting obsessed that other people are wearing her clothes and returning them.  She is rearranging her clothes in closet so people won't take her things.  (paranoid delusions)  She did not have a UTI when sample done.  Her daughter mentions she has also had muscle spasms.  Did discuss how meds increase fall risk, but she agrees these will be needed at this point.    Plan discussed:  Cbc, cmp D/c prn hydrocodone Schedule hydrocodone 5/325mg  po q 6 hrs for pain (will fax to [facility]) Begin seroquel 25mg  po qhs for paranoid delusions (will fax this to [facility] for her daughter to pick up)  All done before I left the office today.

## 2013-11-23 NOTE — Telephone Encounter (Signed)
Dr. Mariea Clonts spoke with patient's daughter

## 2013-12-03 ENCOUNTER — Telehealth: Payer: Self-pay | Admitting: *Deleted

## 2013-12-03 MED ORDER — RISPERIDONE 0.5 MG PO TABS: ORAL_TABLET | ORAL | Status: DC

## 2013-12-03 NOTE — Telephone Encounter (Signed)
Patient daughter,Paula, called in and stated that patient is throwing glasses and very agitated and wants something called in for her. Told her to have [employer] to fax an order over to [place]. Received the order and they want a PRN order for a medication for agitation. And daughter wants it called into [facility].   Per Dr. Reed---Risperdal 0.5mg  One tablet every 6 hours as needed for agitation.  Called in Rx into [facility] and notified daughter and faxed order to [employer].

## 2013-12-06 ENCOUNTER — Encounter: Payer: Self-pay | Admitting: Internal Medicine

## 2013-12-06 ENCOUNTER — Ambulatory Visit (INDEPENDENT_AMBULATORY_CARE_PROVIDER_SITE_OTHER): Payer: Medicare HMO | Admitting: Internal Medicine

## 2013-12-06 VITALS — BP 110/60 | HR 82 | Temp 99.4°F | Resp 10 | Ht 61.0 in | Wt 119.0 lb

## 2013-12-06 DIAGNOSIS — F0281 Dementia in other diseases classified elsewhere with behavioral disturbance: Secondary | ICD-10-CM

## 2013-12-06 DIAGNOSIS — R410 Disorientation, unspecified: Secondary | ICD-10-CM

## 2013-12-06 DIAGNOSIS — R300 Dysuria: Secondary | ICD-10-CM

## 2013-12-06 DIAGNOSIS — F01518 Vascular dementia, unspecified severity, with other behavioral disturbance: Secondary | ICD-10-CM

## 2013-12-06 DIAGNOSIS — E038 Other specified hypothyroidism: Secondary | ICD-10-CM

## 2013-12-06 DIAGNOSIS — N183 Chronic kidney disease, stage 3 unspecified: Secondary | ICD-10-CM

## 2013-12-06 DIAGNOSIS — F0150 Vascular dementia without behavioral disturbance: Secondary | ICD-10-CM

## 2013-12-06 DIAGNOSIS — I509 Heart failure, unspecified: Secondary | ICD-10-CM

## 2013-12-06 DIAGNOSIS — F05 Delirium due to known physiological condition: Principal | ICD-10-CM

## 2013-12-06 DIAGNOSIS — E034 Atrophy of thyroid (acquired): Secondary | ICD-10-CM | POA: Insufficient documentation

## 2013-12-06 DIAGNOSIS — I4891 Unspecified atrial fibrillation: Secondary | ICD-10-CM

## 2013-12-06 DIAGNOSIS — F02818 Dementia in other diseases classified elsewhere, unspecified severity, with other behavioral disturbance: Secondary | ICD-10-CM

## 2013-12-06 DIAGNOSIS — E0789 Other specified disorders of thyroid: Secondary | ICD-10-CM

## 2013-12-06 DIAGNOSIS — I5022 Chronic systolic (congestive) heart failure: Secondary | ICD-10-CM

## 2013-12-06 DIAGNOSIS — F0151 Vascular dementia with behavioral disturbance: Secondary | ICD-10-CM

## 2013-12-06 LAB — POCT URINALYSIS DIPSTICK
Bilirubin: NEGATIVE
Glucose, UA: NEGATIVE
Ketones, UA: NEGATIVE
Nitrite, UA: NEGATIVE
Protein, UA: NEGATIVE
Spec Grav, UA: 1.01
Urobilinogen, UA: 0.2
pH, UA: 6

## 2013-12-06 NOTE — Progress Notes (Signed)
Patient ID: [name] [name], female   DOB: 01-Nov-1924, 80 y.o.   MRN: [id number]   Location:  [facility]  Code Status: DNR  Allergies  Allergen Reactions  . Ace Inhibitors Cough  . Requip [Ropinirole Hcl] Other (See Comments)    confusion    Chief Complaint  Patient presents with  . Medical Management of Chronic Issues    2 month follow-up   . Dysuria    Burning when urinating- order was faxed from [facility] for u/a and culture and order was denied . Patient's daughter would like orders for u/a and culture today. Patient states she is unable to go now.   . Altered Mental Status    Personality changes, hallucinations, and behavior issues     HPI: Patient is a 80 y.o. white female seen in the office today for acute confusion.    Has good and bad days she says. No pain.  Is having dysuria since end of last week.  No clear increased frequency.   We started risperdal due to severe behaviors--yelling and screaming out about leg pain and scheduled her pain medications.  Not getting phone calls in the middle of the night.  Seems she is sleeping better at night.  Risperdal helped over the weekend, but Monday, she was kicking people out of her room, refusing her medications, mad with everybody.  PRN risperdal was added for paranoid psychosis--was saying people were holding her hostage and torturing her.  Was obsessing that people were taking and wearing her clothes.  Mon could not even remember living there before, forgets having had meals.  Picks at her skin and things on bed and floor that her daughter doesn't see.   Much more dramatic and extreme behavior.  She gets very worked up and upset.    Weight down from 136 to 117 lbs.  Her daughter has to remind her to eat sometimes, but other times eats well.  Loves peach milkshakes and chicken tenders.  Saw urology and had cystoscopy and had abx for UTI.  Has not followed up.  Review of  Systems:  Review of Systems  Constitutional: Positive for weight loss and malaise/fatigue.  HENT: Negative for congestion.   Eyes:       Glasses  Respiratory: Negative for shortness of breath.   Cardiovascular: Negative for chest pain.  Gastrointestinal: Negative for constipation.  Genitourinary: Positive for dysuria. Negative for urgency and frequency.  Musculoskeletal: Negative for falls.  Skin: Negative for rash.  Neurological: Negative for dizziness.  Endo/Heme/Allergies: Bruises/bleeds easily.  Psychiatric/Behavioral: Positive for depression, hallucinations and memory loss.     Past Medical History  Diagnosis Date  . Atrial fibrillation   . BRONCHITIS, CHRONIC   . CHEST PAIN   . HYPOKALEMIA   . OBSTRUCTIVE SLEEP APNEA   . Palpitations   . SYSTOLIC HEART FAILURE, CHRONIC   . Depression   . CPAP (continuous positive airway pressure) dependence   . ACE-inhibitor cough   . Cardiac defibrillator in situ   . Chronic renal insufficiency   . GERD (gastroesophageal reflux disease)   . Hypothyroidism   . Hematuria   . Osteoarthritis (arthritis due to wear and tear of joints)   . Colon polyps   . Chronic rhinitis   . RLS (restless legs syndrome)   . Chronic back pain   . Arthritis   . Glaucoma   . Sleep apnea   . CHF (congestive heart failure)   .  Dementia   . H/O colonoscopy 2005    Past Surgical History  Procedure Laterality Date  . Back surgery  1973  . Cardiac defibrillator placement  2005     [place] [phone]  . Skin cancer excision    . Cardiac catheterization  07/29/2003     Congestive heart failure, questionable etiology.  Increased LVEDP of 26mmHg on cath --  Nonobstructive coronary disease --         . Insert / replace / remove pacemaker  01/10/2004    Aborted biventricular implantable cardiac defibrillator implantation, complicated by probable pneumothorax  --  Deboraha Sprang, M.D.  . Atrial cardiac pacemaker insertion  01/15/2004    St. Jude  1581 65 cm dual coil active fixation defibrillator lead, serial number [id number] --  St. Jude Atlas +HF model V340CRT defibrillator, serial [id number]. -- Biventricular implantable cardioverter-defibrillator implantation, contrast venography and intraoperative defibrillation threshold testing -- SURGEON:  Deboraha Sprang, M.D.  . Cardiac pacemaker placement  03/17/2009    generator change  --  [place] ICD model [phone], serial number [id number]  -- Deboraha Sprang, MD, [facility]   . Femur fracture surgery  2010  . Esophagogastroduodenoscopy  04/13/2011    Procedure: ESOPHAGOGASTRODUODENOSCOPY (EGD);  Surgeon: Zenovia Jarred, MD;  Location: [facility];  Service: Gastroenterology;  Laterality: N/A;  may need dilation    Social History:   reports that she quit smoking about 10 years ago. Her smoking use included Cigarettes. She smoked 1.00 pack per day. She has never used smokeless tobacco. She reports that she does not drink alcohol or use illicit drugs.  Family History  Problem Relation Age of Onset  . Heart disease Mother   . Colon cancer Mother   . Colon polyps Mother   . Kidney disease Mother   . Emphysema Father   . Liver disease Brother   . Kidney disease Maternal Grandmother     Medications: Patient's Medications  New Prescriptions   No medications on file  Previous Medications   ALLOPURINOL (ZYLOPRIM) 100 MG TABLET    Take 1 tablet (100 mg total) by mouth daily. Take 1 tablet by mouth daily for Gout   ASCORBIC ACID (VITAMIN C) 500 MG TABLET    Take 500 mg by mouth daily.     ASPIRIN EC 81 MG TABLET    Take 1 tablet (81 mg total) by mouth daily.   CALCIUM CARB-CHOLECALCIFEROL (OYSTER SHELL CALCIUM + D PO)    Take by mouth. 1 by mouth four times daily with meals   CARVEDILOL (COREG) 6.25 MG TABLET    Take 1 tablet (6.25 mg total) by mouth 2 (two) times daily with a meal. Take 1 tablet m=by mouth 2 times daily with a meal for BP   CHOLECALCIFEROL (VITAMIN D3) 50000 UNITS CAPS    Take by mouth.  1 by mouth weekly on Wednesdays   CRANBERRY-VITAMIN C-INULIN (UTI-STAT) LIQD    30 mLs. Taking 30 ml by mouth every morning   FENTANYL (DURAGESIC - DOSED MCG/HR) 25 MCG/HR PATCH    Place 1 patch (25 mcg total) onto the skin every 3 (three) days. Place 1 patch onto the skin every 3 days for pain   FUROSEMIDE (LASIX) 40 MG TABLET    Take 1 tablet (40 mg total) by mouth 2 (two) times daily. Take 1 tablet by mouth 2 times daily for Edema   HYDRALAZINE (APRESOLINE) 10 MG TABLET    Take 1 tablet (10 mg  total) by mouth 3 (three) times daily. Take 1 tablet by mouth 3 times daily, breakfast, 2:00 pm and bedtime for BP   HYDROCODONE-ACETAMINOPHEN (NORCO/VICODIN) 5-325 MG PER TABLET    Take 1 tablet by mouth every 6 (six) hours. For leg pain   ISOSORBIDE MONONITRATE (IMDUR) 30 MG 24 HR TABLET    Take 1 tablet (30 mg total) by mouth daily. Take 1 tablet by mouth daily for BP   LEVOTHYROXINE (SYNTHROID, LEVOTHROID) 88 MCG TABLET    Take 1 tab;et by mouth for Hypothyroidism   NEOMYCIN-BACITRACIN-POLYMYXIN (NEOSPORIN) OINTMENT    Apply 1 application topically daily as needed for wound care. apply to eye   PANTOPRAZOLE (PROTONIX) 40 MG TABLET    Take 1 tablet (40 mg total) by mouth 2 (two) times daily before a meal. Take 1 tablet by mouth 2 times daily before a meal for GERD   POLYETHYLENE GLYCOL (MIRALAX / GLYCOLAX) PACKET    Take 17 g by mouth daily.   POLYVINYL ALCOHOL (LIQUIFILM TEARS) 1.4 % OPHTHALMIC SOLUTION    Place 1 drop into both eyes 3 (three) times daily.   POTASSIUM CHLORIDE SA (K-DUR,KLOR-CON) 20 MEQ TABLET    Take 2 tablets (40 mEq total) by mouth 2 (two) times daily. Take 2 tablets by mouth 2 times daily for hypokalemia   QUETIAPINE (SEROQUEL) 25 MG TABLET    Take 1 tablet (25 mg total) by mouth at bedtime. For paranoid delusions   RISPERIDONE (RISPERDAL) 0.5 MG TABLET    Take one tablet by mouth every 6 hours as needed for agitation.   SERTRALINE (ZOLOFT) 100 MG TABLET    Take 1 tablet (100 mg  total) by mouth daily. Take 1 tablet by mouth daily for Depression   WHITE PETROLATUM-MINERAL OIL (ARTIFICIAL TEARS) OINT OPHTHALMIC OINTMENT    Place 1 application into both eyes at bedtime.  Modified Medications   No medications on file  Discontinued Medications   CALCIUM CARBONATE (OS-CAL - DOSED IN MG OF ELEMENTAL CALCIUM) 1250 MG TABLET    Take 1 tablet by mouth 2 (two) times daily with a meal.   CIPROFLOXACIN (CIPRO) 250 MG TABLET    Take 250 mg by mouth 2 (two) times daily. X 7 days per Urologist   DIPTHERIA-TETANUS TOXOIDS [facility]) 2-2 LF/0.5ML INJECTION    Inject 0.5 mLs into the muscle once.   OXYCODONE (OXY IR/ROXICODONE) 5 MG IMMEDIATE RELEASE TABLET    Take 2 tablets (10 mg total) by mouth every 4 (four) hours as needed for severe pain.   ZOSTER VACCINE LIVE, PF, (ZOSTAVAX) 55732 UNT/0.65ML INJECTION    Inject 19,400 Units into the skin once.     Physical Exam: Filed Vitals:   12/06/13 1542  BP: 110/60  Pulse: 82  Temp: 99.4 F (37.4 C)  TempSrc: Oral  Resp: 10  Height: 5\' 1"  (1.549 m)  Weight: 119 lb (53.978 kg)  SpO2: 97%  Physical Exam  Constitutional: No distress.  Appears to have lost weight (she did)  Cardiovascular: Normal rate, regular rhythm, normal heart sounds and intact distal pulses.   Pulmonary/Chest: Effort normal and breath sounds normal. No respiratory distress.  Abdominal: Soft. Bowel sounds are normal. She exhibits no distension and no mass. There is tenderness.  suprapubic tenderness  Musculoskeletal: Normal range of motion.  Neurological: She is alert. Coordination normal.  Oriented to person only  Skin: Skin is warm and dry.  Psychiatric:  Flat affect;  Is picking at "lint" on her clothes, picking things off  the floor, etc.      Labs reviewed: Basic Metabolic Panel:  Recent Labs  07/29/13 1159 08/12/13 2151 08/20/13 1101  NA 139 138 141  K 4.9 4.0 4.1  CL 99 98 98  CO2 26 24 24   GLUCOSE 109* 119* 109*  BUN 38* 18 18  CREATININE  1.67* 1.32* 1.54*  CALCIUM 10.3 9.8 10.5*  TSH  --   --  2.370   Liver Function Tests:  Recent Labs  07/29/13 1159 08/12/13 2151 08/20/13 1101  AST 24 19 24   ALT 11 9 11   ALKPHOS 133* 150* 147*  BILITOT 0.7 0.8 0.7  PROT 7.5 7.4 6.8  ALBUMIN 3.5 3.6  --    No results found for this basename: LIPASE, AMYLASE,  in the last 8760 hours No results found for this basename: AMMONIA,  in the last 8760 hours CBC:  Recent Labs  07/17/13 1145  07/29/13 1159 08/12/13 2151 08/20/13 1101  WBC 6.6  < > 11.8* 13.8* 8.9  NEUTROABS 5.0  --   --  11.2* 6.3  HGB 10.8*  < > 12.7 11.8* 11.2  HCT 33.0*  < > 38.5 35.7* 34.9  MCV 97.3  < > 96.5 95.7 97  PLT 117*  < > 136* 128* 127*  < > = values in this interval not displayed.  Assessment/Plan 1. Subacute delirium - may be UTI as she does have some dysuria and suprapubic tenderness, her po intake has been poor -this could be progression of her dementia or psychotic symptoms related to her depression, but seems most consistent with delirium to me--check labs: - CBC With differential/Platelet - Comprehensive metabolic panel -Urine dipstick -not helped by hydrocodone for pain, but she has not done well w/o this for her leg pain -on seroquel qhs and prn risperdal for agitation  2. Mixed vascular and neurodegenerative dementia, with behavioral disturbance -recently worse, but seems she has a delirium superimposed on her dementia -is losing weight with decreased po intake  3. Chronic systolic congestive heart failure -does not appear to be having acute exacerbation of this--check labs--if her renal function is significantly worse, will need to hold her lasix   4. Atrial fibrillation, unspecified -stable, unchanged  5. Chronic kidney disease (CKD), stage III (moderate) -encourage hydration with water and encouragement with meals also, f/u renal function  6. Hypothyroidism due to acquired atrophy of thyroid -r/o worsening of this as  cause - TSH  Labs/tests ordered: cbc, bmp, tsh, urine dipstick was positive and sent for culture Next appt:  2 wks with Janett Billow

## 2013-12-07 LAB — CBC WITH DIFFERENTIAL
Basophils Absolute: 0 10*3/uL (ref 0.0–0.2)
Basos: 0 %
Eos: 3 %
Eosinophils Absolute: 0.2 10*3/uL (ref 0.0–0.4)
HCT: 29.6 % — ABNORMAL LOW (ref 34.0–46.6)
Hemoglobin: 9.9 g/dL — ABNORMAL LOW (ref 11.1–15.9)
Immature Grans (Abs): 0 10*3/uL (ref 0.0–0.1)
Immature Granulocytes: 0 %
Lymphocytes Absolute: 1.2 10*3/uL (ref 0.7–3.1)
Lymphs: 17 %
MCH: 30.6 pg (ref 26.6–33.0)
MCHC: 33.4 g/dL (ref 31.5–35.7)
MCV: 91 fL (ref 79–97)
Monocytes Absolute: 0.6 10*3/uL (ref 0.1–0.9)
Monocytes: 9 %
Neutrophils Absolute: 5.1 10*3/uL (ref 1.4–7.0)
Neutrophils Relative %: 71 %
Platelets: 130 10*3/uL — ABNORMAL LOW (ref 150–379)
RBC: 3.24 x10E6/uL — ABNORMAL LOW (ref 3.77–5.28)
RDW: 15.5 % — ABNORMAL HIGH (ref 12.3–15.4)
WBC: 7.2 10*3/uL (ref 3.4–10.8)

## 2013-12-07 LAB — COMPREHENSIVE METABOLIC PANEL
ALT: 10 IU/L (ref 0–32)
AST: 22 IU/L (ref 0–40)
Albumin/Globulin Ratio: 1.5 (ref 1.1–2.5)
Albumin: 4 g/dL (ref 3.5–4.7)
Alkaline Phosphatase: 114 IU/L (ref 39–117)
BUN/Creatinine Ratio: 17 (ref 11–26)
BUN: 31 mg/dL — ABNORMAL HIGH (ref 8–27)
CO2: 24 mmol/L (ref 18–29)
Calcium: 9.4 mg/dL (ref 8.7–10.3)
Chloride: 97 mmol/L (ref 97–108)
Creatinine, Ser: 1.78 mg/dL — ABNORMAL HIGH (ref 0.57–1.00)
GFR calc Af Amer: 29 mL/min/{1.73_m2} — ABNORMAL LOW (ref >59)
GFR calc non Af Amer: 25 mL/min/{1.73_m2} — ABNORMAL LOW (ref >59)
Globulin, Total: 2.7 g/dL (ref 1.5–4.5)
Glucose: 105 mg/dL — ABNORMAL HIGH (ref 65–99)
Potassium: 4.3 mmol/L (ref 3.5–5.2)
Sodium: 140 mmol/L (ref 134–144)
Total Bilirubin: 0.7 mg/dL (ref 0.0–1.2)
Total Protein: 6.7 g/dL (ref 6.0–8.5)

## 2013-12-07 LAB — TSH: TSH: 0.463 u[IU]/mL (ref 0.450–4.500)

## 2013-12-08 LAB — URINE CULTURE

## 2013-12-10 ENCOUNTER — Other Ambulatory Visit: Payer: Self-pay | Admitting: *Deleted

## 2013-12-10 MED ORDER — CIPROFLOXACIN HCL 250 MG PO TABS: ORAL_TABLET | ORAL | Status: DC

## 2013-12-10 MED ORDER — SACCHAROMYCES BOULARDII 250 MG PO CAPS: ORAL_CAPSULE | ORAL | Status: DC

## 2013-12-10 NOTE — Telephone Encounter (Signed)
rx sent to the pharmacy for both Cipro 250 mg & Florastor 25 mg.

## 2013-12-27 ENCOUNTER — Encounter: Payer: Self-pay | Admitting: Nurse Practitioner

## 2013-12-27 ENCOUNTER — Encounter: Payer: Medicare HMO | Admitting: Internal Medicine

## 2013-12-27 ENCOUNTER — Encounter: Payer: Medicare Other | Admitting: Internal Medicine

## 2013-12-27 ENCOUNTER — Ambulatory Visit (INDEPENDENT_AMBULATORY_CARE_PROVIDER_SITE_OTHER): Payer: Medicare HMO | Admitting: Nurse Practitioner

## 2013-12-27 VITALS — BP 132/80 | HR 76 | Temp 97.8°F | Wt 137.0 lb

## 2013-12-27 DIAGNOSIS — N039 Chronic nephritic syndrome with unspecified morphologic changes: Secondary | ICD-10-CM

## 2013-12-27 DIAGNOSIS — N183 Chronic kidney disease, stage 3 unspecified: Secondary | ICD-10-CM

## 2013-12-27 DIAGNOSIS — D631 Anemia in chronic kidney disease: Secondary | ICD-10-CM

## 2013-12-27 DIAGNOSIS — N189 Chronic kidney disease, unspecified: Secondary | ICD-10-CM

## 2013-12-27 DIAGNOSIS — M25569 Pain in unspecified knee: Secondary | ICD-10-CM

## 2013-12-27 MED ORDER — FENTANYL 25 MCG/HR TD PT72: 25.0000 ug | MEDICATED_PATCH | TRANSDERMAL | Status: DC

## 2013-12-27 MED ORDER — HYDROCODONE-ACETAMINOPHEN 5-325 MG PO TABS
1.0000 | ORAL_TABLET | Freq: Four times a day (QID) | ORAL | Status: DC
Start: 2013-12-27 — End: 2014-01-25

## 2013-12-27 NOTE — Progress Notes (Signed)
Patient ID: [name] [name], female   DOB: Sep 26, 1924, 80 y.o.   MRN: [id number]   Location:  [facility]  Code Status: DNR  Allergies  Allergen Reactions  . Ace Inhibitors Cough  . Requip [Ropinirole Hcl] Other (See Comments)    confusion    Chief Complaint  Patient presents with  . Follow-up    F/U for delirium and confusion .   [name] [name] Results    Discuss lab results     HPI: Patient is a 80 y.o. white female seen in the office today for follow up confusion.   Here with daughter- reports UTI has cleared up, no burning with urination Emotional outburst has improved. Still having a lot of confusion about where she is. Anxiety has improved and pt has been in a good mood. Daughter reports she is drowsy at times but this is not new.  Eating and drinking well. Questions if she still needs florastore Labs reviewed with daughter    Review of Systems:  Review of Systems  Constitutional: Negative for fever, chills, weight loss and malaise/fatigue.  HENT: Negative for congestion.   Eyes:       Glasses  Respiratory: Negative for shortness of breath.   Cardiovascular: Negative for chest pain and leg swelling.  Gastrointestinal: Negative for constipation.  Genitourinary: Negative for dysuria, urgency and frequency.  Musculoskeletal: Negative for falls.  Skin: Negative for rash.  Neurological: Negative for dizziness.  Psychiatric/Behavioral: Positive for memory loss. Negative for hallucinations.     Past Medical History  Diagnosis Date  . Atrial fibrillation   . BRONCHITIS, CHRONIC   . CHEST PAIN   . HYPOKALEMIA   . OBSTRUCTIVE SLEEP APNEA   . Palpitations   . SYSTOLIC HEART FAILURE, CHRONIC   . Depression   . CPAP (continuous positive airway pressure) dependence   . ACE-inhibitor cough   . Cardiac defibrillator in situ   . Chronic renal insufficiency   . GERD (gastroesophageal reflux disease)   . Hypothyroidism   . Hematuria     . Osteoarthritis (arthritis due to wear and tear of joints)   . Colon polyps   . Chronic rhinitis   . RLS (restless legs syndrome)   . Chronic back pain   . Arthritis   . Glaucoma   . Sleep apnea   . CHF (congestive heart failure)   . Dementia   . H/O colonoscopy 2005    Past Surgical History  Procedure Laterality Date  . Back surgery  1973  . Cardiac defibrillator placement  2005     [place] [phone]  . Skin cancer excision    . Cardiac catheterization  07/29/2003     Congestive heart failure, questionable etiology.  Increased LVEDP of 8mmHg on cath --  Nonobstructive coronary disease --         . Insert / replace / remove pacemaker  01/10/2004    Aborted biventricular implantable cardiac defibrillator implantation, complicated by probable pneumothorax  --  Deboraha Sprang, M.D.  . Atrial cardiac pacemaker insertion  01/15/2004    St. Jude 1581 65 cm dual coil active fixation defibrillator lead, serial number [id number] --  St. Jude Atlas +HF model V340CRT defibrillator, serial [id number]. -- Biventricular implantable cardioverter-defibrillator implantation, contrast venography and intraoperative defibrillation threshold testing -- SURGEON:  Deboraha Sprang, M.D.  . Cardiac pacemaker placement  03/17/2009    generator change  --  [employer] Jude ICD model [phone], serial number  [id number]  -- Deboraha Sprang, MD, [facility]   . Femur fracture surgery  2010  . Esophagogastroduodenoscopy  04/13/2011    Procedure: ESOPHAGOGASTRODUODENOSCOPY (EGD);  Surgeon: Zenovia Jarred, MD;  Location: [facility];  Service: Gastroenterology;  Laterality: N/A;  may need dilation    Social History:   reports that she quit smoking about 10 years ago. Her smoking use included Cigarettes. She smoked 1.00 pack per day. She has never used smokeless tobacco. She reports that she does not drink alcohol or use illicit drugs.  Family History  Problem Relation Age of Onset  . Heart disease Mother   . Colon cancer  Mother   . Colon polyps Mother   . Kidney disease Mother   . Emphysema Father   . Liver disease Brother   . Kidney disease Maternal Grandmother     Medications: Patient's Medications  New Prescriptions   No medications on file  Previous Medications   ALLOPURINOL (ZYLOPRIM) 100 MG TABLET    Take 1 tablet (100 mg total) by mouth daily. Take 1 tablet by mouth daily for Gout   ASCORBIC ACID (VITAMIN C) 500 MG TABLET    Take 500 mg by mouth daily.     ASPIRIN EC 81 MG TABLET    Take 1 tablet (81 mg total) by mouth daily.   CALCIUM CARB-CHOLECALCIFEROL (OYSTER SHELL CALCIUM + D PO)    Take by mouth. 1 by mouth four times daily with meals   CARVEDILOL (COREG) 6.25 MG TABLET    Take 1 tablet (6.25 mg total) by mouth 2 (two) times daily with a meal. Take 1 tablet m=by mouth 2 times daily with a meal for BP   CHOLECALCIFEROL (VITAMIN D3) 50000 UNITS CAPS    Take by mouth. 1 by mouth weekly on Wednesdays   CRANBERRY-VITAMIN C-INULIN (UTI-STAT) LIQD    30 mLs. Taking 30 ml by mouth every morning   FENTANYL (DURAGESIC - DOSED MCG/HR) 25 MCG/HR PATCH    Place 1 patch (25 mcg total) onto the skin every 3 (three) days. Place 1 patch onto the skin every 3 days for pain   FUROSEMIDE (LASIX) 40 MG TABLET    Take 1 tablet (40 mg total) by mouth 2 (two) times daily. Take 1 tablet by mouth 2 times daily for Edema   HYDRALAZINE (APRESOLINE) 10 MG TABLET    Take 1 tablet (10 mg total) by mouth 3 (three) times daily. Take 1 tablet by mouth 3 times daily, breakfast, 2:00 pm and bedtime for BP   HYDROCODONE-ACETAMINOPHEN (NORCO/VICODIN) 5-325 MG PER TABLET    Take 1 tablet by mouth every 6 (six) hours. For leg pain   ISOSORBIDE MONONITRATE (IMDUR) 30 MG 24 HR TABLET    Take 1 tablet (30 mg total) by mouth daily. Take 1 tablet by mouth daily for BP   LEVOTHYROXINE (SYNTHROID, LEVOTHROID) 88 MCG TABLET    Take 1 tab;et by mouth for Hypothyroidism   NEOMYCIN-BACITRACIN-POLYMYXIN (NEOSPORIN) OINTMENT    Apply 1  application topically daily as needed for wound care. apply to eye   PANTOPRAZOLE (PROTONIX) 40 MG TABLET    Take 1 tablet (40 mg total) by mouth 2 (two) times daily before a meal. Take 1 tablet by mouth 2 times daily before a meal for GERD   POLYETHYLENE GLYCOL (MIRALAX / GLYCOLAX) PACKET    Take 17 g by mouth daily.   POLYVINYL ALCOHOL (LIQUIFILM TEARS) 1.4 % OPHTHALMIC SOLUTION    Place 1 drop into both eyes  3 (three) times daily.   POTASSIUM CHLORIDE SA (K-DUR,KLOR-CON) 20 MEQ TABLET    Take 2 tablets (40 mEq total) by mouth 2 (two) times daily. Take 2 tablets by mouth 2 times daily for hypokalemia   QUETIAPINE (SEROQUEL) 25 MG TABLET    Take 1 tablet (25 mg total) by mouth at bedtime. For paranoid delusions   RISPERIDONE (RISPERDAL) 0.5 MG TABLET    Take one tablet by mouth every 6 hours as needed for agitation.   SERTRALINE (ZOLOFT) 100 MG TABLET    Take 1 tablet (100 mg total) by mouth daily. Take 1 tablet by mouth daily for Depression   WHITE PETROLATUM-MINERAL OIL (ARTIFICIAL TEARS) OINT OPHTHALMIC OINTMENT    Place 1 application into both eyes at bedtime.  Modified Medications   No medications on file  Discontinued Medications   CIPROFLOXACIN (CIPRO) 250 MG TABLET    Take 1 tablet every 12 hours by mouth for UTI   SACCHAROMYCES BOULARDII (FLORASTOR) 250 MG CAPSULE    Take 1 tablet by mouth every 12 hours for to prevent antibiotic associated diarrhea     Physical Exam: Filed Vitals:   12/27/13 1452  BP: 132/80  Pulse: 76  Temp: 97.8 F (36.6 C)  TempSrc: Oral  Weight: 137 lb (62.143 kg)  SpO2: 96%  Physical Exam  Constitutional: No distress.  Cardiovascular: Normal rate, regular rhythm, normal heart sounds and intact distal pulses.   Pulmonary/Chest: Effort normal and breath sounds normal. No respiratory distress.  Abdominal: Soft. Bowel sounds are normal. She exhibits no distension and no mass. There is no tenderness.  Musculoskeletal: Normal range of motion.  Neurological:  She is alert. Coordination normal.  Oriented to person only  Skin: Skin is warm and dry.  Psychiatric: She has a normal mood and affect.    Labs reviewed: Basic Metabolic Panel:  Recent Labs  08/12/13 2151 08/20/13 1101 12/06/13 1656  NA 138 141 140  K 4.0 4.1 4.3  CL 98 98 97  CO2 24 24 24   GLUCOSE 119* 109* 105*  BUN 18 18 31*  CREATININE 1.32* 1.54* 1.78*  CALCIUM 9.8 10.5* 9.4  TSH  --  2.370 0.463   Liver Function Tests:  Recent Labs  07/29/13 1159 08/12/13 2151 08/20/13 1101 12/06/13 1656  AST 24 19 24 22   ALT 11 9 11 10   ALKPHOS 133* 150* 147* 114  BILITOT 0.7 0.8 0.7 0.7  PROT 7.5 7.4 6.8 6.7  ALBUMIN 3.5 3.6  --   --    No results found for this basename: LIPASE, AMYLASE,  in the last 8760 hours No results found for this basename: AMMONIA,  in the last 8760 hours CBC:  Recent Labs  08/12/13 2151 08/20/13 1101 12/06/13 1656  WBC 13.8* 8.9 7.2  NEUTROABS 11.2* 6.3 5.1  HGB 11.8* 11.2 9.9*  HCT 35.7* 34.9 29.6*  MCV 95.7 97 91  PLT 128* 127* 130*    Assessment/Plan  1. Pain in joint, lower leg, unspecified laterality -chronic pain, needing refils on medication, will provide  - fentaNYL (DURAGESIC - DOSED MCG/HR) 25 MCG/HR patch; Place 1 patch (25 mcg total) onto the skin every 3 (three) days. Place 1 patch onto the skin every 3 days for pain  Dispense: 10 patch; Refill: 0 - HYDROcodone-acetaminophen (NORCO/VICODIN) 5-325 MG per tablet; Take 1 tablet by mouth every 6 (six) hours. For leg pain  Dispense: 120 tablet; Refill: 0  2. Chronic kidney disease (CKD), stage III (moderate) -worse on last lab,  eating and drinking better now, will follow up bmp  3. Anemia in chronic kidney disease -worse on recent labs, discussed referral for further workup due to decrease in hgb with daughter, at this time will cont to monitor  - CBC With differential/Platelet - Basic metabolic panel  4. Subacute delirium Improved   5. UTI -symptoms have resolved  and completed treatment,  -will dc florastor  Follow up with Dr Mariea Clonts in 2 month or sooner if need

## 2013-12-28 ENCOUNTER — Encounter: Payer: Self-pay | Admitting: *Deleted

## 2013-12-28 LAB — CBC WITH DIFFERENTIAL
BASOS ABS: 0 10*3/uL (ref 0.0–0.2)
Basos: 0 %
EOS: 9 %
Eosinophils Absolute: 0.5 10*3/uL — ABNORMAL HIGH (ref 0.0–0.4)
HCT: 28.3 % — ABNORMAL LOW (ref 34.0–46.6)
Hemoglobin: 9.1 g/dL — ABNORMAL LOW (ref 11.1–15.9)
Immature Grans (Abs): 0 10*3/uL (ref 0.0–0.1)
Immature Granulocytes: 0 %
LYMPHS: 23 %
Lymphocytes Absolute: 1.2 10*3/uL (ref 0.7–3.1)
MCH: 30.1 pg (ref 26.6–33.0)
MCHC: 32.2 g/dL (ref 31.5–35.7)
MCV: 94 fL (ref 79–97)
MONOCYTES: 6 %
Monocytes Absolute: 0.3 10*3/uL (ref 0.1–0.9)
NEUTROS ABS: 3.1 10*3/uL (ref 1.4–7.0)
Neutrophils Relative %: 62 %
Platelets: 115 10*3/uL — ABNORMAL LOW (ref 150–379)
RBC: 3.02 x10E6/uL — AB (ref 3.77–5.28)
RDW: 16.9 % — ABNORMAL HIGH (ref 12.3–15.4)
WBC: 5.1 10*3/uL (ref 3.4–10.8)

## 2013-12-28 LAB — BASIC METABOLIC PANEL
BUN/Creatinine Ratio: 21 (ref 11–26)
BUN: 29 mg/dL — AB (ref 8–27)
CO2: 24 mmol/L (ref 18–29)
CREATININE: 1.35 mg/dL — AB (ref 0.57–1.00)
Calcium: 9.4 mg/dL (ref 8.7–10.3)
Chloride: 98 mmol/L (ref 97–108)
GFR calc non Af Amer: 35 mL/min/{1.73_m2} — ABNORMAL LOW (ref >59)
GFR, EST AFRICAN AMERICAN: 41 mL/min/{1.73_m2} — AB (ref >59)
Glucose: 97 mg/dL (ref 65–99)
Potassium: 4.2 mmol/L (ref 3.5–5.2)
Sodium: 138 mmol/L (ref 134–144)

## 2014-01-07 ENCOUNTER — Other Ambulatory Visit: Payer: Self-pay | Admitting: Internal Medicine

## 2014-01-07 MED ORDER — CLOTRIMAZOLE-BETAMETHASONE 1-0.05 % EX CREA: 1.0000 "application " | TOPICAL_CREAM | Freq: Two times a day (BID) | CUTANEOUS | Status: DC

## 2014-01-17 ENCOUNTER — Encounter: Payer: Self-pay | Admitting: Cardiology

## 2014-01-17 ENCOUNTER — Ambulatory Visit (INDEPENDENT_AMBULATORY_CARE_PROVIDER_SITE_OTHER): Payer: Medicare HMO | Admitting: Cardiology

## 2014-01-17 ENCOUNTER — Ambulatory Visit (INDEPENDENT_AMBULATORY_CARE_PROVIDER_SITE_OTHER): Payer: Medicare HMO | Admitting: Internal Medicine

## 2014-01-17 ENCOUNTER — Encounter: Payer: Self-pay | Admitting: Internal Medicine

## 2014-01-17 VITALS — BP 92/52 | HR 82 | Ht 66.0 in

## 2014-01-17 VITALS — BP 110/65 | HR 82 | Ht 66.0 in | Wt 137.0 lb

## 2014-01-17 DIAGNOSIS — I5022 Chronic systolic (congestive) heart failure: Secondary | ICD-10-CM

## 2014-01-17 DIAGNOSIS — I428 Other cardiomyopathies: Secondary | ICD-10-CM

## 2014-01-17 DIAGNOSIS — I509 Heart failure, unspecified: Secondary | ICD-10-CM

## 2014-01-17 DIAGNOSIS — R079 Chest pain, unspecified: Secondary | ICD-10-CM

## 2014-01-17 DIAGNOSIS — Z95810 Presence of automatic (implantable) cardiac defibrillator: Secondary | ICD-10-CM

## 2014-01-17 DIAGNOSIS — R634 Abnormal weight loss: Secondary | ICD-10-CM

## 2014-01-17 LAB — MDC_IDC_ENUM_SESS_TYPE_INCLINIC
Battery Remaining Longevity: 58.8 mo
Brady Statistic RA Percent Paced: 0.44 %
Date Time Interrogation Session: 20150910163857
HighPow Impedance: 37.7352
Lead Channel Impedance Value: 512.5 Ohm
Lead Channel Pacing Threshold Amplitude: 0.625 V
Lead Channel Pacing Threshold Amplitude: 0.75 V
Lead Channel Pacing Threshold Pulse Width: 0.5 ms
Lead Channel Pacing Threshold Pulse Width: 0.5 ms
Lead Channel Sensing Intrinsic Amplitude: 11.7 mV
Lead Channel Sensing Intrinsic Amplitude: 2.1 mV
Lead Channel Setting Pacing Amplitude: 2 V
Lead Channel Setting Pacing Amplitude: 2 V
Lead Channel Setting Sensing Sensitivity: 0.5 mV
MDC IDC MSMT LEADCHNL RA PACING THRESHOLD AMPLITUDE: 0.75 V
MDC IDC MSMT LEADCHNL RA PACING THRESHOLD PULSEWIDTH: 0.5 ms
MDC IDC MSMT LEADCHNL RV IMPEDANCE VALUE: 437.5 Ohm
MDC IDC PG SERIAL: 752544
MDC IDC SET LEADCHNL RV PACING PULSEWIDTH: 0.5 ms
MDC IDC SET ZONE DETECTION INTERVAL: 300 ms
MDC IDC STAT BRADY RV PERCENT PACED: 0.27 %

## 2014-01-17 NOTE — Assessment & Plan Note (Signed)
The patient has not had any recent chest pain or increasing shortness of breath

## 2014-01-17 NOTE — Assessment & Plan Note (Signed)
She attributes to her 20 pound weight loss to the fact that the food is not too good at her present location.  Also she has less appetite because she is in a wheelchair and is getting no exercise.

## 2014-01-17 NOTE — Progress Notes (Signed)
Patient Care Team: Gayland Curry, DO as PCP - General (Geriatric Medicine) Darlin Coco, MD as Consulting Physician (Cardiology) Deboraha Sprang, MD as Consulting Physician (Cardiology) Katy Apo, MD as Consulting Physician (Ophthalmology) Clayborne Dana. Posey Pronto, MD as Consulting Physician (Nephrology) Jerene Bears, MD as Consulting Physician (Gastroenterology)   HPI  [name] [name] is a 80 y.o. female seen in followup for nonischemic cardiomyopathy with congestive heart failure for which she underwent CRT-D implantation in 2005 with device generator replacement in Dec 2010;  Following generator replacement she has noted diaphragmatic stimulation prompting inactivation of LV pacing. This had not been noticed with the prior device.  Assessment of left ventricular function by echo August 2011 had demonstrated late normalization of left ventricular function. Echocardiogram 6/14 however had showed recurrent deterioration with an EF of 20-25%.   My last note has cryptic remarks regarding end-of-life conversations. (So much for Dragon)  She denies chest pain or shortness of breath she is in a wheelchair.   Past Medical History  Diagnosis Date  . Atrial fibrillation   . BRONCHITIS, CHRONIC   . CHEST PAIN   . HYPOKALEMIA   . OBSTRUCTIVE SLEEP APNEA   . Palpitations   . SYSTOLIC HEART FAILURE, CHRONIC   . Depression   . CPAP (continuous positive airway pressure) dependence   . ACE-inhibitor cough   . Cardiac defibrillator in situ   . Chronic renal insufficiency   . GERD (gastroesophageal reflux disease)   . Hypothyroidism   . Hematuria   . Osteoarthritis (arthritis due to wear and tear of joints)   . Colon polyps   . Chronic rhinitis   . RLS (restless legs syndrome)   . Chronic back pain   . Arthritis   . Glaucoma   . Sleep apnea   . CHF (congestive heart failure)   . Dementia   . H/O colonoscopy 2005    Past Surgical History  Procedure Laterality Date  .  Back surgery  1973  . Cardiac defibrillator placement  2005     [place] [phone]  . Skin cancer excision    . Cardiac catheterization  07/29/2003     Congestive heart failure, questionable etiology.  Increased LVEDP of 57mmHg on cath --  Nonobstructive coronary disease --         . Insert / replace / remove pacemaker  01/10/2004    Aborted biventricular implantable cardiac defibrillator implantation, complicated by probable pneumothorax  --  Deboraha Sprang, M.D.  . Atrial cardiac pacemaker insertion  01/15/2004    St. Jude 1581 65 cm dual coil active fixation defibrillator lead, serial number [id number] --  St. Jude Atlas +HF model V340CRT defibrillator, serial [id number]. -- Biventricular implantable cardioverter-defibrillator implantation, contrast venography and intraoperative defibrillation threshold testing -- SURGEON:  Deboraha Sprang, M.D.  . Cardiac pacemaker placement  03/17/2009    generator change  --  [place] ICD model [phone], serial number [id number]  -- Deboraha Sprang, MD, [facility]   . Femur fracture surgery  2010  . Esophagogastroduodenoscopy  04/13/2011    Procedure: ESOPHAGOGASTRODUODENOSCOPY (EGD);  Surgeon: Zenovia Jarred, MD;  Location: [facility];  Service: Gastroenterology;  Laterality: N/A;  may need dilation    Current Outpatient Prescriptions  Medication Sig Dispense Refill  . allopurinol (ZYLOPRIM) 100 MG tablet Take 1 tablet (100 mg total) by mouth daily. Take 1 tablet by mouth daily for Gout  90 tablet  1  . Ascorbic Acid (  VITAMIN C) 500 MG tablet Take 500 mg by mouth daily.        [name] aspirin EC 81 MG tablet Take 1 tablet (81 mg total) by mouth daily.      . Calcium Carb-Cholecalciferol (OYSTER SHELL CALCIUM + D PO) Take by mouth. 1 by mouth four times daily with meals      . carvedilol (COREG) 6.25 MG tablet Take 1 tablet (6.25 mg total) by mouth 2 (two) times daily with a meal. Take 1 tablet m=by mouth 2 times daily with a meal for BP  180 tablet  1  .  Cholecalciferol (VITAMIN D3) 50000 UNITS CAPS Take by mouth. 1 by mouth weekly on Wednesdays      . clotrimazole-betamethasone (LOTRISONE) cream Apply 1 application topically 2 (two) times daily. Of arms for suspected fungal infection until resolved, then discontinue  45 g  1  . Cranberry-Vitamin C-Inulin (UTI-STAT) LIQD 30 mLs. Taking 30 ml by mouth every morning      . fentaNYL (DURAGESIC - DOSED MCG/HR) 25 MCG/HR patch Place 1 patch (25 mcg total) onto the skin every 3 (three) days. Place 1 patch onto the skin every 3 days for pain  10 patch  0  . furosemide (LASIX) 40 MG tablet Take 1 tablet (40 mg total) by mouth 2 (two) times daily. Take 1 tablet by mouth 2 times daily for Edema  180 tablet  1  . hydrALAZINE (APRESOLINE) 10 MG tablet Take 1 tablet (10 mg total) by mouth 3 (three) times daily. Take 1 tablet by mouth 3 times daily, breakfast, 2:00 pm and bedtime for BP  270 tablet  1  . HYDROcodone-acetaminophen (NORCO/VICODIN) 5-325 MG per tablet Take 1 tablet by mouth every 6 (six) hours. For leg pain  120 tablet  0  . isosorbide mononitrate (IMDUR) 30 MG 24 hr tablet Take 1 tablet (30 mg total) by mouth daily. Take 1 tablet by mouth daily for BP  90 tablet  1  . levothyroxine (SYNTHROID, LEVOTHROID) 88 MCG tablet Take 1 tab;et by mouth for Hypothyroidism  90 tablet  1  . neomycin-bacitracin-polymyxin (NEOSPORIN) ointment Apply 1 application topically daily as needed for wound care. apply to eye  15 g  1  . pantoprazole (PROTONIX) 40 MG tablet Take 1 tablet (40 mg total) by mouth 2 (two) times daily before a meal. Take 1 tablet by mouth 2 times daily before a meal for GERD  180 tablet  1  . polyethylene glycol (MIRALAX / GLYCOLAX) packet Take 17 g by mouth daily.      . polyvinyl alcohol (LIQUIFILM TEARS) 1.4 % ophthalmic solution Place 1 drop into both eyes 3 (three) times daily.  15 mL  3  . potassium chloride SA (K-DUR,KLOR-CON) 20 MEQ tablet Take 2 tablets (40 mEq total) by mouth 2 (two) times  daily. Take 2 tablets by mouth 2 times daily for hypokalemia  180 tablet  1  . QUEtiapine (SEROQUEL) 25 MG tablet Take 1 tablet (25 mg total) by mouth at bedtime. For paranoid delusions  30 tablet  3  . risperiDONE (RISPERDAL) 0.5 MG tablet Take one tablet by mouth every 6 hours as needed for agitation.  120 tablet  1  . sertraline (ZOLOFT) 100 MG tablet Take 1 tablet (100 mg total) by mouth daily. Take 1 tablet by mouth daily for Depression  90 tablet  1  . White Petrolatum-Mineral Oil (ARTIFICIAL TEARS) OINT ophthalmic ointment Place 1 application into both eyes at bedtime.  3.5 g  3   No current facility-administered medications for this visit.    Allergies  Allergen Reactions  . Ace Inhibitors Cough  . Requip [Ropinirole Hcl] Other (See Comments)    confusion    Review of Systems negative except from HPI and PMH  Physical Exam BP 92/52  Pulse 82  Ht 5\' 6"  (1.676 m) Well developed and well nourished in no acute distress HENT normal E scleral and icterus clear Neck Supple   Clear to ausculation  Regular rate and rhythm, no murmurs gallops or rub Soft with active bowel sounds No clubbing cyanosis 1+ Edema Alert sitting in a wheelchair Skin Warm and Dry    Assessment and  Plan  HFrEF  ICD   NICM  Hypotension  The patient is stable is surprisingly alert. We discussed the issue of ICD activation versus CPR and the association of the latter with potential body trauma i.e. broken ribs. At this point her daughter and she would like to maintain the defibrillator. I think that is reasonable. I will defer discussions regarding CPR to Dr. Mariea Clonts  We will see her again in one years time. She will follow up with Dr. Mare Ferrari.

## 2014-01-17 NOTE — Assessment & Plan Note (Signed)
Since last visit her peripheral edema has resolved.

## 2014-01-17 NOTE — Patient Instructions (Addendum)
Your physician recommends that you continue on your current medications as directed. Please refer to the Current Medication list given to you today.  Remote monitoring is used to monitor your ICD from home. This monitoring reduces the number of office visits required to check your device to one time per year. It allows us to keep an eye on the functioning of your device to ensure it is working properly. You are scheduled for a device check from home on 04-22-2014. You may send your transmission at any time that day. If you have a wireless device, the transmission will be sent automatically. After your physician reviews your transmission, you will receive a postcard with your next transmission date.  Your physician recommends that you schedule a follow-up appointment in: 12 months with Dr.Klein   

## 2014-01-17 NOTE — Patient Instructions (Signed)
Your physician wants you to follow-up in: 6 months/ ekg You will receive a reminder letter in the mail two months in advance. If you don't receive a letter, please call our office to schedule the follow-up appointment.  Your physician recommends that you continue on your current medications as directed. Please refer to the Current Medication list given to you today.

## 2014-01-17 NOTE — Progress Notes (Signed)
[place] Date of Birth:  19-Feb-1925 [facility] [address] [place], [place]  [address] [phone]        Fax   [phone]   History of Present Illness: This pleasant 80 year old woman is seen for a scheduled followup office visit. He has a history of a nonischemic dilated cardiomyopathy. She has a defibrillator in place.  Her defibrillator is followed by Dr. Caryl Comes. She has a history of compensated chronic systolic congestive heart failure. She has a history of renal insufficiency and is followed by Dr. Posey Pronto of nephrology.  Since we last saw her she fell and broke 3 ribs on the right side of her chest.  As a result she is confined to the wheelchair [facility]. Her last echocardiogram was 11/02/12 and showed an ejection fraction of 20-25% which is an improvement over the previous ejection fraction of 10-20% noted in 2005.  Overall she has been doing better.  She is having less problems with fluid retention.  Her weight is down 28 pounds since last visit.  Her appetite has been poor since she fell and fractured her ribs.   Current Outpatient Prescriptions  Medication Sig Dispense Refill  . allopurinol (ZYLOPRIM) 100 MG tablet Take 1 tablet (100 mg total) by mouth daily. Take 1 tablet by mouth daily for Gout  90 tablet  1  . Ascorbic Acid (VITAMIN C) 500 MG tablet Take 500 mg by mouth daily.        [name] aspirin EC 81 MG tablet Take 1 tablet (81 mg total) by mouth daily.      . Calcium Carb-Cholecalciferol (OYSTER SHELL CALCIUM + D PO) Take by mouth. 1 by mouth four times daily with meals      . carvedilol (COREG) 6.25 MG tablet Take 1 tablet (6.25 mg total) by mouth 2 (two) times daily with a meal. Take 1 tablet m=by mouth 2 times daily with a meal for BP  180 tablet  1  . Cholecalciferol (VITAMIN D3) 50000 UNITS CAPS Take by mouth. 1 by mouth weekly on Wednesdays      . clotrimazole-betamethasone (LOTRISONE) cream Apply 1 application  topically 2 (two) times daily. Of arms for suspected fungal infection until resolved, then discontinue  45 g  1  . Cranberry-Vitamin C-Inulin (UTI-STAT) LIQD 30 mLs. Taking 30 ml by mouth every morning      . fentaNYL (DURAGESIC - DOSED MCG/HR) 25 MCG/HR patch Place 1 patch (25 mcg total) onto the skin every 3 (three) days. Place 1 patch onto the skin every 3 days for pain  10 patch  0  . furosemide (LASIX) 40 MG tablet Take 1 tablet (40 mg total) by mouth 2 (two) times daily. Take 1 tablet by mouth 2 times daily for Edema  180 tablet  1  . hydrALAZINE (APRESOLINE) 10 MG tablet Take 1 tablet (10 mg total) by mouth 3 (three) times daily. Take 1 tablet by mouth 3 times daily, breakfast, 2:00 pm and bedtime for BP  270 tablet  1  . HYDROcodone-acetaminophen (NORCO/VICODIN) 5-325 MG per tablet Take 1 tablet by mouth every 6 (six) hours. For leg pain  120 tablet  0  . isosorbide mononitrate (IMDUR) 30 MG 24 hr tablet Take 1 tablet (30 mg total) by mouth daily. Take 1 tablet by mouth daily for BP  90 tablet  1  . levothyroxine (SYNTHROID, LEVOTHROID) 88 MCG tablet Take 1 tab;et by mouth for Hypothyroidism  90 tablet  1  . neomycin-bacitracin-polymyxin (NEOSPORIN) ointment Apply 1 application topically daily as needed for wound care. apply to eye  15 g  1  . pantoprazole (PROTONIX) 40 MG tablet Take 1 tablet (40 mg total) by mouth 2 (two) times daily before a meal. Take 1 tablet by mouth 2 times daily before a meal for GERD  180 tablet  1  . polyethylene glycol (MIRALAX / GLYCOLAX) packet Take 17 g by mouth daily.      . polyvinyl alcohol (LIQUIFILM TEARS) 1.4 % ophthalmic solution Place 1 drop into both eyes 3 (three) times daily.  15 mL  3  . potassium chloride SA (K-DUR,KLOR-CON) 20 MEQ tablet Take 2 tablets (40 mEq total) by mouth 2 (two) times daily. Take 2 tablets by mouth 2 times daily for hypokalemia  180 tablet  1  . QUEtiapine (SEROQUEL) 25 MG tablet Take 1 tablet (25 mg total) by mouth at bedtime.  For paranoid delusions  30 tablet  3  . risperiDONE (RISPERDAL) 0.5 MG tablet Take one tablet by mouth every 6 hours as needed for agitation.  120 tablet  1  . sertraline (ZOLOFT) 100 MG tablet Take 1 tablet (100 mg total) by mouth daily. Take 1 tablet by mouth daily for Depression  90 tablet  1  . White Petrolatum-Mineral Oil (ARTIFICIAL TEARS) OINT ophthalmic ointment Place 1 application into both eyes at bedtime.  3.5 g  3   No current facility-administered medications for this visit.    Allergies  Allergen Reactions  . Ace Inhibitors Cough  . Requip [Ropinirole Hcl] Other (See Comments)    confusion    Patient Active Problem List   Diagnosis Date Noted  . Unintended weight loss 01/17/2014  . Hypothyroidism due to acquired atrophy of thyroid 12/06/2013  . Hypothyroidism 09/28/2013  . Chronic kidney disease (CKD), stage III (moderate) 08/20/2013  . Gout 08/20/2013  . Mixed vascular and neurodegenerative dementia 08/20/2013  . Broken ribs 07/17/2013  . Failure to thrive 04/16/2013  . Chronic systolic congestive heart failure 10/24/2012  . Renal failure 10/14/2011  . Dysphagia 03/04/2011  . Biventricular implantable cardioverter-defibrillator LV lead off 09/15/2010  . CHEST PAIN 07/01/2009  . left ventricular lead-diaphragmatic stimulation-inactivated 06/03/2009  . PALPITATIONS 04/18/2009  . HYPOKALEMIA 03/20/2009  . Atrial fibrillation 03/14/2009  . RHINITIS 08/01/2007  . BRONCHITIS, CHRONIC 08/01/2007  . OBSTRUCTIVE SLEEP APNEA 04/20/2007  . RESTLESS LEG SYNDROME 04/20/2007    History  Smoking status  . Former Smoker -- 1.00 packs/day  . Types: Cigarettes  . Quit date: 04/10/2003  Smokeless tobacco  . Never Used    History  Alcohol Use No    Family History  Problem Relation Age of Onset  . Heart disease Mother   . Colon cancer Mother   . Colon polyps Mother   . Kidney disease Mother   . Emphysema Father   . Liver disease Brother   . Kidney disease  Maternal Grandmother     Review of Systems: Constitutional: no fever chills diaphoresis or fatigue or change in weight.  Head and neck: no hearing loss, no epistaxis, no photophobia or visual disturbance. Respiratory: No cough, shortness of breath or wheezing. Cardiovascular: No chest pain peripheral edema, palpitations. Gastrointestinal: No abdominal distention, no abdominal pain, no change in bowel habits hematochezia or melena. Genitourinary: No dysuria, no frequency, no urgency, no nocturia. Musculoskeletal:No arthralgias, no back pain, no gait disturbance or myalgias. Neurological: No dizziness, no headaches, no numbness, no seizures, no  syncope, no weakness, no tremors. Hematologic: No lymphadenopathy, no easy bruising. Psychiatric: No confusion, no hallucinations, no sleep disturbance.    Physical Exam: Filed Vitals:   01/17/14 1640  BP: 110/65  Pulse:   The patient appears to be in no distress.  She is in a wheelchair.  She tends to slouch off to the left side while sitting  Head and neck exam reveals that the pupils are equal and reactive.  The extraocular movements are full.  There is no scleral icterus.  Mouth and pharynx are benign.  No lymphadenopathy.  No carotid bruits.  The jugular venous pressure is normal.  Thyroid is not enlarged or tender.  Chest is clear to percussion and auscultation.  No rales or rhonchi.  Expansion of the chest is symmetrical.  The ICD is in the left upper chest.  Heart reveals no abnormal lift or heave.  First and second heart sounds are normal.  There is no murmur gallop rub or click.  The abdomen is soft and nontender.  Bowel sounds are normoactive.  There is no hepatosplenomegaly or mass.  There are no abdominal bruits.  Extremities reveal no phlebitis or edema.  Pedal pulses are good.  There is no cyanosis or clubbing.  Neurologic exam is normal strength and no lateralizing weakness.  No sensory deficits.  Integument reveals no  rash    Assessment / Plan: 1. chronic systolic congestive heart failure secondary to nonischemic cardiomyopathy 2. ICD in place. 3. chronic kidney disease stage III 4. unintended weight loss 5. 3 rib fractures secondary to fall.   Plan: Continue same medication.  Try not to lose any more weight.  Recheck in 6 months for office visit and EKG

## 2014-01-21 ENCOUNTER — Encounter: Payer: Self-pay | Admitting: Internal Medicine

## 2014-01-25 ENCOUNTER — Other Ambulatory Visit: Payer: Self-pay | Admitting: *Deleted

## 2014-01-25 DIAGNOSIS — M25569 Pain in unspecified knee: Secondary | ICD-10-CM

## 2014-01-25 MED ORDER — HYDROCODONE-ACETAMINOPHEN 5-325 MG PO TABS: 1.0000 | ORAL_TABLET | Freq: Four times a day (QID) | ORAL | Status: DC

## 2014-01-25 NOTE — Telephone Encounter (Signed)
Patient daughter, Paula Requested and will pick up 

## 2014-02-02 ENCOUNTER — Other Ambulatory Visit: Payer: Self-pay | Admitting: Internal Medicine

## 2014-02-06 ENCOUNTER — Other Ambulatory Visit: Payer: Self-pay | Admitting: *Deleted

## 2014-02-06 DIAGNOSIS — M25569 Pain in unspecified knee: Secondary | ICD-10-CM

## 2014-02-06 MED ORDER — FENTANYL 25 MCG/HR TD PT72: 25.0000 ug | MEDICATED_PATCH | TRANSDERMAL | Status: DC

## 2014-02-06 NOTE — Telephone Encounter (Signed)
Gerrit Heck called and requested # [phone]

## 2014-02-06 NOTE — Telephone Encounter (Signed)
Patient Requested and will pick up 

## 2014-02-15 ENCOUNTER — Other Ambulatory Visit: Payer: Self-pay | Admitting: *Deleted

## 2014-02-15 MED ORDER — CARVEDILOL 12.5 MG PO TABS: ORAL_TABLET | ORAL | Status: DC

## 2014-02-15 NOTE — Telephone Encounter (Signed)
[facility] Fax Order for Coreg 12.5 instead of 6.25. I faxed to pharmacy and faxed order back to facility.

## 2014-02-18 ENCOUNTER — Other Ambulatory Visit: Payer: Self-pay | Admitting: *Deleted

## 2014-02-18 MED ORDER — VITAMIN D3 1.25 MG (50000 UT) PO CAPS: ORAL_CAPSULE | ORAL | Status: DC

## 2014-02-18 MED ORDER — LEVOTHYROXINE SODIUM 88 MCG PO TABS: ORAL_TABLET | ORAL | Status: DC

## 2014-02-18 MED ORDER — ISOSORBIDE MONONITRATE ER 30 MG PO TB24: ORAL_TABLET | ORAL | Status: DC

## 2014-02-18 MED ORDER — SERTRALINE HCL 100 MG PO TABS: ORAL_TABLET | ORAL | Status: DC

## 2014-02-18 MED ORDER — HYDRALAZINE HCL 10 MG PO TABS: ORAL_TABLET | ORAL | Status: DC

## 2014-02-18 MED ORDER — ALLOPURINOL 100 MG PO TABS: ORAL_TABLET | ORAL | Status: DC

## 2014-02-18 MED ORDER — FUROSEMIDE 40 MG PO TABS: ORAL_TABLET | ORAL | Status: DC

## 2014-02-18 MED ORDER — PANTOPRAZOLE SODIUM 40 MG PO TBEC: DELAYED_RELEASE_TABLET | ORAL | Status: DC

## 2014-02-18 MED ORDER — CARVEDILOL 12.5 MG PO TABS: ORAL_TABLET | ORAL | Status: DC

## 2014-02-18 MED ORDER — POTASSIUM CHLORIDE CRYS ER 20 MEQ PO TBCR: EXTENDED_RELEASE_TABLET | ORAL | Status: DC

## 2014-02-18 NOTE — Telephone Encounter (Signed)
Nevin [place]good, daughter called and requested Rx to be faxed to [facility]

## 2014-02-20 ENCOUNTER — Other Ambulatory Visit: Payer: Self-pay | Admitting: *Deleted

## 2014-02-20 MED ORDER — LEVOTHYROXINE SODIUM 88 MCG PO TABS: ORAL_TABLET | ORAL | Status: DC

## 2014-02-20 NOTE — Telephone Encounter (Signed)
[facility] 

## 2014-02-21 ENCOUNTER — Other Ambulatory Visit: Payer: Self-pay | Admitting: *Deleted

## 2014-02-21 MED ORDER — LEVOTHYROXINE SODIUM 88 MCG PO TABS: ORAL_TABLET | ORAL | Status: DC

## 2014-02-21 NOTE — Telephone Encounter (Signed)
[facility] 

## 2014-02-25 ENCOUNTER — Other Ambulatory Visit: Payer: Self-pay | Admitting: *Deleted

## 2014-02-25 DIAGNOSIS — M25569 Pain in unspecified knee: Secondary | ICD-10-CM

## 2014-02-25 MED ORDER — LEVOTHYROXINE SODIUM 88 MCG PO TABS: ORAL_TABLET | ORAL | Status: DC

## 2014-02-25 MED ORDER — HYDROCODONE-ACETAMINOPHEN 5-325 MG PO TABS: 1.0000 | ORAL_TABLET | Freq: Four times a day (QID) | ORAL | Status: DC

## 2014-02-25 NOTE — Telephone Encounter (Signed)
[facility] Fax Order 

## 2014-02-25 NOTE — Telephone Encounter (Signed)
[facility] Requested 

## 2014-02-28 ENCOUNTER — Telehealth: Payer: Self-pay | Admitting: *Deleted

## 2014-02-28 DIAGNOSIS — M25569 Pain in unspecified knee: Secondary | ICD-10-CM

## 2014-02-28 MED ORDER — HYDROCODONE-ACETAMINOPHEN 5-325 MG PO TABS: ORAL_TABLET | ORAL | Status: DC

## 2014-02-28 NOTE — Telephone Encounter (Signed)
Rx Printed for Dr. Mariea Clonts to sign. Daughter will pick up hardcopy and copy faxed to [facility]. Spoke with Norlishia at [facility] to inform of change. Faxed new order to Fax # [phone]

## 2014-02-28 NOTE — Telephone Encounter (Signed)
Ok.  I thought I did this the other day.  I will sign it if you print it.

## 2014-02-28 NOTE — Telephone Encounter (Signed)
[name] [name], Daughter called and stated that they would like to change patient's Hydrocodone from every 6 hours TO every 6 hours as Needed. And wants it faxed to [facility]. Please Advise.

## 2014-03-14 ENCOUNTER — Ambulatory Visit: Payer: Medicare HMO | Admitting: Internal Medicine

## 2014-03-15 ENCOUNTER — Other Ambulatory Visit: Payer: Self-pay | Admitting: *Deleted

## 2014-03-15 DIAGNOSIS — M25569 Pain in unspecified knee: Secondary | ICD-10-CM

## 2014-03-15 MED ORDER — FENTANYL 25 MCG/HR TD PT72: 25.0000 ug | MEDICATED_PATCH | TRANSDERMAL | Status: DC

## 2014-03-15 NOTE — Telephone Encounter (Signed)
Patient daughter requested and will pick up 

## 2014-03-21 ENCOUNTER — Encounter: Payer: Self-pay | Admitting: Internal Medicine

## 2014-03-21 ENCOUNTER — Ambulatory Visit (INDEPENDENT_AMBULATORY_CARE_PROVIDER_SITE_OTHER): Payer: Medicare HMO | Admitting: *Deleted

## 2014-03-21 ENCOUNTER — Ambulatory Visit (INDEPENDENT_AMBULATORY_CARE_PROVIDER_SITE_OTHER): Payer: Medicare HMO | Admitting: Internal Medicine

## 2014-03-21 VITALS — BP 130/60 | HR 78 | Temp 96.5°F | Ht 66.0 in | Wt 129.4 lb

## 2014-03-21 DIAGNOSIS — I5022 Chronic systolic (congestive) heart failure: Secondary | ICD-10-CM

## 2014-03-21 DIAGNOSIS — N183 Chronic kidney disease, stage 3 unspecified: Secondary | ICD-10-CM

## 2014-03-21 DIAGNOSIS — K5900 Constipation, unspecified: Secondary | ICD-10-CM

## 2014-03-21 DIAGNOSIS — Z8719 Personal history of other diseases of the digestive system: Secondary | ICD-10-CM

## 2014-03-21 DIAGNOSIS — F0151 Vascular dementia with behavioral disturbance: Secondary | ICD-10-CM

## 2014-03-21 DIAGNOSIS — R09A2 Foreign body sensation, throat: Secondary | ICD-10-CM

## 2014-03-21 DIAGNOSIS — R198 Other specified symptoms and signs involving the digestive system and abdomen: Secondary | ICD-10-CM

## 2014-03-21 DIAGNOSIS — Z23 Encounter for immunization: Secondary | ICD-10-CM

## 2014-03-21 DIAGNOSIS — F458 Other somatoform disorders: Secondary | ICD-10-CM

## 2014-03-21 DIAGNOSIS — K5909 Other constipation: Secondary | ICD-10-CM

## 2014-03-21 DIAGNOSIS — R0989 Other specified symptoms and signs involving the circulatory and respiratory systems: Secondary | ICD-10-CM

## 2014-03-21 DIAGNOSIS — F01518 Vascular dementia, unspecified severity, with other behavioral disturbance: Secondary | ICD-10-CM

## 2014-03-21 DIAGNOSIS — F0391 Unspecified dementia with behavioral disturbance: Secondary | ICD-10-CM

## 2014-03-21 DIAGNOSIS — R634 Abnormal weight loss: Secondary | ICD-10-CM

## 2014-03-21 DIAGNOSIS — F0390 Unspecified dementia without behavioral disturbance: Secondary | ICD-10-CM

## 2014-03-21 DIAGNOSIS — D638 Anemia in other chronic diseases classified elsewhere: Secondary | ICD-10-CM

## 2014-03-21 MED ORDER — SENNOSIDES-DOCUSATE SODIUM 8.6-50 MG PO TABS: 2.0000 | ORAL_TABLET | Freq: Every day | ORAL | Status: AC

## 2014-03-21 NOTE — Progress Notes (Signed)
Patient ID: [name] [name], female   DOB: [birth year], 80 y.o.   MRN: [id number]   Location:  [facility]  Code Status: DNR  Allergies  Allergen Reactions  . Ace Inhibitors Cough  . Requip [Ropinirole Hcl] Other (See Comments)    confusion    Chief Complaint  Patient presents with  . Follow-up    2 month follow up    HPI: Patient is a 80 y.o. seen in the office today for a 2 month f/u of medical mgt of chronic conditions.  Pt says she has trouble with choking spells.  Happens especially when she's eating certain things.  Not always right after eating.  Can happen an hour later.  Got meds last night and had trouble afterwards.  Her daughter has witnessed it happening hours after eating.  Is breathing and talking.  Has a globus sensation.  Thinks she is stooped over and that interferes.    Reviewed xray 08/03/11:  Stable small Zenker's diverticulum, diffuse esophageal spasm/dysmotility, large hiatal hernia.   Did have esophagus stretched at one time 04/13/11 by Dr. Hilarie Fredrickson with EGD.  Cscope 12/16/03 Eats very slowly and has continued to lose weight.  Lost 8 lbs since September (2 mos).    Has hard stools.  Is going just about daily.    Review of Systems:  Review of Systems  Constitutional: Negative for fever.  HENT: Negative for congestion.   Eyes: Negative for blurred vision.  Respiratory: Negative for shortness of breath.   Cardiovascular: Negative for chest pain and leg swelling.  Gastrointestinal: Negative for abdominal pain.       Difficulty swallowing  Genitourinary: Positive for urgency and frequency. Negative for dysuria.  Musculoskeletal: Negative for myalgias and falls.  Skin: Negative for rash.  Neurological: Negative for dizziness.  Endo/Heme/Allergies: Does not bruise/bleed easily.  Psychiatric/Behavioral: Positive for memory loss. Negative for depression.     Past Medical History  Diagnosis Date  . Atrial  fibrillation   . BRONCHITIS, CHRONIC   . CHEST PAIN   . HYPOKALEMIA   . OBSTRUCTIVE SLEEP APNEA   . Palpitations   . SYSTOLIC HEART FAILURE, CHRONIC   . Depression   . CPAP (continuous positive airway pressure) dependence   . ACE-inhibitor cough   . Cardiac defibrillator in situ   . Chronic renal insufficiency   . GERD (gastroesophageal reflux disease)   . Hypothyroidism   . Hematuria   . Osteoarthritis (arthritis due to wear and tear of joints)   . Colon polyps   . Chronic rhinitis   . RLS (restless legs syndrome)   . Chronic back pain   . Arthritis   . Glaucoma   . Sleep apnea   . CHF (congestive heart failure)   . Dementia   . H/O colonoscopy 2005    Past Surgical History  Procedure Laterality Date  . Back surgery  1973  . Cardiac defibrillator placement  2005     [place] [id number]  . Skin cancer excision    . Cardiac catheterization  07/29/2003     Congestive heart failure, questionable etiology.  Increased LVEDP of 69mmHg on cath --  Nonobstructive coronary disease --         . Insert / replace / remove pacemaker  01/10/2004    Aborted biventricular implantable cardiac defibrillator implantation, complicated by probable pneumothorax  --  Deboraha Sprang, M.D.  . Atrial cardiac pacemaker insertion  01/15/2004  St. Jude 1581 65 cm dual coil active fixation defibrillator lead, serial number [id number] --  St. Jude Atlas +HF model V340CRT defibrillator, serial [id number]. -- Biventricular implantable cardioverter-defibrillator implantation, contrast venography and intraoperative defibrillation threshold testing -- SURGEON:  Deboraha Sprang, M.D.  . Cardiac pacemaker placement  03/17/2009    generator change  --  Unify St. Jude ICD model [phone], serial number [id number]  -- Deboraha Sprang, MD, [facility]   . Femur fracture surgery  2010  . Esophagogastroduodenoscopy  04/13/2011    Procedure: ESOPHAGOGASTRODUODENOSCOPY (EGD);  Surgeon: Zenovia Jarred, MD;  Location: [facility];   Service: Gastroenterology;  Laterality: N/A;  may need dilation    Social History:   reports that she quit smoking about 10 years ago. Her smoking use included Cigarettes. She smoked 1.00 pack per day. She has never used smokeless tobacco. She reports that she does not drink alcohol or use illicit drugs.  Family History  Problem Relation Age of Onset  . Heart disease Mother   . Colon cancer Mother   . Colon polyps Mother   . Kidney disease Mother   . Emphysema Father   . Liver disease Brother   . Kidney disease Maternal Grandmother     Medications: Patient's Medications  New Prescriptions   No medications on file  Previous Medications   ALLOPURINOL (ZYLOPRIM) 100 MG TABLET    Take 1 tablet by mouth daily for Gout   ASCORBIC ACID (VITAMIN C) 500 MG TABLET    Take 500 mg by mouth daily.     ASPIRIN EC 81 MG TABLET    Take 1 tablet (81 mg total) by mouth daily.   CALCIUM CARB-CHOLECALCIFEROL (OYSTER SHELL CALCIUM + D PO)    Take by mouth. 1 by mouth four times daily with meals   CARVEDILOL (COREG) 12.5 MG TABLET    Take 1/2 tablet by mouth twice daily   CHOLECALCIFEROL (VITAMIN D3) 50000 UNITS CAPS    1 by mouth weekly on Wednesdays   CRANBERRY-VITAMIN C-INULIN (UTI-STAT) LIQD    30 mLs. Taking 30 ml by mouth every morning   FENTANYL (DURAGESIC - DOSED MCG/HR) 25 MCG/HR PATCH    Place 1 patch (25 mcg total) onto the skin every 3 (three) days. Place 1 patch onto the skin every 3 days for pain   FUROSEMIDE (LASIX) 40 MG TABLET    Take 1 tablet by mouth 2 times daily for Edema   HYDRALAZINE (APRESOLINE) 10 MG TABLET    Take 1 tablet by mouth 3 times daily, breakfast, 2:00 pm and bedtime for BP   HYDROCODONE-ACETAMINOPHEN (NORCO/VICODIN) 5-325 MG PER TABLET    Take one tablet by mouth every 6 hours as needed for leg pain   ISOSORBIDE MONONITRATE (IMDUR) 30 MG 24 HR TABLET    Take 1 tablet by mouth daily for BP   LEVOTHYROXINE (SYNTHROID, LEVOTHROID) 88 MCG TABLET    Take one tablet by  mouth 30 minutes before breakfast for thyroid.   PANTOPRAZOLE (PROTONIX) 40 MG TABLET    Take 1 tablet by mouth 2 times daily before a meal for GERD   POLYETHYLENE GLYCOL (MIRALAX / GLYCOLAX) PACKET    Take 17 g by mouth as needed.    POLYVINYL ALCOHOL (LIQUIFILM TEARS) 1.4 % OPHTHALMIC SOLUTION    Place 1 drop into both eyes 3 (three) times daily.   POTASSIUM CHLORIDE SA (K-DUR,KLOR-CON) 20 MEQ TABLET    Take 2 tablets by mouth 2 times daily for hypokalemia  QUETIAPINE (SEROQUEL) 25 MG TABLET    Take 1 tablet (25 mg total) by mouth at bedtime. For paranoid delusions   RISPERIDONE (RISPERDAL) 0.5 MG TABLET    TAKE ONE TABLET BY MOUTH EVERY 6 HOURS AS NEEDED FOR AGITATION.   SERTRALINE (ZOLOFT) 100 MG TABLET    Take 1 tablet by mouth daily for Depression   WHITE PETROLATUM-MINERAL OIL (ARTIFICIAL TEARS) OINT OPHTHALMIC OINTMENT    Place 1 application into both eyes at bedtime.  Modified Medications   No medications on file  Discontinued Medications   CLOTRIMAZOLE-BETAMETHASONE (LOTRISONE) CREAM    Apply 1 application topically 2 (two) times daily. Of arms for suspected fungal infection until resolved, then discontinue   NEOMYCIN-BACITRACIN-POLYMYXIN (NEOSPORIN) OINTMENT    Apply 1 application topically daily as needed for wound care. apply to eye     Physical Exam: Filed Vitals:   03/21/14 0849  BP: 130/60  Pulse: 78  Temp: 96.5 F (35.8 C)  TempSrc: Oral  Height: 5\' 6"  (1.676 m)  Weight: 129 lb 6.4 oz (58.695 kg)  SpO2: 92%  Physical Exam  Constitutional: No distress.  Frail white female  Cardiovascular: Normal rate, regular rhythm, normal heart sounds and intact distal pulses.   Pulmonary/Chest: Effort normal and breath sounds normal. No respiratory distress.  Abdominal: Soft. Bowel sounds are normal. She exhibits no distension and no mass. There is no tenderness.  Musculoskeletal:  In wheelchair, requires assistance with transfers  Neurological: She is alert.  Poor short term  memory but pleasant alert and answering appropriately today  Skin: Skin is warm and dry. There is pallor.     Labs reviewed: Basic Metabolic Panel:  Recent Labs  08/20/13 1101 12/06/13 1656 12/27/13 1545  NA 141 140 138  K 4.1 4.3 4.2  CL 98 97 98  CO2 24 24 24   GLUCOSE 109* 105* 97  BUN 18 31* 29*  CREATININE 1.54* 1.78* 1.35*  CALCIUM 10.5* 9.4 9.4  TSH 2.370 0.463  --    Liver Function Tests:  Recent Labs  07/29/13 1159 08/12/13 2151 08/20/13 1101 12/06/13 1656  AST 24 19 24 22   ALT 11 9 11 10   ALKPHOS 133* 150* 147* 114  BILITOT 0.7 0.8 0.7 0.7  PROT 7.5 7.4 6.8 6.7  ALBUMIN 3.5 3.6  --   --    No results for input(s): LIPASE, AMYLASE in the last 8760 hours. No results for input(s): AMMONIA in the last 8760 hours. CBC:  Recent Labs  08/20/13 1101 12/06/13 1656 12/27/13 1545  WBC 8.9 7.2 5.1  NEUTROABS 6.3 5.1 3.1  HGB 11.2 9.9* 9.1*  HCT 34.9 29.6* 28.3*  MCV 97 91 94  PLT 127* 130* 115*   Lipid Panel: No results for input(s): CHOL, HDL, LDLCALC, TRIG, CHOLHDL, LDLDIRECT in the last 8760 hours. No results found for: HGBA1C  Past Procedures: Reviewed as above  Assessment/Plan 1. Globus sensation - prior stricture that was dilated, f/u with Dr. Hilarie Fredrickson - Ambulatory referral to Gastroenterology  2. History of esophageal stricture - sounds like she needs dilation again -will ask staff to sit pt up in a chair during meals to help posture - Ambulatory referral to Gastroenterology  3. Loss of weight -due to #2 I suspect plus her dementia - Ambulatory referral to Gastroenterology - CBC With differential/Platelet  4. Chronic kidney disease (CKD), stage III (moderate) - previously was seeing dr. Posey Pronto regularly, but difficult to get out to doctors-will f/u labs--if much worse,  Will re-refer and ask  what he recommends I check regularly - Basic metabolic panel  5. Mixed vascular and neurodegenerative dementia, with behavioral  disturbance -stable at present  6. Chronic systolic congestive heart failure - stable, has some dyspnea on exdertion - Basic metabolic panel  7. Chronic constipation -cont miralax and add senna s due to hard stools (daughter to pick up senna s at pharmacy, order was written)  8. Anemia of chronic disease - recheck -mixed etiology from chronic kidney disease and cameron erosions from hiatal hernia - CBC With differential/Platelet - Basic metabolic panel  Labs/tests ordered: Orders Placed This Encounter  Procedures  . CBC With differential/Platelet  . Basic metabolic panel  . Ambulatory referral to Gastroenterology    Referral Priority:  Routine    Referral Type:  Consultation    Referral Reason:  Specialty Services Required    Requested Specialty:  Gastroenterology    Number of Visits Requested:  1   Had flu shot  Next appt:  3 mos  Anaisa Radi L. Nema Oatley, D.O. [facility] [address], [place] [address] Cell Phone (Mon-Fri 8am-5pm):  [phone] On Call:  [phone] & follow prompts after 5pm & weekends Office Phone:  [phone] Office Fax:  [phone]

## 2014-03-22 LAB — CBC WITH DIFFERENTIAL
Basophils Absolute: 0 10*3/uL (ref 0.0–0.2)
Basos: 0 %
Eos: 4 %
Eosinophils Absolute: 0.3 10*3/uL (ref 0.0–0.4)
HCT: 32.4 % — ABNORMAL LOW (ref 34.0–46.6)
Hemoglobin: 10.5 g/dL — ABNORMAL LOW (ref 11.1–15.9)
Immature Grans (Abs): 0 10*3/uL (ref 0.0–0.1)
Immature Granulocytes: 0 %
Lymphocytes Absolute: 1.2 10*3/uL (ref 0.7–3.1)
Lymphs: 15 %
MCH: 29.9 pg (ref 26.6–33.0)
MCHC: 32.4 g/dL (ref 31.5–35.7)
MCV: 92 fL (ref 79–97)
Monocytes Absolute: 0.5 10*3/uL (ref 0.1–0.9)
Monocytes: 6 %
Neutrophils Absolute: 5.6 10*3/uL (ref 1.4–7.0)
Neutrophils Relative %: 75 %
Platelets: 125 10*3/uL — ABNORMAL LOW (ref 150–379)
RBC: 3.51 x10E6/uL — ABNORMAL LOW (ref 3.77–5.28)
RDW: 15.1 % (ref 12.3–15.4)
WBC: 7.6 10*3/uL (ref 3.4–10.8)

## 2014-03-22 LAB — BASIC METABOLIC PANEL
BUN/Creatinine Ratio: 16 (ref 11–26)
BUN: 23 mg/dL (ref 8–27)
CO2: 25 mmol/L (ref 18–29)
Calcium: 9.9 mg/dL (ref 8.7–10.3)
Chloride: 100 mmol/L (ref 97–108)
Creatinine, Ser: 1.41 mg/dL — ABNORMAL HIGH (ref 0.57–1.00)
GFR calc Af Amer: 39 mL/min/{1.73_m2} — ABNORMAL LOW (ref >59)
GFR calc non Af Amer: 34 mL/min/{1.73_m2} — ABNORMAL LOW (ref >59)
Glucose: 100 mg/dL — ABNORMAL HIGH (ref 65–99)
Potassium: 4.2 mmol/L (ref 3.5–5.2)
Sodium: 140 mmol/L (ref 134–144)

## 2014-03-24 ENCOUNTER — Other Ambulatory Visit: Payer: Self-pay | Admitting: Internal Medicine

## 2014-03-26 ENCOUNTER — Telehealth: Payer: Self-pay | Admitting: *Deleted

## 2014-03-26 NOTE — Telephone Encounter (Signed)
-----   Message from Gayland Curry, DO sent at 03/26/2014 10:45 AM EST ----- Please inform her daughter and fax results to her facility [facility]).  Anemia is slightly better.  Platelets remain slightly low.  Renal function stable.  No changes needed to regimen.

## 2014-03-26 NOTE — Telephone Encounter (Signed)
Called patient's daughter did not receive answer, and messaging was full unable to leave message. Faxing over result's to [employer]" Fax [phone]

## 2014-03-28 ENCOUNTER — Encounter: Payer: Self-pay | Admitting: Nurse Practitioner

## 2014-04-08 ENCOUNTER — Ambulatory Visit (INDEPENDENT_AMBULATORY_CARE_PROVIDER_SITE_OTHER): Payer: Medicare HMO | Admitting: Nurse Practitioner

## 2014-04-08 ENCOUNTER — Other Ambulatory Visit: Payer: Self-pay | Admitting: *Deleted

## 2014-04-08 ENCOUNTER — Encounter: Payer: Self-pay | Admitting: Nurse Practitioner

## 2014-04-08 VITALS — BP 90/58 | HR 80

## 2014-04-08 DIAGNOSIS — R1310 Dysphagia, unspecified: Secondary | ICD-10-CM

## 2014-04-08 MED ORDER — QUETIAPINE FUMARATE 25 MG PO TABS: 25.0000 mg | ORAL_TABLET | Freq: Every day | ORAL | Status: DC

## 2014-04-08 NOTE — Patient Instructions (Signed)
You have been scheduled for a Barium Esophogram at [facility] (1st floor of the hospital) on 04-15-2014 at 10:30 am. Please arrive 15 minutes prior to your appointment for registration. Make certain not to have anything to eat or drink 6 hours prior to your test. If you need to reschedule for any reason, please contact radiology at [phone] to do so. __________________________________________________________________ A barium swallow is an examination that concentrates on views of the esophagus. This tends to be a double contrast exam (barium and two liquids which, when combined, create a gas to distend the wall of the oesophagus) or single contrast (non-ionic iodine based). The study is usually tailored to your symptoms so a good history is essential. Attention is paid during the study to the form, structure and configuration of the esophagus, looking for functional disorders (such as aspiration, dysphagia, achalasia, motility and reflux) EXAMINATION You may be asked to change into a gown, depending on the type of swallow being performed. A radiologist and radiographer will perform the procedure. The radiologist will advise you of the type of contrast selected for your procedure and direct you during the exam. You will be asked to stand, sit or lie in several different positions and to hold a small amount of fluid in your mouth before being asked to swallow while the imaging is performed .In some instances you may be asked to swallow barium coated marshmallows to assess the motility of a solid food bolus. The exam can be recorded as a digital or video fluoroscopy procedure. POST PROCEDURE It will take 1-2 days for the barium to pass through your system. To facilitate this, it is important, unless otherwise directed, to increase your fluids for the next 24-48hrs and to resume your normal diet.  This test typically takes about 30 minutes to  perform. __________________________________________________________________________________

## 2014-04-08 NOTE — Progress Notes (Addendum)
HPI :  Patient is an 80 year old female known to Dr. Hilarie Fredrickson. She was evaluated by him December 2012 for dysphagia. 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 The distal esophagus was tortuous with a mild stricture/narrowing at the GE junction which was balloon dilated.  There was a large, 6 cm hiatal hernia with Cameron erosions. Mild gastritis was found. Gastric biopsies compatible with mild, chronic gastritis without H. Pylori.  Patient comes in with her daughter for evaluation of recurrent dysphagia. She has had intermittent problems swallowing solid foods " for a good while now". It sounds like problems started within the last couple of months.Berniece Salines, eggs and biscuits have been problematic as of late.  Daughter sometimes eats with her mother at assisted living facility and offers that sometimes she has no problems swallowing. Patient on twice a day PPI  Patient also complains of intermittent gurgling in her upper abdomen. No nausea or vomiting. She battles with constipation defined as hard stools but is takes pain medications for arthritis. Patient doesn't think she is taking MiraLAX. She has recently started Senokot. She complains of pressure in her lower abdomen when standing .  Past Medical History  Diagnosis Date  . Atrial fibrillation   . BRONCHITIS, CHRONIC   . OBSTRUCTIVE SLEEP APNEA   . Palpitations   . SYSTOLIC HEART FAILURE, CHRONIC   . Depression   . CPAP (continuous positive airway pressure) dependence   . ACE-inhibitor cough   . Cardiac defibrillator in situ   . Chronic renal insufficiency   . GERD (gastroesophageal reflux disease)   . Hypothyroidism   . Hematuria   . Osteoarthritis (arthritis due to wear and tear of joints)   . Colon polyps   . Chronic rhinitis   . RLS (restless legs syndrome)   . Chronic back pain   . Arthritis   . Glaucoma   . Sleep apnea   . CHF (congestive heart failure)   . Dementia   . H/O colonoscopy 2005  . Anxiety   . Kidney disease    . Thyroid disease     Family History  Problem Relation Age of Onset  . Heart disease Mother   . Colon cancer Mother   . Colon polyps Mother   . Kidney disease Mother   . Emphysema Father   . Liver disease Brother   . Kidney disease Maternal Grandmother   . Breast cancer Maternal Aunt   . Heart disease Father    History  Substance Use Topics  . Smoking status: Former Smoker -- 1.00 packs/day    Types: Cigarettes    Quit date: 04/10/2003  . Smokeless tobacco: Never Used  . Alcohol Use: No   Current Outpatient Prescriptions  Medication Sig Dispense Refill  . allopurinol (ZYLOPRIM) 100 MG tablet Take 1 tablet by mouth daily for Gout 90 tablet 3  . Ascorbic Acid (VITAMIN C) 500 MG tablet Take 500 mg by mouth daily.      [name] aspirin EC 81 MG tablet Take 1 tablet (81 mg total) by mouth daily.    . Calcium Carb-Cholecalciferol (OYSTER SHELL CALCIUM + D PO) Take by mouth. 1 by mouth four times daily with meals    . carvedilol (COREG) 12.5 MG tablet Take 1/2 tablet by mouth twice daily 90 tablet 3  . Cholecalciferol (VITAMIN D3) 50000 UNITS CAPS 1 by mouth weekly on Wednesdays 4 capsule 1  . Cranberry-Vitamin C-Inulin (UTI-STAT) LIQD 30 mLs. Taking 30 ml by mouth every morning    .  fentaNYL (DURAGESIC - DOSED MCG/HR) 25 MCG/HR patch Place 1 patch (25 mcg total) onto the skin every 3 (three) days. Place 1 patch onto the skin every 3 days for pain 10 patch 0  . furosemide (LASIX) 40 MG tablet Take 1 tablet by mouth 2 times daily for Edema 180 tablet 3  . hydrALAZINE (APRESOLINE) 10 MG tablet Take 1 tablet by mouth 3 times daily, breakfast, 2:00 pm and bedtime for BP 270 tablet 3  . HYDROcodone-acetaminophen (NORCO/VICODIN) 5-325 MG per tablet Take one tablet by mouth every 6 hours as needed for leg pain 120 tablet 0  . isosorbide mononitrate (IMDUR) 30 MG 24 hr tablet Take 1 tablet by mouth daily for BP 90 tablet 3  . levothyroxine (SYNTHROID, LEVOTHROID) 88 MCG tablet Take one tablet by  mouth 30 minutes before breakfast for thyroid. 90 tablet 3  . pantoprazole (PROTONIX) 40 MG tablet Take 1 tablet by mouth 2 times daily before a meal for GERD 180 tablet 3  . polyethylene glycol (MIRALAX / GLYCOLAX) packet Take 17 g by mouth as needed.     . polyvinyl alcohol (LIQUIFILM TEARS) 1.4 % ophthalmic solution Place 1 drop into both eyes 3 (three) times daily. 15 mL 3  . potassium chloride SA (K-DUR,KLOR-CON) 20 MEQ tablet Take 2 tablets by mouth 2 times daily for hypokalemia 180 tablet 1  . QUEtiapine (SEROQUEL) 25 MG tablet TAKE 1 TABLET BY MOUTH AT BEDTIME 30 tablet 3  . risperiDONE (RISPERDAL) 0.5 MG tablet TAKE ONE TABLET BY MOUTH EVERY 6 HOURS AS NEEDED FOR AGITATION. 120 tablet 1  . senna-docusate (SENOKOT-S) 8.6-50 MG per tablet Take 2 tablets by mouth daily. Hold for loose stools 60 tablet 3  . sertraline (ZOLOFT) 100 MG tablet Take 1 tablet by mouth daily for Depression 90 tablet 3  . White Petrolatum-Mineral Oil (ARTIFICIAL TEARS) OINT ophthalmic ointment Place 1 application into both eyes at bedtime. 3.5 g 3   No current facility-administered medications for this visit.   Allergies  Allergen Reactions  . Ace Inhibitors Cough  . Requip [Ropinirole Hcl] Other (See Comments)    confusion   Review of Systems: Positive for anxiety, arthritis, back pain, confusion, fatigue, hearing problems, muscle pain and cramps and shortness of breath . All other systems reviewed and negative except where noted in HPI.    Physical Exam: BP 90/58 mmHg  Pulse 80  Ht   Wt  Constitutional: Pleasant,well-developed, white female in a wheelchair in no acute distress. HEENT: Normocephalic and atraumatic. Conjunctivae are normal. No scleral icterus. Neck supple.  Cardiovascular: Normal rate, regular rhythm.  Pulmonary/chest: Effort normal and breath sounds normal. No wheezing, rales or rhonchi. Abdominal: Limited exam, patient cannot get onto exam table. Abdomen soft, nontender.  Bowel  sounds active throughout.  Extremities: no edema Lymphadenopathy: No cervical adenopathy noted. Neurological: Alert and oriented to person place and time. Skin: Skin is warm and dry. Psychiatric: Normal mood and affect. Behavior is normal.   ASSESSMENT AND PLAN:  45. 80 year old female with recurrent, solid food dysphagia /  "gurgling" in upper abdomen.  She has known presbyesophagus / tortuous esophagus / 6cm hiatal hernia all found on EGD with balloon dilation 3 years ago..Small Zenker's diverticulum on esophogram 2013.  Details are vague as to when dysphagia recurred but I assume patient did get some relief post dilation since she has not been back for 3 years. Patient looks well, her weight is stable.   We discussed EGD with repeat dilation but  given advanced age and co- morbidities will first obtain a barium swallow with tablet  Patient and daughter are agreeable to this plan. Will call daughter Nevin Bloodgood with results.  We discussed the importance of eating slowly, taking small bites of food, chewing well and consuming adequate amounts of fluid in between bites to avoid food impaction.  2. Chronic thrombocytopenia, stable. Normal appearing spleen on CTscan March of this year.   3. Lytic lytic and sclerotic T11 on CTscan March 2015. Clinical significance?   4. Multiple medical problems as listed in PMH   Addendum: Reviewed and agree with initial management. Jerene Bears, MD

## 2014-04-08 NOTE — Telephone Encounter (Signed)
Patient daughter called and requested mail order and local pharmacy Rx.

## 2014-04-11 ENCOUNTER — Other Ambulatory Visit: Payer: Self-pay | Admitting: Nurse Practitioner

## 2014-04-12 DIAGNOSIS — S81802D Unspecified open wound, left lower leg, subsequent encounter: Secondary | ICD-10-CM

## 2014-04-12 DIAGNOSIS — E039 Hypothyroidism, unspecified: Secondary | ICD-10-CM

## 2014-04-12 DIAGNOSIS — I10 Essential (primary) hypertension: Secondary | ICD-10-CM

## 2014-04-13 ENCOUNTER — Other Ambulatory Visit: Payer: Self-pay | Admitting: Internal Medicine

## 2014-04-15 ENCOUNTER — Other Ambulatory Visit: Payer: Self-pay | Admitting: *Deleted

## 2014-04-15 ENCOUNTER — Ambulatory Visit (HOSPITAL_COMMUNITY)
Admission: RE | Admit: 2014-04-15 | Discharge: 2014-04-15 | Disposition: A | Discharge status: Home / Self Care | Payer: Commercial Managed Care - HMO | Source: Ambulatory Visit | Attending: Nurse Practitioner | Admitting: Nurse Practitioner

## 2014-04-15 DIAGNOSIS — R1310 Dysphagia, unspecified: Secondary | ICD-10-CM | POA: Insufficient documentation

## 2014-04-15 DIAGNOSIS — M25569 Pain in unspecified knee: Secondary | ICD-10-CM

## 2014-04-15 DIAGNOSIS — K224 Dyskinesia of esophagus: Secondary | ICD-10-CM | POA: Insufficient documentation

## 2014-04-15 DIAGNOSIS — K449 Diaphragmatic hernia without obstruction or gangrene: Secondary | ICD-10-CM | POA: Diagnosis not present

## 2014-04-15 MED ORDER — FENTANYL 25 MCG/HR TD PT72: MEDICATED_PATCH | TRANSDERMAL | Status: DC

## 2014-04-15 NOTE — Telephone Encounter (Signed)
Patient Requested and will pick up 

## 2014-04-16 ENCOUNTER — Encounter: Payer: Self-pay | Admitting: Internal Medicine

## 2014-04-22 ENCOUNTER — Encounter: Payer: Self-pay | Admitting: Internal Medicine

## 2014-04-22 ENCOUNTER — Ambulatory Visit (INDEPENDENT_AMBULATORY_CARE_PROVIDER_SITE_OTHER): Payer: Medicare HMO | Admitting: *Deleted

## 2014-04-22 DIAGNOSIS — I428 Other cardiomyopathies: Secondary | ICD-10-CM

## 2014-04-22 DIAGNOSIS — I429 Cardiomyopathy, unspecified: Secondary | ICD-10-CM

## 2014-04-22 LAB — MDC_IDC_ENUM_SESS_TYPE_REMOTE
Battery Remaining Percentage: 50 %
Brady Statistic AP VP Percent: 1 %
Brady Statistic AS VP Percent: 1 %
Brady Statistic AS VS Percent: 99 %
Brady Statistic RV Percent Paced: 1 %
HighPow Impedance: 39 Ohm
Implantable Pulse Generator Serial Number: 752544
Lead Channel Impedance Value: 390 Ohm
Lead Channel Impedance Value: 550 Ohm
Lead Channel Pacing Threshold Amplitude: 0.75 V
Lead Channel Pacing Threshold Pulse Width: 0.5 ms
Lead Channel Sensing Intrinsic Amplitude: 11.7 mV
Lead Channel Setting Pacing Amplitude: 2 V
Lead Channel Setting Pacing Amplitude: 3.375
Lead Channel Setting Sensing Sensitivity: 0.5 mV
MDC IDC MSMT BATTERY REMAINING LONGEVITY: 47 mo
MDC IDC MSMT BATTERY VOLTAGE: 2.93 V
MDC IDC MSMT LEADCHNL RA PACING THRESHOLD PULSEWIDTH: 0.5 ms
MDC IDC MSMT LEADCHNL RA SENSING INTR AMPL: 1.7 mV
MDC IDC MSMT LEADCHNL RV PACING THRESHOLD AMPLITUDE: 2.375 V
MDC IDC SESS DTM: 20151214071749
MDC IDC SET LEADCHNL RV PACING PULSEWIDTH: 0.5 ms
MDC IDC SET ZONE DETECTION INTERVAL: 300 ms
MDC IDC STAT BRADY AP VS PERCENT: 1 %
MDC IDC STAT BRADY RA PERCENT PACED: 1.1 %

## 2014-04-22 NOTE — Progress Notes (Signed)
Remote ICD transmission.   

## 2014-04-23 ENCOUNTER — Telehealth: Payer: Self-pay | Admitting: *Deleted

## 2014-04-23 NOTE — Telephone Encounter (Signed)
Spoke with Stephani Police and she will set up mobile X-Ray

## 2014-04-23 NOTE — Telephone Encounter (Signed)
Ok to obtain xray of left leg if her daughter is in agreement.

## 2014-04-23 NOTE — Telephone Encounter (Signed)
Stephani Police with [facility] called and stated that patient wants a X-ray of the Left Leg. She hit it on the wheelchair and it is uncomfortable and painful. It is not warm to the touch or red. Please Advise.

## 2014-05-02 ENCOUNTER — Other Ambulatory Visit: Payer: Self-pay | Admitting: Internal Medicine

## 2014-05-13 ENCOUNTER — Other Ambulatory Visit: Payer: Self-pay | Admitting: *Deleted

## 2014-05-13 DIAGNOSIS — M25569 Pain in unspecified knee: Secondary | ICD-10-CM

## 2014-05-13 MED ORDER — FENTANYL 25 MCG/HR TD PT72: MEDICATED_PATCH | TRANSDERMAL | Status: DC

## 2014-05-13 NOTE — Telephone Encounter (Signed)
[name] [name] called and requested Rx and will pick up

## 2014-05-16 ENCOUNTER — Encounter: Payer: Self-pay | Admitting: Cardiology

## 2014-05-16 ENCOUNTER — Other Ambulatory Visit: Payer: Self-pay | Admitting: *Deleted

## 2014-05-16 MED ORDER — PANTOPRAZOLE SODIUM 40 MG PO TBEC: DELAYED_RELEASE_TABLET | ORAL | Status: AC

## 2014-05-16 NOTE — Telephone Encounter (Signed)
Patient daughter requested local pharmacy Rx until they receive mail order.

## 2014-05-28 ENCOUNTER — Other Ambulatory Visit: Payer: Self-pay | Admitting: *Deleted

## 2014-05-28 MED ORDER — AMBULATORY NON FORMULARY MEDICATION: Status: AC

## 2014-05-28 NOTE — Telephone Encounter (Signed)
Received fax order from [facility] for order.

## 2014-05-30 ENCOUNTER — Telehealth: Payer: Self-pay | Admitting: *Deleted

## 2014-05-30 NOTE — Telephone Encounter (Signed)
Received fax form from [place] for patient to receive Standard Wheelchair with Cushion and Elevated leg rests. I filled out form and gave to Dr. Mariea Clonts to review and sign.

## 2014-06-02 ENCOUNTER — Other Ambulatory Visit: Payer: Self-pay | Admitting: Nurse Practitioner

## 2014-06-03 ENCOUNTER — Encounter: Payer: Self-pay | Admitting: *Deleted

## 2014-06-03 NOTE — Telephone Encounter (Signed)
Dr. Mariea Clonts reviewed and signed. Faxed to [facility]. Fax #: [phone] #: [phone] Attn: Malachy Mood

## 2014-06-10 IMAGING — CR DG CHEST 2V
1 series · 1 of 1 positions shown · non-contrast
Comparison: CT chest dated 07/17/2013

CLINICAL DATA: Altered mental status, AFib

EXAM:
CHEST  2 VIEW

[view not recorded]
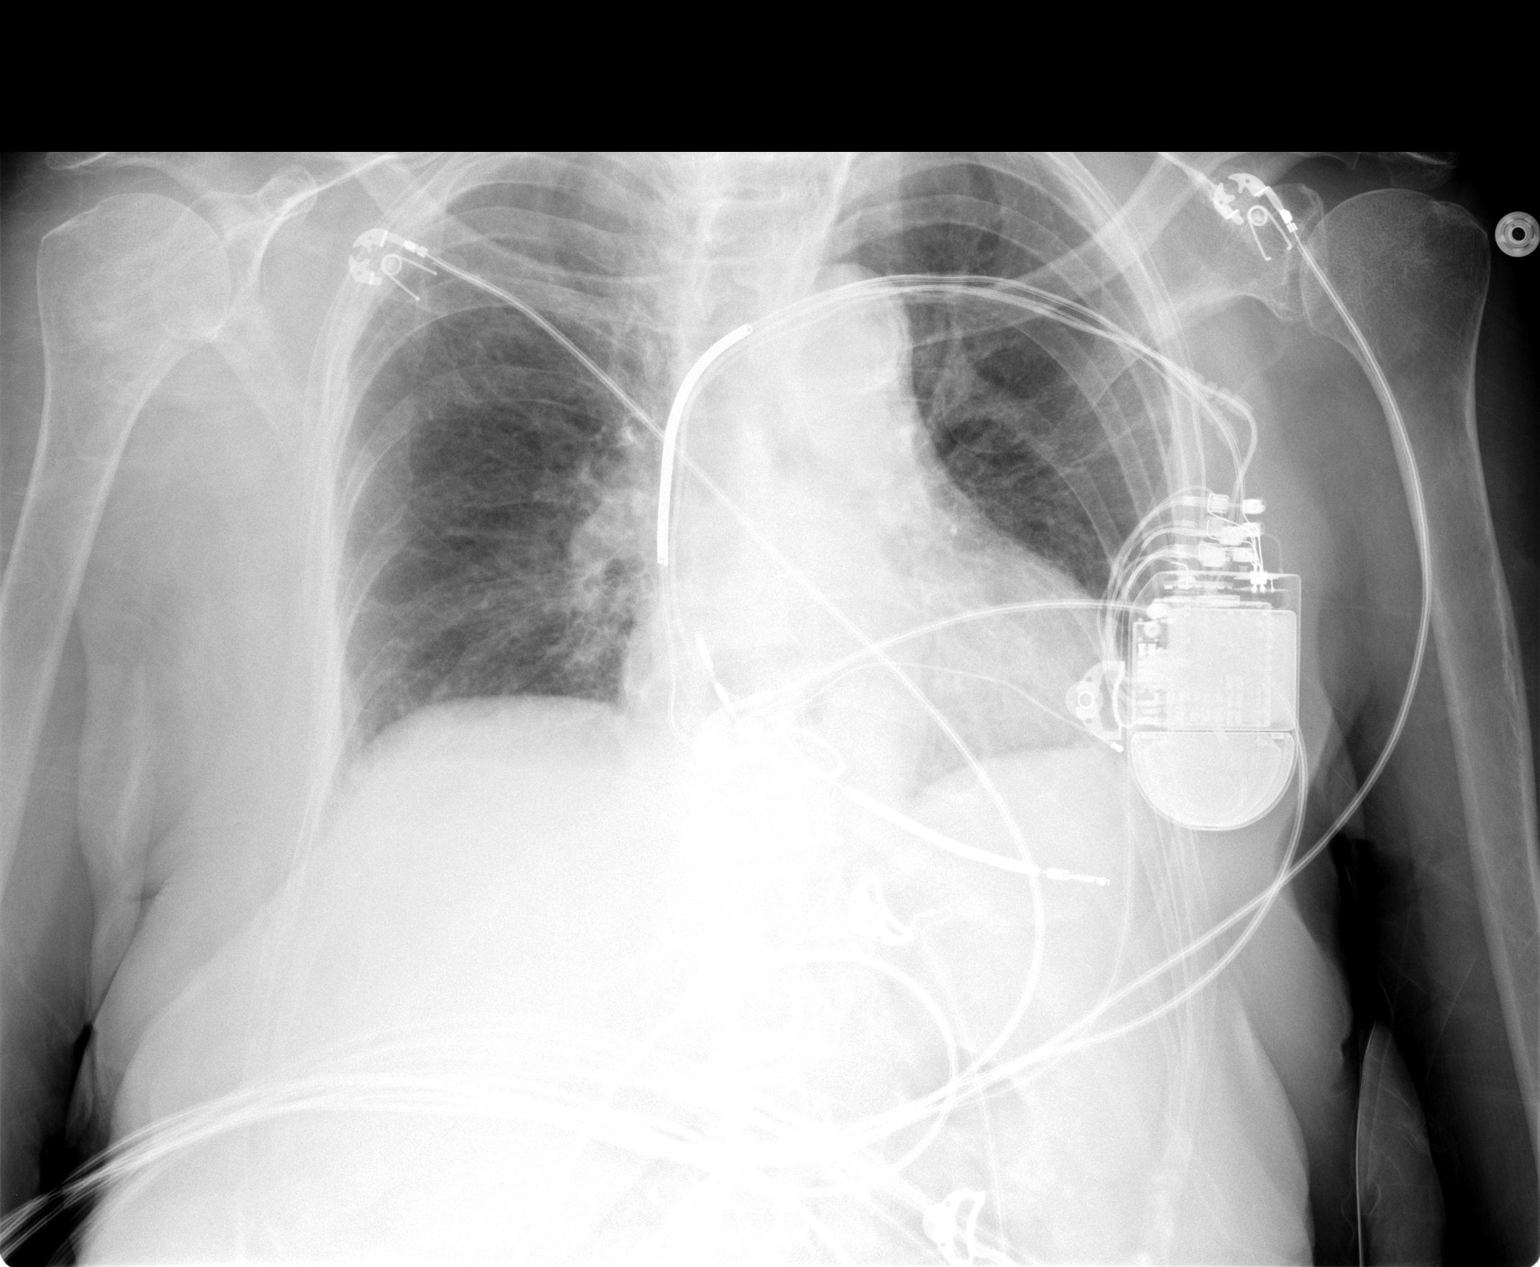

[1 of 1 positions shown; findings below may reference images not displayed]

FINDINGS: Chronic interstitial markings. Possible scarring in the left upper
lobe. No focal consolidation. No pleural effusion or pneumothorax.

The heart is normal in size.

Left subclavian ICD.

Degenerative changes of the visualized thoracolumbar spine. Known
sclerotic vertebral body at T11 is better visualized on CT.
IMPRESSION: No evidence of acute cardiopulmonary disease.

## 2014-06-11 ENCOUNTER — Other Ambulatory Visit: Payer: Self-pay | Admitting: *Deleted

## 2014-06-11 DIAGNOSIS — M25569 Pain in unspecified knee: Secondary | ICD-10-CM

## 2014-06-11 DIAGNOSIS — M159 Polyosteoarthritis, unspecified: Secondary | ICD-10-CM

## 2014-06-11 DIAGNOSIS — R269 Unspecified abnormalities of gait and mobility: Secondary | ICD-10-CM

## 2014-06-11 DIAGNOSIS — M6281 Muscle weakness (generalized): Secondary | ICD-10-CM

## 2014-06-11 MED ORDER — FENTANYL 25 MCG/HR TD PT72: MEDICATED_PATCH | TRANSDERMAL | Status: DC

## 2014-06-11 NOTE — Telephone Encounter (Signed)
Patient daughter, Nevin Bloodgood Requested and will pick up

## 2014-06-20 ENCOUNTER — Encounter: Payer: Self-pay | Admitting: Internal Medicine

## 2014-06-20 ENCOUNTER — Ambulatory Visit (INDEPENDENT_AMBULATORY_CARE_PROVIDER_SITE_OTHER): Payer: Medicare HMO | Admitting: Internal Medicine

## 2014-06-20 ENCOUNTER — Telehealth: Payer: Self-pay | Admitting: *Deleted

## 2014-06-20 VITALS — BP 120/62 | HR 77 | Temp 97.8°F | Resp 20 | Ht 66.0 in | Wt 123.6 lb

## 2014-06-20 DIAGNOSIS — F0151 Vascular dementia with behavioral disturbance: Secondary | ICD-10-CM

## 2014-06-20 DIAGNOSIS — F0152 Vascular dementia, unspecified severity, with psychotic disturbance: Secondary | ICD-10-CM

## 2014-06-20 DIAGNOSIS — F0150 Vascular dementia without behavioral disturbance: Secondary | ICD-10-CM

## 2014-06-20 DIAGNOSIS — F22 Delusional disorders: Secondary | ICD-10-CM

## 2014-06-20 DIAGNOSIS — K440 Diaphragmatic hernia with obstruction, without gangrene: Secondary | ICD-10-CM | POA: Insufficient documentation

## 2014-06-20 DIAGNOSIS — R634 Abnormal weight loss: Secondary | ICD-10-CM

## 2014-06-20 DIAGNOSIS — R627 Adult failure to thrive: Secondary | ICD-10-CM

## 2014-06-20 DIAGNOSIS — K224 Dyskinesia of esophagus: Secondary | ICD-10-CM

## 2014-06-20 NOTE — Progress Notes (Signed)
Patient ID: [name] [name], female   DOB: Nov 04, 1924, 81 y.o.   MRN: [id number]   Location:  [facility]  Code Status: DNR, has living will and hcpoa on record   Allergies  Allergen Reactions  . Ace Inhibitors Cough  . Requip [Ropinirole Hcl] Other (See Comments)    confusion    Chief Complaint  Patient presents with  . Medical Management of Chronic Issues    having some pain in her chest when she eats,    HPI: Patient is a 81 y.o. white female seen in the office today for management of medical problems.   Has lost more weight. States she doesn't eat a lot. Has some pain when she swallows, and feels like it is stuck in her throat and causes pain. Cereal and a banana goes down better at breakfast.   Saw GI, had a barium swallow, was told, the only thing they could do is surgery and she isn't a candidate for surgery.   No other problems since she was here last.   Has fallen once at home since last visit, she slipped off the side of the bed, but no injuries.   Has had firm and hard bowel movements, is taking 2 senokots every day and has Miralax that can be used as needed.   She was confused last week, and a urine sample was collected. Results are unknown. Confusion has resolved and daughter feels she is at baseline. Sometimes she sees bugs on the floor.   She received a prescription for a specialized wheelchair last visit, but the [facility] supply is not in network. [employer] [facility] is in her network.   Pt is full code currently, has Advanced Directives, daughter desires DNR, and patient agrees.  Review of Systems:  Review of Systems  Constitutional: Positive for malaise/fatigue. Negative for fever and chills.  Respiratory: Negative for cough, shortness of breath and wheezing.   Cardiovascular: Negative for chest pain, palpitations, orthopnea and leg swelling.  Gastrointestinal: Positive for constipation. Negative for nausea, vomiting,  abdominal pain and diarrhea.  Genitourinary: Negative for dysuria, urgency and frequency.  Skin: Negative for rash.  Neurological: Negative for dizziness and tremors.     Past Medical History  Diagnosis Date  . Atrial fibrillation   . BRONCHITIS, CHRONIC   . CHEST PAIN   . HYPOKALEMIA   . OBSTRUCTIVE SLEEP APNEA   . Palpitations   . SYSTOLIC HEART FAILURE, CHRONIC   . Depression   . CPAP (continuous positive airway pressure) dependence   . ACE-inhibitor cough   . Cardiac defibrillator in situ   . Chronic renal insufficiency   . GERD (gastroesophageal reflux disease)   . Hypothyroidism   . Hematuria   . Osteoarthritis (arthritis due to wear and tear of joints)   . Colon polyps   . Chronic rhinitis   . RLS (restless legs syndrome)   . Chronic back pain   . Arthritis   . Glaucoma   . Sleep apnea   . CHF (congestive heart failure)   . Dementia   . H/O colonoscopy 2005  . Anxiety   . Kidney disease   . Thyroid disease   . Generalized weakness   . Falls     Past Surgical History  Procedure Laterality Date  . Back surgery  1973  . Cardiac defibrillator placement  2005     [place] [phone]  . Skin cancer excision    . Cardiac catheterization  07/29/2003     Congestive heart failure, questionable etiology.  Increased LVEDP of 73mmHg on cath --  Nonobstructive coronary disease --         . Insert / replace / remove pacemaker  01/10/2004    Aborted biventricular implantable cardiac defibrillator implantation, complicated by probable pneumothorax  --  Deboraha Sprang, M.D.  . Cardiac defibrillator placement  01/15/2004    St. Jude 1581 65 cm dual coil active fixation defibrillator lead, serial number [id number] --  St. Jude Atlas +HF model V340CRT defibrillator, serial [id number]. -- Biventricular implantable cardioverter-defibrillator implantation, contrast venography and intraoperative defibrillation threshold testing -- SURGEON:  Deboraha Sprang, M.D.  . Implantable  cardioverter defibrillator implant  03/17/2009    generator change  --  Unify St. Jude ICD model O6296183, serial number [id number]  -- Deboraha Sprang, MD, [facility]   . Femur fracture surgery  2010  . Esophagogastroduodenoscopy  04/13/2011    Procedure: ESOPHAGOGASTRODUODENOSCOPY (EGD);  Surgeon: Zenovia Jarred, MD;  Location: [facility];  Service: Gastroenterology;  Laterality: N/A;  may need dilation    Social History:   reports that she quit smoking about 11 years ago. Her smoking use included Cigarettes. She smoked 1.00 pack per day. She has never used smokeless tobacco. She reports that she does not drink alcohol or use illicit drugs.  Family History  Problem Relation Age of Onset  . Heart disease Mother   . Colon cancer Mother   . Colon polyps Mother   . Kidney disease Mother   . Emphysema Father   . Liver disease Brother   . Kidney disease Maternal Grandmother   . Breast cancer Maternal Aunt   . Heart disease Father     Medications: Patient's Medications  New Prescriptions   No medications on file  Previous Medications   ALLOPURINOL (ZYLOPRIM) 100 MG TABLET    Take 1 tablet by mouth daily for Gout   AMBULATORY NON FORMULARY MEDICATION    Wheelchair and Cushion. Patient is unable to propel herself-she is nonambulatory   ASCORBIC ACID (VITAMIN C) 500 MG TABLET    Take 500 mg by mouth daily.     ASPIRIN EC 81 MG TABLET    Take 1 tablet (81 mg total) by mouth daily.   CALCIUM CARB-CHOLECALCIFEROL (OYSTER SHELL CALCIUM + D PO)    Take by mouth. 1 by mouth four times daily with meals   CARVEDILOL (COREG) 12.5 MG TABLET    Take 1/2 tablet by mouth twice daily   CRANBERRY-VITAMIN C-INULIN (UTI-STAT) LIQD    30 mLs. Taking 30 ml by mouth every morning   D3-50 50000 UNITS CAPSULE    TAKE ONE CAPSULE BY MOUTH ONCE WEEKLY ON WEDNESDAYS   FENTANYL (DURAGESIC - DOSED MCG/HR) 25 MCG/HR PATCH    Place 1 patch onto the skin every 3 days for pain   FUROSEMIDE (LASIX) 40 MG TABLET    Take 1 tablet by  mouth 2 times daily for Edema   HYDRALAZINE (APRESOLINE) 10 MG TABLET    Take 1 tablet by mouth 3 times daily, breakfast, 2:00 pm and bedtime for BP   HYDROCODONE-ACETAMINOPHEN (NORCO/VICODIN) 5-325 MG PER TABLET    Take one tablet by mouth every 6 hours as needed for leg pain   ISOSORBIDE MONONITRATE (IMDUR) 30 MG 24 HR TABLET    Take 1 tablet by mouth daily for BP   KLOR-CON M20 20 MEQ TABLET    TAKE 2 TABLETS BY MOUTH 2 TIMES DAILY FOR  HYPOKALEMIA   LEVOTHYROXINE (SYNTHROID, LEVOTHROID) 88 MCG TABLET    Take one tablet by mouth 30 minutes before breakfast for thyroid.   PANTOPRAZOLE (PROTONIX) 40 MG TABLET    Take 1 tablet by mouth 2 times daily before a meal for GERD   POLYETHYLENE GLYCOL (MIRALAX / GLYCOLAX) PACKET    Take 17 g by mouth as needed.    POLYVINYL ALCOHOL (LIQUIFILM TEARS) 1.4 % OPHTHALMIC SOLUTION    Place 1 drop into both eyes 3 (three) times daily.   QUETIAPINE (SEROQUEL) 25 MG TABLET    Take 1 tablet (25 mg total) by mouth at bedtime.   QUETIAPINE (SEROQUEL) 25 MG TABLET    TAKE 1 TABLET (25 MG TOTAL) BY MOUTH AT BEDTIME.   RISPERIDONE (RISPERDAL) 0.5 MG TABLET    TAKE ONE TABLET BY MOUTH EVERY 6 HOURS AS NEEDED FOR AGITATION.   SENNA-DOCUSATE (SENOKOT-S) 8.6-50 MG PER TABLET    Take 2 tablets by mouth daily. Hold for loose stools   SERTRALINE (ZOLOFT) 100 MG TABLET    Take 1 tablet by mouth daily for Depression   WHITE PETROLATUM-MINERAL OIL (ARTIFICIAL TEARS) OINT OPHTHALMIC OINTMENT    Place 1 application into both eyes at bedtime.  Modified Medications   No medications on file  Discontinued Medications   No medications on file     Physical Exam: Filed Vitals:   06/20/14 1041  BP: 120/62  Pulse: 77  Temp: 97.8 F (36.6 C)  TempSrc: Oral  Resp: 20  Height: 5\' 6"  (1.676 m)  Weight: 123 lb 9.6 oz (56.065 kg)  SpO2: 97%   Physical Exam  Cardiovascular: Normal rate, regular rhythm and intact distal pulses.   Murmur heard. Pulmonary/Chest: Effort normal and  breath sounds normal.  Abdominal: Soft. Bowel sounds are normal.  Neurological: She is alert.  Oriented to person and place  Skin: Skin is warm and dry.  Psychiatric: She has a normal mood and affect.  Nursing note and vitals reviewed.   Labs reviewed: Basic Metabolic Panel:  Recent Labs  08/20/13 1101 12/06/13 1656 12/27/13 1545 03/21/14 1008  NA 141 140 138 140  K 4.1 4.3 4.2 4.2  CL 98 97 98 100  CO2 24 24 24 25   GLUCOSE 109* 105* 97 100*  BUN 18 31* 29* 23  CREATININE 1.54* 1.78* 1.35* 1.41*  CALCIUM 10.5* 9.4 9.4 9.9  TSH 2.370 0.463  --   --    Liver Function Tests:  Recent Labs  07/29/13 1159 08/12/13 2151 08/20/13 1101 12/06/13 1656  AST 24 19 24 22   ALT 11 9 11 10   ALKPHOS 133* 150* 147* 114  BILITOT 0.7 0.8 0.7 0.7  PROT 7.5 7.4 6.8 6.7  ALBUMIN 3.5 3.6  --   --    No results for input(s): LIPASE, AMYLASE in the last 8760 hours. No results for input(s): AMMONIA in the last 8760 hours. CBC:  Recent Labs  12/06/13 1656 12/27/13 1545 03/21/14 1008  WBC 7.2 5.1 7.6  NEUTROABS 5.1 3.1 5.6  HGB 9.9* 9.1* 10.5*  HCT 29.6* 28.3* 32.4*  MCV 91 94 92  PLT 130* 115* 125*   Assessment/Plan 1. Vascular dementia with paranoia - continues on seroquel 25mg  po bid and risperdal prn for her psychosis which does continue, is on zoloft for depression, as well - DNR (Do Not Resuscitate)  2. Loss of weight -multifactorial--dementia, hiatal hernia with polyp/mass present interfering with normal food passage -changes made to diet  3. Adult failure to  thrive -due to her dementia, depression, and hiatal hernia -given a sample of butter pecan supplement to try today (does not like vanilla or chocolate)  4. Hiatal hernia with obstruction but no gangrene -also had some esophageal dysmotility -encouraged two small snacks in addition to her three meals per day -diet changed to mechanical soft with ground meats -copy of report from Dr. Hilarie Fredrickson on diagnostic  esophagram was sent to [facility] so they are aware -speech therapy order written  5.  Esophageal dysmotility -same as above  DNR form done FL-2 and MAR signed  Labs/tests ordered:  No new labs today Next appt:  3 mos  Sheila Ocasio L. Emanuella Nickle, D.O. [facility] [address], [place] [address] Cell Phone (Mon-Fri 8am-5pm):  [phone] On Call:  [phone] & follow prompts after 5pm & weekends Office Phone:  [phone] Office Fax:  [phone]

## 2014-06-20 NOTE — Patient Instructions (Signed)
You have a blockage at the entrance of your stomach.  Eat soft foods.  Eat more frequently.  When you take a small bite of food, always take a sip of juice or water after each bite.  This will help.    Hiatal Hernia A hiatal hernia occurs when part of your stomach slides above the muscle that separates your abdomen from your chest (diaphragm). You can be born with a hiatal hernia (congenital), or it may develop over time. In almost all cases of hiatal hernia, only the top part of the stomach pushes through.  Many people have a hiatal hernia with no symptoms. The larger the hernia, the more likely that you will have symptoms. In some cases, a hiatal hernia allows stomach acid to flow back into the tube that carries food from your mouth to your stomach (esophagus). This may cause heartburn symptoms. Severe heartburn symptoms may mean you have developed a condition called gastroesophageal reflux disease (GERD).  CAUSES  Hiatal hernias are caused by a weakness in the opening (hiatus) where your esophagus passes through your diaphragm to attach to the upper part of your stomach. You may be born with a weakness in your hiatus, or a weakness can develop. RISK FACTORS Older age is a major risk factor for a hiatal hernia. Anything that increases pressure on your diaphragm can also increase your risk of a hiatal hernia. This includes:  Pregnancy.  Excess weight.  Frequent constipation. SIGNS AND SYMPTOMS  People with a hiatal hernia often have no symptoms. If symptoms develop, they are almost always caused by GERD. They may include:  Heartburn.  Belching.  Indigestion.  Trouble swallowing.  Coughing or wheezing.  Sore throat.  Hoarseness.  Chest pain. DIAGNOSIS  A hiatal hernia is sometimes found during an exam for another problem. Your health care provider may suspect a hiatal hernia if you have symptoms of GERD. Tests may be done to diagnose GERD. These may include:  X-rays of your  stomach or chest.  An upper gastrointestinal (GI) series. This is an X-ray exam of your GI tract involving the use of a chalky liquid that you swallow. The liquid shows up clearly on the X-ray.  Endoscopy. This is a procedure to look into your stomach using a thin, flexible tube that has a tiny camera and light on the end of it. TREATMENT  If you have no symptoms, you may not need treatment. If you have symptoms, treatment may include:  Dietary and lifestyle changes to help reduce GERD symptoms.  Medicines. These may include:  Over-the-counter antacids.  Medicines that make your stomach empty more quickly.  Medicines that block the production of stomach acid (H2 blockers).  Stronger medicines to reduce stomach acid (proton pump inhibitors).  You may need surgery to repair the hernia if other treatments are not helping. HOME CARE INSTRUCTIONS   Take all medicines as directed by your health care provider.  Quit smoking, if you smoke.  Try to achieve and maintain a healthy body weight.  Eat frequent small meals instead of three large meals a day. This keeps your stomach from getting too full.  Eat slowly.  Do not lie down right after eating.  Do noteat 1-2 hours before bed.   Do not drink beverages with caffeine. These include cola, coffee, cocoa, and tea.  Do not drink alcohol.  Avoid foods that can make symptoms of GERD worse. These may include:  Fatty foods.  Citrus fruits.  Other foods and drinks  that contain acid.  Avoid putting pressure on your belly. Anything that puts pressure on your belly increases the amount of acid that may be pushed up into your esophagus.   Avoid bending over, especially after eating.  Raise the head of your bed by putting blocks under the legs. This keeps your head and esophagus higher than your stomach.  Do not wear tight clothing around your chest or stomach.  Try not to strain when having a bowel movement, when urinating, or  when lifting heavy objects. SEEK MEDICAL CARE IF:  Your symptoms are not controlled with medicines or lifestyle changes.  You are having trouble swallowing.  You have coughing or wheezing that will not go away. SEEK IMMEDIATE MEDICAL CARE IF:  Your pain is getting worse.  Your pain spreads to your arms, neck, jaw, teeth, or back.  You have shortness of breath.  You sweat for no reason.  You feel sick to your stomach (nauseous) or vomit.  You vomit blood.  You have bright red blood in your stools.  You have black, tarry stools.  Document Released: 07/17/2003 Document Revised: 09/10/2013 Document Reviewed: 04/13/2013 [facility] Patient Information 2015 [place], [place]. This information is not intended to replace advice given to you by your health care provider. Make sure you discuss any questions you have with your health care provider.

## 2014-06-20 NOTE — Telephone Encounter (Signed)
Received U/A and Culture from [facility]. Given to Dr. Mariea Clonts to review and sign.

## 2014-06-26 ENCOUNTER — Encounter (HOSPITAL_COMMUNITY): Payer: Self-pay | Admitting: Emergency Medicine

## 2014-06-26 ENCOUNTER — Emergency Department (HOSPITAL_COMMUNITY)
Admission: EM | Admit: 2014-06-26 | Discharge: 2014-06-27 | Disposition: A | Discharge status: Home / Self Care | Payer: Medicare HMO | Attending: Emergency Medicine | Admitting: Emergency Medicine

## 2014-06-26 DIAGNOSIS — M1990 Unspecified osteoarthritis, unspecified site: Secondary | ICD-10-CM | POA: Insufficient documentation

## 2014-06-26 DIAGNOSIS — Z8669 Personal history of other diseases of the nervous system and sense organs: Secondary | ICD-10-CM | POA: Diagnosis not present

## 2014-06-26 DIAGNOSIS — Z9981 Dependence on supplemental oxygen: Secondary | ICD-10-CM | POA: Diagnosis not present

## 2014-06-26 DIAGNOSIS — K219 Gastro-esophageal reflux disease without esophagitis: Secondary | ICD-10-CM | POA: Insufficient documentation

## 2014-06-26 DIAGNOSIS — I5022 Chronic systolic (congestive) heart failure: Secondary | ICD-10-CM | POA: Insufficient documentation

## 2014-06-26 DIAGNOSIS — Y9389 Activity, other specified: Secondary | ICD-10-CM | POA: Diagnosis not present

## 2014-06-26 DIAGNOSIS — Z8709 Personal history of other diseases of the respiratory system: Secondary | ICD-10-CM | POA: Diagnosis not present

## 2014-06-26 DIAGNOSIS — F0390 Unspecified dementia without behavioral disturbance: Secondary | ICD-10-CM | POA: Insufficient documentation

## 2014-06-26 DIAGNOSIS — Z7982 Long term (current) use of aspirin: Secondary | ICD-10-CM | POA: Diagnosis not present

## 2014-06-26 DIAGNOSIS — Z87891 Personal history of nicotine dependence: Secondary | ICD-10-CM | POA: Diagnosis not present

## 2014-06-26 DIAGNOSIS — Y9289 Other specified places as the place of occurrence of the external cause: Secondary | ICD-10-CM | POA: Insufficient documentation

## 2014-06-26 DIAGNOSIS — Z86010 Personal history of colonic polyps: Secondary | ICD-10-CM | POA: Insufficient documentation

## 2014-06-26 DIAGNOSIS — Z9889 Other specified postprocedural states: Secondary | ICD-10-CM | POA: Insufficient documentation

## 2014-06-26 DIAGNOSIS — S0990XA Unspecified injury of head, initial encounter: Secondary | ICD-10-CM | POA: Diagnosis present

## 2014-06-26 DIAGNOSIS — F419 Anxiety disorder, unspecified: Secondary | ICD-10-CM | POA: Diagnosis not present

## 2014-06-26 DIAGNOSIS — W19XXXA Unspecified fall, initial encounter: Secondary | ICD-10-CM

## 2014-06-26 DIAGNOSIS — F329 Major depressive disorder, single episode, unspecified: Secondary | ICD-10-CM | POA: Insufficient documentation

## 2014-06-26 DIAGNOSIS — S0511XA Contusion of eyeball and orbital tissues, right eye, initial encounter: Secondary | ICD-10-CM | POA: Insufficient documentation

## 2014-06-26 DIAGNOSIS — E039 Hypothyroidism, unspecified: Secondary | ICD-10-CM | POA: Diagnosis not present

## 2014-06-26 DIAGNOSIS — Z87448 Personal history of other diseases of urinary system: Secondary | ICD-10-CM | POA: Diagnosis not present

## 2014-06-26 DIAGNOSIS — W06XXXA Fall from bed, initial encounter: Secondary | ICD-10-CM | POA: Insufficient documentation

## 2014-06-26 DIAGNOSIS — Y998 Other external cause status: Secondary | ICD-10-CM | POA: Insufficient documentation

## 2014-06-26 DIAGNOSIS — Z79899 Other long term (current) drug therapy: Secondary | ICD-10-CM | POA: Insufficient documentation

## 2014-06-26 NOTE — ED Notes (Signed)
Pt presents to ED via EMS for fall. Facility reports that pt found on the floor, pt reports she fell off bed trying to reach for something. Hematoma noted lateral right eye, frontal, back of the head.

## 2014-06-27 NOTE — ED Provider Notes (Signed)
CSN: [id number]     Arrival date & time 06/26/14  2255 History   First MD Initiated Contact with Patient 06/26/14 2306     Chief Complaint  Patient presents with  . Fall     (Consider location/radiation/quality/duration/timing/severity/associated sxs/prior Treatment) HPI .[name]... Level V caveat for mild dementia.  Accidental fall from bed tonight striking her right side of the head. No loss of consciousness or neurological deficits. No neck pain. No hip, extremity, truncal pain. Daughter reports normal behavior. Patient has no specific complaints at all.  Past Medical History  Diagnosis Date  . Atrial fibrillation   . BRONCHITIS, CHRONIC   . CHEST PAIN   . HYPOKALEMIA   . OBSTRUCTIVE SLEEP APNEA   . Palpitations   . SYSTOLIC HEART FAILURE, CHRONIC   . Depression   . CPAP (continuous positive airway pressure) dependence   . ACE-inhibitor cough   . Cardiac defibrillator in situ   . Chronic renal insufficiency   . GERD (gastroesophageal reflux disease)   . Hypothyroidism   . Hematuria   . Osteoarthritis (arthritis due to wear and tear of joints)   . Colon polyps   . Chronic rhinitis   . RLS (restless legs syndrome)   . Chronic back pain   . Arthritis   . Glaucoma   . Sleep apnea   . CHF (congestive heart failure)   . Dementia   . H/O colonoscopy 2005  . Anxiety   . Kidney disease   . Thyroid disease   . Generalized weakness   . Falls    Past Surgical History  Procedure Laterality Date  . Back surgery  1973  . Cardiac defibrillator placement  2005     [place] [phone]  . Skin cancer excision    . Cardiac catheterization  07/29/2003     Congestive heart failure, questionable etiology.  Increased LVEDP of 16mmHg on cath --  Nonobstructive coronary disease --         . Insert / replace / remove pacemaker  01/10/2004    Aborted biventricular implantable cardiac defibrillator implantation, complicated by probable pneumothorax  --  Deboraha Sprang, M.D.  . Cardiac  defibrillator placement  01/15/2004    St. Jude 1581 65 cm dual coil active fixation defibrillator lead, serial number [id number] --  St. Jude Atlas +HF model V340CRT defibrillator, serial [id number]. -- Biventricular implantable cardioverter-defibrillator implantation, contrast venography and intraoperative defibrillation threshold testing -- SURGEON:  Deboraha Sprang, M.D.  . Implantable cardioverter defibrillator implant  03/17/2009    generator change  --  Unify St. Jude ICD model O6296183, serial number [id number]  -- Deboraha Sprang, MD, [facility]   . Femur fracture surgery  2010  . Esophagogastroduodenoscopy  04/13/2011    Procedure: ESOPHAGOGASTRODUODENOSCOPY (EGD);  Surgeon: Zenovia Jarred, MD;  Location: [facility];  Service: Gastroenterology;  Laterality: N/A;  may need dilation   Family History  Problem Relation Age of Onset  . Heart disease Mother   . Colon cancer Mother   . Colon polyps Mother   . Kidney disease Mother   . Emphysema Father   . Liver disease Brother   . Kidney disease Maternal Grandmother   . Breast cancer Maternal Aunt   . Heart disease Father    History  Substance Use Topics  . Smoking status: Former Smoker -- 1.00 packs/day    Types: Cigarettes    Quit date: 04/10/2003  . Smokeless tobacco: Never Used  . Alcohol Use: No  OB History    No data available     Review of Systems  Unable to perform ROS: Dementia      Allergies  Ace inhibitors and Requip  Home Medications   Prior to Admission medications   Medication Sig Start Date End Date Taking? Authorizing Provider  allopurinol (ZYLOPRIM) 100 MG tablet Take 1 tablet by mouth daily for Gout 02/18/14  Yes Lauree Chandler, NP  Ascorbic Acid (VITAMIN C) 500 MG tablet Take 500 mg by mouth daily.     Yes Historical Provider, MD  aspirin EC 81 MG tablet Take 1 tablet (81 mg total) by mouth daily. 10/14/11  Yes Deboraha Sprang, MD  Calcium Carb-Cholecalciferol (OYSTER SHELL CALCIUM + D PO) Take by mouth. 1 by mouth  four times daily with meals   Yes Historical Provider, MD  carvedilol (COREG) 12.5 MG tablet Take 1/2 tablet by mouth twice daily Patient taking differently: Take 6.25 mg by mouth 2 (two) times daily with a meal.  02/18/14  Yes Lauree Chandler, NP  Cranberry-Vitamin C-Inulin (UTI-STAT) LIQD 30 mLs. Taking 30 ml by mouth every morning   Yes Historical Provider, MD  D3-50 50000 UNITS capsule TAKE ONE CAPSULE BY MOUTH ONCE WEEKLY ON [facility] 04/12/14  Yes [name] L Reed, DO  fentaNYL ([place] - DOSED MCG/HR) 25 MCG/HR patch Place 1 patch onto the skin every 3 days for pain 06/11/14  Yes Estill Dooms, MD  furosemide (LASIX) 40 MG tablet Take 1 tablet by mouth 2 times daily for Edema 02/18/14  Yes Lauree Chandler, NP  hydrALAZINE (APRESOLINE) 10 MG tablet Take 1 tablet by mouth 3 times daily, breakfast, 2:00 pm and bedtime for BP 02/18/14  Yes Lauree Chandler, NP  HYDROcodone-acetaminophen (NORCO/VICODIN) 5-325 MG per tablet Take one tablet by mouth every 6 hours as needed for leg pain 02/28/14  Yes [name] L Reed, DO  isosorbide mononitrate (IMDUR) 30 MG 24 hr tablet Take 1 tablet by mouth daily for BP 02/18/14  Yes Lauree Chandler, NP  KLOR-CON M20 20 MEQ tablet TAKE 2 TABLETS BY MOUTH 2 TIMES DAILY FOR HYPOKALEMIA 06/03/14  Yes [name] L Reed, DO  levothyroxine (SYNTHROID, LEVOTHROID) 88 MCG tablet Take one tablet by mouth 30 minutes before breakfast for thyroid. 02/25/14  Yes [name] L Reed, DO  pantoprazole (PROTONIX) 40 MG tablet Take 1 tablet by mouth 2 times daily before a meal for GERD 05/16/14  Yes [name] L Reed, DO  polyethylene glycol (MIRALAX / GLYCOLAX) packet Take 17 g by mouth daily as needed for mild constipation.    Yes Historical Provider, MD  polyvinyl alcohol (LIQUIFILM TEARS) 1.4 % ophthalmic solution Place 1 drop into both eyes 3 (three) times daily. 08/20/13  Yes [name] L Reed, DO  QUEtiapine (SEROQUEL) 50 MG tablet Take 50 mg by mouth at bedtime.   Yes Historical Provider,  MD  risperiDONE (RISPERDAL) 0.5 MG tablet TAKE ONE TABLET BY MOUTH EVERY 6 HOURS AS NEEDED FOR AGITATION. 04/15/14  Yes [name] L Reed, DO  senna-docusate (SENOKOT-S) 8.6-50 MG per tablet Take 2 tablets by mouth daily. Hold for loose stools 03/21/14  Yes [name] L Reed, DO  sertraline (ZOLOFT) 100 MG tablet Take 1 tablet by mouth daily for Depression 02/18/14  Yes Lauree Chandler, NP  AMBULATORY NON FORMULARY MEDICATION Wheelchair and Cushion. Patient is unable to propel herself-she is nonambulatory 05/28/14   [name] L Reed, DO  QUEtiapine (SEROQUEL) 25 MG tablet Take 1 tablet (25 mg total)  by mouth at bedtime. Patient not taking: Reported on 06/26/2014 04/08/14   [name] L Reed, DO  QUEtiapine (SEROQUEL) 25 MG tablet TAKE 1 TABLET (25 MG TOTAL) BY MOUTH AT BEDTIME. Patient not taking: Reported on 06/26/2014 05/02/14   Gayland Curry, DO  White Petrolatum-Mineral Oil (ARTIFICIAL TEARS) OINT ophthalmic ointment Place 1 application into both eyes at bedtime. Patient not taking: Reported on 06/26/2014 08/20/13   [name] L Reed, DO   BP 121/57 mmHg  Pulse 74  Temp(Src) 98 F (36.7 C) (Oral)  Resp 20  SpO2 97% Physical Exam  Constitutional: She is oriented to person, place, and time.  Pleasant, alert  HENT:  Head: Normocephalic.  2 small areas of ecchymosis superior and lateral to the right orbit  Eyes: Conjunctivae and EOM are normal. Pupils are equal, round, and reactive to light.  Neck: Normal range of motion. Neck supple.  Cardiovascular: Normal rate and regular rhythm.   Pulmonary/Chest: Effort normal and breath sounds normal.  Abdominal: Soft. Bowel sounds are normal.  Musculoskeletal: Normal range of motion.  Neurological: She is alert and oriented to person, place, and time.  Skin: Skin is warm and dry.  Psychiatric: She has a normal mood and affect. Her behavior is normal.  Nursing note and vitals reviewed.   ED Course  Procedures (including critical care time) Labs  Review Labs Reviewed - No data to display  Imaging Review No results found.   EKG Interpretation None      MDM   Final diagnoses:  Fall  Orbital contusion, right, initial encounter    Patient was observed for 2 hours with no change in behavior. She has 2 small areas of ecchymosis on her right lateral forehead.   Daughter reports normal behavior. No imaging necessary.    Nat Christen, MD 06/27/14 [id number]

## 2014-06-27 NOTE — ED Notes (Signed)
Pt made aware to return if symptoms worsen or if any life threatening symptoms occur.   

## 2014-06-27 NOTE — Discharge Instructions (Signed)
Ice pack, Tylenol, you will be black and blue for a couple of weeks. Return if worse

## 2014-06-27 NOTE — ED Notes (Addendum)
Pt from nursing home reports slipping out of bed and hitting head when trying to transfer to wheelchair.  Pt alert and oriented and denies pain at this time.  Pt able to move all extremities.  Pt has hematoma to right forehead and temple with a superficial laceration under right eye.

## 2014-06-27 NOTE — ED Notes (Signed)
Pt and family left paperwork including DNR and medication sheet.  Pt sticker placed on top sheet and is stored at the nurse first desk of [facility].  Notified Cyndee Brightly, medtech at [facility] that papers were left.  Cyndee Brightly verbalized that she would notify family to come pick the papers up.

## 2014-07-09 ENCOUNTER — Other Ambulatory Visit: Payer: Self-pay | Admitting: *Deleted

## 2014-07-09 DIAGNOSIS — M25569 Pain in unspecified knee: Secondary | ICD-10-CM

## 2014-07-09 MED ORDER — FENTANYL 25 MCG/HR TD PT72: MEDICATED_PATCH | TRANSDERMAL | Status: DC

## 2014-07-09 NOTE — Telephone Encounter (Signed)
Patient caregiver, Nevin Bloodgood called and requested Rx and will pick up. Also stated that Huey Romans will be faxing forms requesting additional information regarding patient's mobility.

## 2014-07-11 ENCOUNTER — Telehealth: Payer: Self-pay | Admitting: *Deleted

## 2014-07-11 NOTE — Telephone Encounter (Signed)
[name] [name],daughter called and stated that [employer] needs some more information regarding the wheelchair ordered. Daughter is going to fax me a list of what they need and a fax number.

## 2014-07-12 ENCOUNTER — Other Ambulatory Visit: Payer: Self-pay | Admitting: Internal Medicine

## 2014-07-12 NOTE — Telephone Encounter (Signed)
Received fax from [facility]. Fax is from Kirstie Mirza from [facility] # [phone] Fax#: [phone]. Stated that we need to fax over notes that are a part of patient's permanent medical record. The notes need to indicate the patient's need for the equipment.  OV notes printed along with patient's demographic sheet and faxed to [facility] with Huey Romans. Patient daughter notified.

## 2014-07-13 ENCOUNTER — Emergency Department (HOSPITAL_COMMUNITY)
Admission: EM | Admit: 2014-07-13 | Discharge: 2014-07-13 | Disposition: A | Discharge status: Home / Self Care | Payer: Medicare HMO | Attending: Emergency Medicine | Admitting: Emergency Medicine

## 2014-07-13 ENCOUNTER — Emergency Department (HOSPITAL_COMMUNITY): Payer: Medicare HMO

## 2014-07-13 DIAGNOSIS — F329 Major depressive disorder, single episode, unspecified: Secondary | ICD-10-CM | POA: Insufficient documentation

## 2014-07-13 DIAGNOSIS — Z86010 Personal history of colonic polyps: Secondary | ICD-10-CM | POA: Insufficient documentation

## 2014-07-13 DIAGNOSIS — H409 Unspecified glaucoma: Secondary | ICD-10-CM | POA: Diagnosis not present

## 2014-07-13 DIAGNOSIS — E039 Hypothyroidism, unspecified: Secondary | ICD-10-CM | POA: Insufficient documentation

## 2014-07-13 DIAGNOSIS — M1990 Unspecified osteoarthritis, unspecified site: Secondary | ICD-10-CM | POA: Insufficient documentation

## 2014-07-13 DIAGNOSIS — F0391 Unspecified dementia with behavioral disturbance: Secondary | ICD-10-CM | POA: Diagnosis not present

## 2014-07-13 DIAGNOSIS — Z87891 Personal history of nicotine dependence: Secondary | ICD-10-CM | POA: Diagnosis not present

## 2014-07-13 DIAGNOSIS — I4891 Unspecified atrial fibrillation: Secondary | ICD-10-CM | POA: Diagnosis not present

## 2014-07-13 DIAGNOSIS — F419 Anxiety disorder, unspecified: Secondary | ICD-10-CM | POA: Diagnosis not present

## 2014-07-13 DIAGNOSIS — D72829 Elevated white blood cell count, unspecified: Secondary | ICD-10-CM | POA: Insufficient documentation

## 2014-07-13 DIAGNOSIS — G2581 Restless legs syndrome: Secondary | ICD-10-CM | POA: Diagnosis not present

## 2014-07-13 DIAGNOSIS — Z9981 Dependence on supplemental oxygen: Secondary | ICD-10-CM | POA: Insufficient documentation

## 2014-07-13 DIAGNOSIS — K219 Gastro-esophageal reflux disease without esophagitis: Secondary | ICD-10-CM | POA: Insufficient documentation

## 2014-07-13 DIAGNOSIS — R4182 Altered mental status, unspecified: Secondary | ICD-10-CM | POA: Diagnosis present

## 2014-07-13 DIAGNOSIS — N189 Chronic kidney disease, unspecified: Secondary | ICD-10-CM | POA: Diagnosis not present

## 2014-07-13 DIAGNOSIS — Z7982 Long term (current) use of aspirin: Secondary | ICD-10-CM | POA: Insufficient documentation

## 2014-07-13 DIAGNOSIS — Z79899 Other long term (current) drug therapy: Secondary | ICD-10-CM | POA: Insufficient documentation

## 2014-07-13 DIAGNOSIS — I5022 Chronic systolic (congestive) heart failure: Secondary | ICD-10-CM | POA: Insufficient documentation

## 2014-07-13 DIAGNOSIS — Z8709 Personal history of other diseases of the respiratory system: Secondary | ICD-10-CM | POA: Insufficient documentation

## 2014-07-13 DIAGNOSIS — G8929 Other chronic pain: Secondary | ICD-10-CM | POA: Diagnosis not present

## 2014-07-13 DIAGNOSIS — G4733 Obstructive sleep apnea (adult) (pediatric): Secondary | ICD-10-CM | POA: Diagnosis not present

## 2014-07-13 LAB — CBC WITH DIFFERENTIAL/PLATELET
BASOS PCT: 0 % (ref 0–1)
Basophils Absolute: 0 10*3/uL (ref 0.0–0.1)
Eosinophils Absolute: 0.1 10*3/uL (ref 0.0–0.7)
Eosinophils Relative: 2 % (ref 0–5)
HCT: 29.2 % — ABNORMAL LOW (ref 36.0–46.0)
Hemoglobin: 9 g/dL — ABNORMAL LOW (ref 12.0–15.0)
Lymphocytes Relative: 23 % (ref 12–46)
Lymphs Abs: 1.2 10*3/uL (ref 0.7–4.0)
MCH: 30.8 pg (ref 26.0–34.0)
MCHC: 30.8 g/dL (ref 30.0–36.0)
MCV: 100 fL (ref 78.0–100.0)
MONOS PCT: 7 % (ref 3–12)
Monocytes Absolute: 0.4 10*3/uL (ref 0.1–1.0)
Neutro Abs: 3.3 10*3/uL (ref 1.7–7.7)
Neutrophils Relative %: 68 % (ref 43–77)
PLATELETS: 85 10*3/uL — AB (ref 150–400)
RBC: 2.92 MIL/uL — ABNORMAL LOW (ref 3.87–5.11)
RDW: 16.4 % — ABNORMAL HIGH (ref 11.5–15.5)
WBC: 5 10*3/uL (ref 4.0–10.5)

## 2014-07-13 LAB — URINALYSIS, ROUTINE W REFLEX MICROSCOPIC
Bilirubin Urine: NEGATIVE
Glucose, UA: NEGATIVE mg/dL
Hgb urine dipstick: NEGATIVE
Ketones, ur: NEGATIVE mg/dL
NITRITE: NEGATIVE
PROTEIN: NEGATIVE mg/dL
Specific Gravity, Urine: 1.011 (ref 1.005–1.030)
Urobilinogen, UA: 0.2 mg/dL (ref 0.0–1.0)
pH: 7 (ref 5.0–8.0)

## 2014-07-13 LAB — BASIC METABOLIC PANEL
Anion gap: 9 (ref 5–15)
BUN: 30 mg/dL — ABNORMAL HIGH (ref 6–23)
CO2: 27 mmol/L (ref 19–32)
CREATININE: 1.62 mg/dL — AB (ref 0.50–1.10)
Calcium: 9.4 mg/dL (ref 8.4–10.5)
Chloride: 106 mmol/L (ref 96–112)
GFR calc non Af Amer: 27 mL/min — ABNORMAL LOW (ref >90)
GFR, EST AFRICAN AMERICAN: 32 mL/min — AB (ref >90)
GLUCOSE: 99 mg/dL (ref 70–99)
Potassium: 4.1 mmol/L (ref 3.5–5.1)
Sodium: 142 mmol/L (ref 135–145)

## 2014-07-13 LAB — URINE MICROSCOPIC-ADD ON

## 2014-07-13 NOTE — Discharge Instructions (Signed)

## 2014-07-13 NOTE — ED Notes (Signed)
Bed: CR75 Expected date: 07/13/14 Expected time: 9:48 AM Means of arrival: Ambulance Comments: Change in behavior

## 2014-07-13 NOTE — ED Notes (Signed)
Called [facility] to cancel transport request after daughter expressed she wants to transport instead
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
## 2014-07-13 NOTE — ED Notes (Signed)
Case management is currently still in room, will try to get urine once they leave.

## 2014-07-13 NOTE — ED Notes (Signed)
[facility] called for transport.  

## 2014-07-13 NOTE — ED Notes (Signed)
Patient transported to CT 
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
## 2014-07-15 LAB — URINE CULTURE

## 2014-07-17 ENCOUNTER — Other Ambulatory Visit: Payer: Self-pay | Admitting: Internal Medicine

## 2014-07-24 ENCOUNTER — Ambulatory Visit (INDEPENDENT_AMBULATORY_CARE_PROVIDER_SITE_OTHER): Payer: Medicare HMO | Admitting: *Deleted

## 2014-07-24 ENCOUNTER — Encounter: Payer: Self-pay | Admitting: Internal Medicine

## 2014-07-24 DIAGNOSIS — I5022 Chronic systolic (congestive) heart failure: Secondary | ICD-10-CM

## 2014-07-24 DIAGNOSIS — I428 Other cardiomyopathies: Secondary | ICD-10-CM

## 2014-07-24 DIAGNOSIS — I429 Cardiomyopathy, unspecified: Secondary | ICD-10-CM

## 2014-07-24 LAB — MDC_IDC_ENUM_SESS_TYPE_REMOTE
Battery Remaining Longevity: 50 mo
Battery Voltage: 2.92 V
Brady Statistic AP VS Percent: 1 %
Brady Statistic AS VP Percent: 1 %
Brady Statistic AS VS Percent: 99 %
Brady Statistic RA Percent Paced: 1.1 %
Brady Statistic RV Percent Paced: 1 %
HighPow Impedance: 35 Ohm
Lead Channel Impedance Value: 390 Ohm
Lead Channel Impedance Value: 510 Ohm
Lead Channel Pacing Threshold Amplitude: 0.625 V
Lead Channel Pacing Threshold Pulse Width: 0.5 ms
Lead Channel Pacing Threshold Pulse Width: 0.5 ms
Lead Channel Sensing Intrinsic Amplitude: 1.9 mV
Lead Channel Setting Pacing Amplitude: 2 V
Lead Channel Setting Pacing Pulse Width: 0.5 ms
Lead Channel Setting Sensing Sensitivity: 0.5 mV
MDC IDC MSMT BATTERY REMAINING PERCENTAGE: 47 %
MDC IDC MSMT LEADCHNL RA PACING THRESHOLD AMPLITUDE: 0.75 V
MDC IDC MSMT LEADCHNL RV SENSING INTR AMPL: 11.7 mV
MDC IDC PG SERIAL: 752544
MDC IDC SESS DTM: 20160316060011
MDC IDC SET LEADCHNL RA PACING AMPLITUDE: 2 V
MDC IDC SET ZONE DETECTION INTERVAL: 300 ms
MDC IDC STAT BRADY AP VP PERCENT: 1 %

## 2014-07-24 NOTE — Progress Notes (Signed)
Remote ICD transmission.   

## 2014-08-10 DIAGNOSIS — R1310 Dysphagia, unspecified: Secondary | ICD-10-CM | POA: Diagnosis not present

## 2014-08-10 DIAGNOSIS — I4891 Unspecified atrial fibrillation: Secondary | ICD-10-CM | POA: Diagnosis not present

## 2014-08-10 DIAGNOSIS — R413 Other amnesia: Secondary | ICD-10-CM | POA: Diagnosis not present

## 2014-08-12 ENCOUNTER — Other Ambulatory Visit: Payer: Self-pay | Admitting: Internal Medicine

## 2014-08-16 ENCOUNTER — Other Ambulatory Visit: Payer: Self-pay | Admitting: Internal Medicine

## 2014-08-19 ENCOUNTER — Other Ambulatory Visit: Payer: Self-pay

## 2014-08-19 DIAGNOSIS — M25569 Pain in unspecified knee: Secondary | ICD-10-CM

## 2014-08-19 MED ORDER — FENTANYL 25 MCG/HR TD PT72: MEDICATED_PATCH | TRANSDERMAL | Status: DC

## 2014-09-13 ENCOUNTER — Other Ambulatory Visit: Payer: Self-pay | Admitting: Internal Medicine

## 2014-09-25 ENCOUNTER — Other Ambulatory Visit: Payer: Self-pay | Admitting: *Deleted

## 2014-09-25 DIAGNOSIS — M25569 Pain in unspecified knee: Secondary | ICD-10-CM

## 2014-09-26 ENCOUNTER — Ambulatory Visit: Payer: Medicare HMO | Admitting: Internal Medicine

## 2014-09-26 ENCOUNTER — Other Ambulatory Visit: Payer: Self-pay | Admitting: *Deleted

## 2014-09-26 DIAGNOSIS — M25569 Pain in unspecified knee: Secondary | ICD-10-CM

## 2014-09-26 MED ORDER — FENTANYL 25 MCG/HR TD PT72: MEDICATED_PATCH | TRANSDERMAL | Status: DC

## 2014-09-26 NOTE — Telephone Encounter (Signed)
Patient requested and will pick up 

## 2014-09-27 ENCOUNTER — Other Ambulatory Visit: Payer: Self-pay | Admitting: *Deleted

## 2014-09-27 MED ORDER — CALCIUM CARBONATE-VITAMIN D 600-400 MG-UNIT PO TABS: ORAL_TABLET | ORAL | Status: DC

## 2014-09-27 NOTE — Telephone Encounter (Signed)
Order from [facility]

## 2014-10-18 ENCOUNTER — Telehealth: Payer: Self-pay

## 2014-10-18 ENCOUNTER — Other Ambulatory Visit: Payer: Self-pay | Admitting: Internal Medicine

## 2014-10-18 MED ORDER — MEMANTINE HCL ER 7 & 14 & 21 &28 MG PO CP24: ORAL_CAPSULE | ORAL | Status: DC

## 2014-10-18 NOTE — Telephone Encounter (Signed)
Daughter called, [facility] had called her to let her know that Dr. Mariea Clonts had written an order for medication that they can't get from their pharmacy that we would have to call [facility] to [facility]. Asked her to have [facility] to fax the order so I would know what Dr. Mariea Clonts wrote. Received fax Namenda XR titration pack. Spoke with daughter again will fax it to [facility].

## 2014-10-23 ENCOUNTER — Ambulatory Visit (INDEPENDENT_AMBULATORY_CARE_PROVIDER_SITE_OTHER): Payer: Medicare HMO | Admitting: *Deleted

## 2014-10-23 DIAGNOSIS — I429 Cardiomyopathy, unspecified: Secondary | ICD-10-CM | POA: Diagnosis not present

## 2014-10-23 DIAGNOSIS — I5022 Chronic systolic (congestive) heart failure: Secondary | ICD-10-CM | POA: Diagnosis not present

## 2014-10-23 DIAGNOSIS — I428 Other cardiomyopathies: Secondary | ICD-10-CM

## 2014-10-23 NOTE — Progress Notes (Signed)
Remote ICD transmission.   

## 2014-10-24 ENCOUNTER — Ambulatory Visit (INDEPENDENT_AMBULATORY_CARE_PROVIDER_SITE_OTHER): Payer: Medicare HMO | Admitting: Internal Medicine

## 2014-10-24 ENCOUNTER — Encounter: Payer: Self-pay | Admitting: Internal Medicine

## 2014-10-24 VITALS — BP 102/58 | HR 85 | Temp 98.4°F | Resp 20 | Ht 66.0 in | Wt 126.6 lb

## 2014-10-24 DIAGNOSIS — F0151 Vascular dementia with behavioral disturbance: Secondary | ICD-10-CM

## 2014-10-24 DIAGNOSIS — N183 Chronic kidney disease, stage 3 unspecified: Secondary | ICD-10-CM

## 2014-10-24 DIAGNOSIS — F22 Delusional disorders: Secondary | ICD-10-CM | POA: Diagnosis not present

## 2014-10-24 DIAGNOSIS — E038 Other specified hypothyroidism: Secondary | ICD-10-CM | POA: Diagnosis not present

## 2014-10-24 DIAGNOSIS — I5022 Chronic systolic (congestive) heart failure: Secondary | ICD-10-CM | POA: Diagnosis not present

## 2014-10-24 DIAGNOSIS — F0150 Vascular dementia without behavioral disturbance: Secondary | ICD-10-CM

## 2014-10-24 DIAGNOSIS — K440 Diaphragmatic hernia with obstruction, without gangrene: Secondary | ICD-10-CM

## 2014-10-24 DIAGNOSIS — E034 Atrophy of thyroid (acquired): Secondary | ICD-10-CM

## 2014-10-24 DIAGNOSIS — K224 Dyskinesia of esophagus: Secondary | ICD-10-CM

## 2014-10-24 DIAGNOSIS — F0152 Vascular dementia, unspecified severity, with psychotic disturbance: Secondary | ICD-10-CM

## 2014-10-24 NOTE — Progress Notes (Signed)
Patient ID: [name] [name], female   DOB: Oct 26, 1924, 81 y.o.   MRN: [id number]   Location:  [facility]  Code Status: DNR Goals of Care: Advanced Directive information Does patient have an advance directive?: Yes, Type of Advance Directive: Out of facility DNR (pink MOST or yellow form), Pre-existing out of facility DNR order (yellow form or pink MOST form): Yellow form placed in chart (order not valid for inpatient use), Does patient want to make changes to advanced directive?: No - Patient declined   Chief Complaint  Patient presents with  . Medical Management of Chronic Issues    3 month follow-up    HPI: Patient is a 81 y.o. white female seen in the office today for med mgt of chronic diseases.    Having some behaviors at night.  Just started on namenda XR last weekend.  Had been having anxious episodes of chest pressure.  She denies any pain.  No phone calls late at night since new medicine added per her daughter.  Had prn risperdal 3 times so far this month.    Her daughter gets her medicines, not [facility].  Gained 2.6 lbs.  Her daughter brings her some special treats once in a while.    Gets more worried at night--more paranoia there then elsewhere.  Checked that her button worked well.    Review of Systems:  Review of Systems  Constitutional: Negative for fever, chills and weight loss.  HENT: Positive for hearing loss. Negative for congestion.   Eyes: Negative for blurred vision.       Glasses  Respiratory: Negative for shortness of breath.   Cardiovascular: Negative for chest pain and leg swelling.  Gastrointestinal: Negative for abdominal pain.  Genitourinary: Negative for dysuria.  Musculoskeletal: Negative for falls.  Skin: Negative for rash.  Neurological: Positive for weakness. Negative for dizziness.  Psychiatric/Behavioral: Positive for depression, hallucinations and memory loss.       See hpi    Past  Medical History  Diagnosis Date  . Atrial fibrillation   . BRONCHITIS, CHRONIC   . CHEST PAIN   . HYPOKALEMIA   . OBSTRUCTIVE SLEEP APNEA   . Palpitations   . SYSTOLIC HEART FAILURE, CHRONIC   . Depression   . CPAP (continuous positive airway pressure) dependence   . ACE-inhibitor cough   . Cardiac defibrillator in situ   . Chronic renal insufficiency   . GERD (gastroesophageal reflux disease)   . Hypothyroidism   . Hematuria   . Osteoarthritis (arthritis due to wear and tear of joints)   . Colon polyps   . Chronic rhinitis   . RLS (restless legs syndrome)   . Chronic back pain   . Arthritis   . Glaucoma   . Sleep apnea   . CHF (congestive heart failure)   . Dementia   . H/O colonoscopy 2005  . Anxiety   . Kidney disease   . Thyroid disease   . Generalized weakness   . Falls     Past Surgical History  Procedure Laterality Date  . Back surgery  1973  . Cardiac defibrillator placement  2005     [place] [phone]  . Skin cancer excision    . Cardiac catheterization  07/29/2003     Congestive heart failure, questionable etiology.  Increased LVEDP of 70mmHg on cath --  Nonobstructive coronary disease --         . Insert / replace /  remove pacemaker  01/10/2004    Aborted biventricular implantable cardiac defibrillator implantation, complicated by probable pneumothorax  --  Deboraha Sprang, M.D.  . Cardiac defibrillator placement  01/15/2004    St. Jude 1581 65 cm dual coil active fixation defibrillator lead, serial number [id number] --  St. Jude Atlas +HF model V340CRT defibrillator, serial [id number]. -- Biventricular implantable cardioverter-defibrillator implantation, contrast venography and intraoperative defibrillation threshold testing -- SURGEON:  Deboraha Sprang, M.D.  . Implantable cardioverter defibrillator implant  03/17/2009    generator change  --  Unify St. Jude ICD model O6296183, serial number [id number]  -- Deboraha Sprang, MD, [facility]   . Femur fracture surgery   2010  . Esophagogastroduodenoscopy  04/13/2011    Procedure: ESOPHAGOGASTRODUODENOSCOPY (EGD);  Surgeon: Zenovia Jarred, MD;  Location: [facility];  Service: Gastroenterology;  Laterality: N/A;  may need dilation    Allergies  Allergen Reactions  . Ace Inhibitors Cough  . Requip [Ropinirole Hcl] Other (See Comments)    confusion   Medications: Patient's Medications  New Prescriptions   No medications on file  Previous Medications   ALLOPURINOL (ZYLOPRIM) 100 MG TABLET    Take 1 tablet by mouth daily for Gout   AMBULATORY NON FORMULARY MEDICATION    Wheelchair and Cushion. Patient is unable to propel herself-she is nonambulatory   ASCORBIC ACID (VITAMIN C) 500 MG TABLET    Take 500 mg by mouth daily.     ASPIRIN EC 81 MG TABLET    Take 1 tablet (81 mg total) by mouth daily.   CALCIUM CARB-CHOLECALCIFEROL (OYSTER SHELL CALCIUM + D PO)    Take by mouth. 1 by mouth four times daily with meals   CALCIUM CARBONATE-VITAMIN D (CALTRATE 600+D) 600-400 MG-UNIT PER TABLET    Take one tablet by mouth four times daily with meals   CARVEDILOL (COREG) 12.5 MG TABLET    Take 1/2 tablet by mouth twice daily   CRANBERRY-VITAMIN C-INULIN (UTI-STAT) LIQD    30 mLs. Taking 30 ml by mouth every morning   D3-50 50000 UNITS CAPSULE    TAKE ONE CAPSULE BY MOUTH ONCE WEEKLY ON WEDNESDAYS   FENTANYL (DURAGESIC - DOSED MCG/HR) 25 MCG/HR PATCH    Place 1 patch onto the skin every 3 days for pain   FUROSEMIDE (LASIX) 40 MG TABLET    Take 1 tablet by mouth 2 times daily for Edema   HYDRALAZINE (APRESOLINE) 10 MG TABLET    Take 1 tablet by mouth 3 times daily, breakfast, 2:00 pm and bedtime for BP   HYDROCODONE-ACETAMINOPHEN (NORCO/VICODIN) 5-325 MG PER TABLET    Take one tablet by mouth every 6 hours as needed for leg pain   ISOSORBIDE MONONITRATE (IMDUR) 30 MG 24 HR TABLET    Take 1 tablet by mouth daily for BP   KLOR-CON M20 20 MEQ TABLET    TAKE 2 TABLETS BY MOUTH TWICE A DAY FOR HYPOKALEMIA   LEVOTHYROXINE  (SYNTHROID, LEVOTHROID) 88 MCG TABLET    TAKE 1 TABLET BY MOUTH EVERY DAY FOR HYPOTHYROIDISM   MEMANTINE HCL ER (NAMENDA XR TITRATION PACK) 7 & 14 & 21 &28 MG CP24    7 mg one daily for 7 days, then  14 mg one daily for 14 days, then 21 mg one daily for 21 days, the 28 mg one daily there after for dementra   PANTOPRAZOLE (PROTONIX) 40 MG TABLET    Take 1 tablet by mouth 2 times daily before a meal for GERD  POLYETHYLENE GLYCOL (MIRALAX / GLYCOLAX) PACKET    Take 17 g by mouth daily as needed for mild constipation.    POLYVINYL ALCOHOL (LIQUIFILM TEARS) 1.4 % OPHTHALMIC SOLUTION    Place 1 drop into both eyes 3 (three) times daily.   QUETIAPINE (SEROQUEL) 50 MG TABLET    Take 50 mg by mouth at bedtime.   RISPERIDONE (RISPERDAL) 0.5 MG TABLET    TAKE ONE TABLET BY MOUTH EVERY 6 HOURS AS NEEDED FOR AGITATION.   SENNA-DOCUSATE (SENOKOT-S) 8.6-50 MG PER TABLET    Take 2 tablets by mouth daily. Hold for loose stools   SERTRALINE (ZOLOFT) 100 MG TABLET    Take 1 tablet by mouth daily for Depression  Modified Medications   No medications on file  Discontinued Medications   QUETIAPINE (SEROQUEL) 25 MG TABLET    TAKE 1 TABLET BY MOUTH AT BEDTIME   QUETIAPINE (SEROQUEL) 25 MG TABLET    TAKE 1 TABLET BY MOUTH AT BEDTIME    Physical Exam: Filed Vitals:   10/24/14 1400  BP: 102/58  Pulse: 85  Temp: 98.4 F (36.9 C)  TempSrc: Oral  Resp: 20  Height: 5\' 6"  (1.676 m)  Weight: 126 lb 9.6 oz (57.425 kg)  SpO2: 96%   Physical Exam  Cardiovascular: Normal rate, regular rhythm, normal heart sounds and intact distal pulses.   Pulmonary/Chest: Effort normal and breath sounds normal.  Abdominal: Soft. Bowel sounds are normal.  Musculoskeletal: Normal range of motion.  Seated in wheelchair  Neurological: She is alert.  Skin: Skin is warm and dry.  Psychiatric:  Pleasant and answering appropriately to questions, but does not always provide reliable information; seems happier today    Labs  reviewed: Basic Metabolic Panel:  Recent Labs  12/06/13 1656 12/27/13 1545 03/21/14 1008 07/13/14 1214  NA 140 138 140 142  K 4.3 4.2 4.2 4.1  CL 97 98 100 106  CO2 24 24 25 27   GLUCOSE 105* 97 100* 99  BUN 31* 29* 23 30*  CREATININE 1.78* 1.35* 1.41* 1.62*  CALCIUM 9.4 9.4 9.9 9.4  TSH 0.463  --   --   --    Liver Function Tests:  Recent Labs  12/06/13 1656  AST 22  ALT 10  ALKPHOS 114  BILITOT 0.7  PROT 6.7   CBC:  Recent Labs  12/27/13 1545 03/21/14 1008 07/13/14 1214  WBC 5.1 7.6 5.0  NEUTROABS 3.1 5.6 3.3  HGB 9.1* 10.5* 9.0*  HCT 28.3* 32.4* 29.2*  MCV 94 92 100.0  PLT 115* 125* 85*    Assessment/Plan 1. Vascular dementia with paranoia -seems namenda XR has helped with her night behavioral issues so far, is on titration pack so will see how she does over time  2. Esophageal dysmotility - unchanged recently, has not had choking episodes or difficulty swallowing noted  - Comprehensive metabolic panel  3. Hiatal hernia with obstruction but no gangrene - noted on prior swallow study and imaging -has not had further complications and weight has stabilized--it was previously decided not to work this up further due to her frailty and moderate to severe dementia - Comprehensive metabolic panel  4. Chronic kidney disease (CKD), stage III (moderate) - has been stable, f/u labs - Comprehensive metabolic panel - CBC with Differential/Platelet  5. Chronic systolic congestive heart failure -stable w/o acute exacerbation, is on coreg, cont lasix--she is eating fairly well lately with supplements and also treats from her daughter so cont the lasix and monitor lytes -  Comprehensive metabolic panel  6. Hypothyroidism due to acquired atrophy of thyroid Cont current synthroid and f/u tsh which has been hard to regulate recently - TSH  Labs/tests ordered:   Orders Placed This Encounter  Procedures  . TSH  . Comprehensive metabolic panel  . CBC with  Differential/Platelet    Next appt:  3 mos  Devyne Hauger L. Ragina Fenter, D.O. [facility] [address] [address] Cell Phone (Mon-Fri 8am-5pm):  [phone] On Call:  [phone] & follow prompts after 5pm & weekends Office Phone:  [phone] Office Fax:  [phone]

## 2014-10-25 LAB — CBC WITH DIFFERENTIAL/PLATELET
Basophils Absolute: 0 10*3/uL (ref 0.0–0.2)
Basos: 0 %
EOS (ABSOLUTE): 0.2 10*3/uL (ref 0.0–0.4)
Eos: 4 %
Hematocrit: 27.1 % — ABNORMAL LOW (ref 34.0–46.6)
Hemoglobin: 8.8 g/dL — ABNORMAL LOW (ref 11.1–15.9)
Immature Grans (Abs): 0 10*3/uL (ref 0.0–0.1)
Immature Granulocytes: 0 %
Lymphocytes Absolute: 1.1 10*3/uL (ref 0.7–3.1)
Lymphs: 21 %
MCH: 30.4 pg (ref 26.6–33.0)
MCHC: 32.5 g/dL (ref 31.5–35.7)
MCV: 94 fL (ref 79–97)
Monocytes Absolute: 0.3 10*3/uL (ref 0.1–0.9)
Monocytes: 6 %
Neutrophils Absolute: 3.5 10*3/uL (ref 1.4–7.0)
Neutrophils: 69 %
Platelets: 89 10*3/uL — CL (ref 150–379)
RBC: 2.89 x10E6/uL — ABNORMAL LOW (ref 3.77–5.28)
RDW: 17 % — ABNORMAL HIGH (ref 12.3–15.4)
WBC: 5.1 10*3/uL (ref 3.4–10.8)

## 2014-10-25 LAB — COMPREHENSIVE METABOLIC PANEL
ALT: 10 IU/L (ref 0–32)
AST: 19 IU/L (ref 0–40)
Albumin/Globulin Ratio: 1.5 (ref 1.1–2.5)
Albumin: 4 g/dL (ref 3.5–4.7)
Alkaline Phosphatase: 81 IU/L (ref 39–117)
BUN/Creatinine Ratio: 18 (ref 11–26)
BUN: 25 mg/dL (ref 8–27)
Bilirubin Total: 0.4 mg/dL (ref 0.0–1.2)
CO2: 21 mmol/L (ref 18–29)
Calcium: 9.1 mg/dL (ref 8.7–10.3)
Chloride: 102 mmol/L (ref 97–108)
Creatinine, Ser: 1.42 mg/dL — ABNORMAL HIGH (ref 0.57–1.00)
GFR calc Af Amer: 38 mL/min/{1.73_m2} — ABNORMAL LOW (ref >59)
GFR calc non Af Amer: 33 mL/min/{1.73_m2} — ABNORMAL LOW (ref >59)
Globulin, Total: 2.6 g/dL (ref 1.5–4.5)
Glucose: 97 mg/dL (ref 65–99)
Potassium: 4.5 mmol/L (ref 3.5–5.2)
Sodium: 141 mmol/L (ref 134–144)
Total Protein: 6.6 g/dL (ref 6.0–8.5)

## 2014-10-25 LAB — TSH: TSH: 5.27 u[IU]/mL — ABNORMAL HIGH (ref 0.450–4.500)

## 2014-10-26 ENCOUNTER — Other Ambulatory Visit: Payer: Self-pay | Admitting: Internal Medicine

## 2014-10-26 DIAGNOSIS — E039 Hypothyroidism, unspecified: Secondary | ICD-10-CM

## 2014-10-26 MED ORDER — LEVOTHYROXINE SODIUM 100 MCG PO TABS: 100.0000 ug | ORAL_TABLET | Freq: Every day | ORAL | Status: DC

## 2014-10-26 NOTE — Progress Notes (Signed)
Synthroid was sent to [facility] mail order pharmacy.

## 2014-10-27 LAB — CUP PACEART REMOTE DEVICE CHECK
Battery Remaining Longevity: 48 mo
Battery Remaining Percentage: 46 %
Battery Voltage: 2.92 V
Brady Statistic AP VP Percent: 1 %
Brady Statistic AP VS Percent: 1 %
Brady Statistic AS VS Percent: 99 %
Brady Statistic RV Percent Paced: 1 %
Date Time Interrogation Session: 20160615060010
HIGH POWER IMPEDANCE MEASURED VALUE: 36 Ohm
Lead Channel Pacing Threshold Amplitude: 0.75 V
Lead Channel Pacing Threshold Pulse Width: 0.5 ms
Lead Channel Sensing Intrinsic Amplitude: 2.1 mV
Lead Channel Setting Pacing Amplitude: 2 V
Lead Channel Setting Pacing Pulse Width: 0.5 ms
Lead Channel Setting Sensing Sensitivity: 0.5 mV
MDC IDC MSMT LEADCHNL RA IMPEDANCE VALUE: 510 Ohm
MDC IDC MSMT LEADCHNL RA PACING THRESHOLD PULSEWIDTH: 0.5 ms
MDC IDC MSMT LEADCHNL RV IMPEDANCE VALUE: 440 Ohm
MDC IDC MSMT LEADCHNL RV PACING THRESHOLD AMPLITUDE: 1 V
MDC IDC MSMT LEADCHNL RV SENSING INTR AMPL: 11.7 mV
MDC IDC SET LEADCHNL RA PACING AMPLITUDE: 2 V
MDC IDC STAT BRADY AS VP PERCENT: 1 %
MDC IDC STAT BRADY RA PERCENT PACED: 1.1 %
Pulse Gen Serial Number: 752544
Zone Setting Detection Interval: 300 ms

## 2014-10-28 ENCOUNTER — Telehealth: Payer: Self-pay

## 2014-10-28 DIAGNOSIS — E039 Hypothyroidism, unspecified: Secondary | ICD-10-CM

## 2014-10-28 MED ORDER — LEVOTHYROXINE SODIUM 100 MCG PO TABS: 100.0000 ug | ORAL_TABLET | Freq: Every day | ORAL | Status: DC

## 2014-10-28 NOTE — Telephone Encounter (Signed)
Spoke with daughter about lab results [facility], repeat TSH 12/09/14. Called [facility] in [place] [phone] (spoke with Tamitha) to cancel Levothyroxine order there. [facility] Fax order to [facility].

## 2014-11-15 ENCOUNTER — Encounter: Payer: Self-pay | Admitting: Cardiology

## 2014-11-18 ENCOUNTER — Other Ambulatory Visit: Payer: Self-pay | Admitting: *Deleted

## 2014-11-18 MED ORDER — MEMANTINE HCL ER 28 MG PO CP24: ORAL_CAPSULE | ORAL | Status: DC

## 2014-11-18 MED ORDER — CHOLECALCIFEROL 1.25 MG (50000 UT) PO CAPS: ORAL_CAPSULE | ORAL | Status: DC

## 2014-11-18 NOTE — Telephone Encounter (Signed)
Patient daughter, Nevin Bloodgood requested refills. Faxed to pharmacy.

## 2014-11-19 ENCOUNTER — Other Ambulatory Visit: Payer: Self-pay | Admitting: *Deleted

## 2014-11-19 DIAGNOSIS — M25569 Pain in unspecified knee: Secondary | ICD-10-CM

## 2014-11-19 MED ORDER — UTI-STAT PO LIQD: ORAL | Status: DC

## 2014-11-19 MED ORDER — CALCIUM CARBONATE-VITAMIN D 600-400 MG-UNIT PO TABS: ORAL_TABLET | ORAL | Status: AC

## 2014-11-19 MED ORDER — FENTANYL 25 MCG/HR TD PT72: MEDICATED_PATCH | TRANSDERMAL | Status: DC

## 2014-11-19 NOTE — Telephone Encounter (Signed)
Daughter, Nevin Bloodgood Requested and will pick up

## 2014-11-19 NOTE — Telephone Encounter (Signed)
[place] Fax Order

## 2014-11-20 ENCOUNTER — Encounter: Payer: Self-pay | Admitting: Internal Medicine

## 2014-11-27 ENCOUNTER — Other Ambulatory Visit: Payer: Self-pay | Admitting: *Deleted

## 2014-11-27 DIAGNOSIS — M25569 Pain in unspecified knee: Secondary | ICD-10-CM

## 2014-11-27 MED ORDER — MEMANTINE HCL ER 28 MG PO CP24: ORAL_CAPSULE | ORAL | Status: DC

## 2014-11-27 MED ORDER — FENTANYL 25 MCG/HR TD PT72: MEDICATED_PATCH | TRANSDERMAL | Status: DC

## 2014-12-09 ENCOUNTER — Other Ambulatory Visit: Payer: Self-pay

## 2014-12-24 ENCOUNTER — Other Ambulatory Visit: Payer: Self-pay | Admitting: *Deleted

## 2014-12-24 DIAGNOSIS — M25569 Pain in unspecified knee: Secondary | ICD-10-CM

## 2014-12-24 MED ORDER — FENTANYL 25 MCG/HR TD PT72: MEDICATED_PATCH | TRANSDERMAL | Status: DC

## 2014-12-24 NOTE — Telephone Encounter (Signed)
Patient daughter, Nevin Bloodgood requested and will pick up

## 2015-01-10 ENCOUNTER — Emergency Department (HOSPITAL_COMMUNITY)
Admission: EM | Admit: 2015-01-10 | Discharge: 2015-01-10 | Disposition: A | Discharge status: Home / Self Care | Payer: Medicare HMO | Attending: Emergency Medicine | Admitting: Emergency Medicine

## 2015-01-10 ENCOUNTER — Emergency Department (HOSPITAL_COMMUNITY): Payer: Medicare HMO

## 2015-01-10 ENCOUNTER — Encounter (HOSPITAL_COMMUNITY): Payer: Self-pay | Admitting: Emergency Medicine

## 2015-01-10 DIAGNOSIS — I5022 Chronic systolic (congestive) heart failure: Secondary | ICD-10-CM | POA: Insufficient documentation

## 2015-01-10 DIAGNOSIS — G8929 Other chronic pain: Secondary | ICD-10-CM | POA: Diagnosis not present

## 2015-01-10 DIAGNOSIS — Z9981 Dependence on supplemental oxygen: Secondary | ICD-10-CM | POA: Diagnosis not present

## 2015-01-10 DIAGNOSIS — I4891 Unspecified atrial fibrillation: Secondary | ICD-10-CM | POA: Insufficient documentation

## 2015-01-10 DIAGNOSIS — Z87891 Personal history of nicotine dependence: Secondary | ICD-10-CM | POA: Diagnosis not present

## 2015-01-10 DIAGNOSIS — Z95810 Presence of automatic (implantable) cardiac defibrillator: Secondary | ICD-10-CM | POA: Insufficient documentation

## 2015-01-10 DIAGNOSIS — E039 Hypothyroidism, unspecified: Secondary | ICD-10-CM | POA: Insufficient documentation

## 2015-01-10 DIAGNOSIS — Z7982 Long term (current) use of aspirin: Secondary | ICD-10-CM | POA: Diagnosis not present

## 2015-01-10 DIAGNOSIS — Z8709 Personal history of other diseases of the respiratory system: Secondary | ICD-10-CM | POA: Insufficient documentation

## 2015-01-10 DIAGNOSIS — F0390 Unspecified dementia without behavioral disturbance: Secondary | ICD-10-CM | POA: Insufficient documentation

## 2015-01-10 DIAGNOSIS — M1990 Unspecified osteoarthritis, unspecified site: Secondary | ICD-10-CM | POA: Diagnosis not present

## 2015-01-10 DIAGNOSIS — R079 Chest pain, unspecified: Secondary | ICD-10-CM | POA: Diagnosis present

## 2015-01-10 DIAGNOSIS — F419 Anxiety disorder, unspecified: Secondary | ICD-10-CM | POA: Insufficient documentation

## 2015-01-10 DIAGNOSIS — Z9889 Other specified postprocedural states: Secondary | ICD-10-CM | POA: Diagnosis not present

## 2015-01-10 DIAGNOSIS — Z79891 Long term (current) use of opiate analgesic: Secondary | ICD-10-CM | POA: Insufficient documentation

## 2015-01-10 DIAGNOSIS — G4733 Obstructive sleep apnea (adult) (pediatric): Secondary | ICD-10-CM | POA: Diagnosis not present

## 2015-01-10 DIAGNOSIS — R0789 Other chest pain: Secondary | ICD-10-CM | POA: Diagnosis not present

## 2015-01-10 DIAGNOSIS — K219 Gastro-esophageal reflux disease without esophagitis: Secondary | ICD-10-CM | POA: Diagnosis not present

## 2015-01-10 DIAGNOSIS — Z79899 Other long term (current) drug therapy: Secondary | ICD-10-CM | POA: Insufficient documentation

## 2015-01-10 DIAGNOSIS — E876 Hypokalemia: Secondary | ICD-10-CM | POA: Insufficient documentation

## 2015-01-10 DIAGNOSIS — F329 Major depressive disorder, single episode, unspecified: Secondary | ICD-10-CM | POA: Diagnosis not present

## 2015-01-10 DIAGNOSIS — Z87828 Personal history of other (healed) physical injury and trauma: Secondary | ICD-10-CM | POA: Insufficient documentation

## 2015-01-10 DIAGNOSIS — Z86010 Personal history of colonic polyps: Secondary | ICD-10-CM | POA: Diagnosis not present

## 2015-01-10 DIAGNOSIS — Z87448 Personal history of other diseases of urinary system: Secondary | ICD-10-CM | POA: Diagnosis not present

## 2015-01-10 DIAGNOSIS — R0989 Other specified symptoms and signs involving the circulatory and respiratory systems: Secondary | ICD-10-CM

## 2015-01-10 LAB — CBC
HCT: 34.8 % — ABNORMAL LOW (ref 36.0–46.0)
HEMOGLOBIN: 11.1 g/dL — AB (ref 12.0–15.0)
MCH: 31.2 pg (ref 26.0–34.0)
MCHC: 31.9 g/dL (ref 30.0–36.0)
MCV: 97.8 fL (ref 78.0–100.0)
PLATELETS: 83 10*3/uL — AB (ref 150–400)
RBC: 3.56 MIL/uL — AB (ref 3.87–5.11)
RDW: 16.3 % — ABNORMAL HIGH (ref 11.5–15.5)
WBC: 9.9 10*3/uL (ref 4.0–10.5)

## 2015-01-10 LAB — BASIC METABOLIC PANEL
ANION GAP: 9 (ref 5–15)
BUN: 22 mg/dL — ABNORMAL HIGH (ref 6–20)
CALCIUM: 10 mg/dL (ref 8.9–10.3)
CHLORIDE: 105 mmol/L (ref 101–111)
CO2: 25 mmol/L (ref 22–32)
CREATININE: 1.51 mg/dL — AB (ref 0.44–1.00)
GFR calc non Af Amer: 30 mL/min — ABNORMAL LOW (ref >60)
GFR, EST AFRICAN AMERICAN: 34 mL/min — AB (ref >60)
Glucose, Bld: 114 mg/dL — ABNORMAL HIGH (ref 65–99)
Potassium: 4.1 mmol/L (ref 3.5–5.1)
SODIUM: 139 mmol/L (ref 135–145)

## 2015-01-10 LAB — I-STAT TROPONIN, ED: TROPONIN I, POC: 0 ng/mL (ref 0.00–0.08)

## 2015-01-10 MED ORDER — ACETAMINOPHEN 325 MG PO TABS
650.0000 mg | ORAL_TABLET | Freq: Once | ORAL | Status: AC
  Administered 2015-01-10: 650 mg via ORAL
  Filled 2015-01-10: qty 2

## 2015-01-10 NOTE — ED Provider Notes (Addendum)
CSN: [id number]     Arrival date & time 01/10/15  1108 History   First MD Initiated Contact with Patient 01/10/15 1130     Chief Complaint  Patient presents with  . Chest Pain   Level V caveat secondary to patient with history of dementia.  (Consider location/radiation/quality/duration/timing/severity/associated sxs/prior Treatment) HPI  Level V caveat secondary patient with history of dementia. 81 year old female who had some chest pressure followed by coughing. EMS was called on their arrival she was having watery secretions. They report suctioning out a large amount of bacterial. This resolved her initial dyspnea and chest pressure. She denies pain prior to this episode. She has not had fever, chills, nausea, or vomiting.  Past Medical History  Diagnosis Date  . Atrial fibrillation   . BRONCHITIS, CHRONIC   . CHEST PAIN   . HYPOKALEMIA   . OBSTRUCTIVE SLEEP APNEA   . Palpitations   . SYSTOLIC HEART FAILURE, CHRONIC   . Depression   . CPAP (continuous positive airway pressure) dependence   . ACE-inhibitor cough   . Cardiac defibrillator in situ   . Chronic renal insufficiency   . GERD (gastroesophageal reflux disease)   . Hypothyroidism   . Hematuria   . Osteoarthritis (arthritis due to wear and tear of joints)   . Colon polyps   . Chronic rhinitis   . RLS (restless legs syndrome)   . Chronic back pain   . Arthritis   . Glaucoma   . Sleep apnea   . CHF (congestive heart failure)   . Dementia   . H/O colonoscopy 2005  . Anxiety   . Kidney disease   . Thyroid disease   . Generalized weakness   . Falls    Past Surgical History  Procedure Laterality Date  . Back surgery  1973  . Cardiac defibrillator placement  2005     [place] [phone]  . Skin cancer excision    . Cardiac catheterization  07/29/2003     Congestive heart failure, questionable etiology.  Increased LVEDP of 43mmHg on cath --  Nonobstructive coronary disease --         . Insert / replace /  remove pacemaker  01/10/2004    Aborted biventricular implantable cardiac defibrillator implantation, complicated by probable pneumothorax  --  Deboraha Sprang, M.D.  . Cardiac defibrillator placement  01/15/2004    St. Jude 1581 65 cm dual coil active fixation defibrillator lead, serial number [id number] --  St. Jude Atlas +HF model V340CRT defibrillator, serial [id number]. -- Biventricular implantable cardioverter-defibrillator implantation, contrast venography and intraoperative defibrillation threshold testing -- SURGEON:  Deboraha Sprang, M.D.  . Implantable cardioverter defibrillator implant  03/17/2009    generator change  --  Unify St. Jude ICD model O6296183, serial number [id number]  -- Deboraha Sprang, MD, [facility]   . Femur fracture surgery  2010  . Esophagogastroduodenoscopy  04/13/2011    Procedure: ESOPHAGOGASTRODUODENOSCOPY (EGD);  Surgeon: Zenovia Jarred, MD;  Location: [facility];  Service: Gastroenterology;  Laterality: N/A;  may need dilation   Family History  Problem Relation Age of Onset  . Heart disease Mother   . Colon cancer Mother   . Colon polyps Mother   . Kidney disease Mother   . Emphysema Father   . Liver disease Brother   . Kidney disease Maternal Grandmother   . Breast cancer Maternal Aunt   . Heart disease Father    Social History  Substance Use Topics  . Smoking status:  Former Smoker -- 1.00 packs/day    Types: Cigarettes    Quit date: 04/10/2003  . Smokeless tobacco: Never Used  . Alcohol Use: No   OB History    No data available     Review of Systems  Unable to perform ROS     Allergies  Ace inhibitors and Requip  Home Medications   Prior to Admission medications   Medication Sig Start Date End Date Taking? Authorizing Provider  allopurinol (ZYLOPRIM) 100 MG tablet Take 1 tablet by mouth daily for Gout 02/18/14  Yes Lauree Chandler, NP  AMBULATORY NON [place] Wheelchair and Cushion. Patient is unable to propel herself-she is nonambulatory  05/28/14  Yes [name] L Reed, DO  aspirin EC 81 MG tablet Take 1 tablet (81 mg total) by mouth daily. 10/14/11  Yes Deboraha Sprang, MD  carvedilol (COREG) 12.5 MG tablet Take 1/2 tablet by mouth twice daily Patient taking differently: Take 6.25 mg by mouth 2 (two) times daily with a meal.  02/18/14  Yes Lauree Chandler, NP  Cranberry-Vitamin C-Inulin (UTI-STAT) LIQD Taking 30 ml by mouth every morning 11/19/14  Yes Lauree Chandler, NP  furosemide (LASIX) 40 MG tablet Take 1 tablet by mouth 2 times daily for Edema 02/18/14  Yes Lauree Chandler, NP  hydrALAZINE (APRESOLINE) 10 MG tablet Take 1 tablet by mouth 3 times daily, breakfast, 2:00 pm and bedtime for BP 02/18/14  Yes Lauree Chandler, NP  HYDROcodone-acetaminophen (NORCO/VICODIN) 5-325 MG per tablet Take one tablet by mouth every 6 hours as needed for leg pain 02/28/14  Yes [name] L Reed, DO  isosorbide mononitrate (IMDUR) 30 MG 24 hr tablet Take 1 tablet by mouth daily for BP 02/18/14  Yes Lauree Chandler, NP  KLOR-CON M20 20 MEQ tablet TAKE 2 TABLETS BY MOUTH TWICE A DAY FOR HYPOKALEMIA 10/18/14  Yes [name] L Reed, DO  levothyroxine (SYNTHROID, LEVOTHROID) 100 MCG tablet Take 1 tablet (100 mcg total) by mouth daily. 10/28/14  Yes [name] L Reed, DO  memantine (NAMENDA XR) 28 MG CP24 24 hr capsule Take one tablet by mouth once daily for memory 11/27/14  Yes Estill Dooms, MD  pantoprazole (PROTONIX) 40 MG tablet Take 1 tablet by mouth 2 times daily before a meal for GERD 05/16/14  Yes [name] L Reed, DO  polyethylene glycol (MIRALAX / GLYCOLAX) packet Take 17 g by mouth daily as needed for mild constipation.    Yes Historical Provider, MD  polyvinyl alcohol (LIQUIFILM TEARS) 1.4 % ophthalmic solution Place 1 drop into both eyes 3 (three) times daily. 08/20/13  Yes [name] L Reed, DO  QUEtiapine (SEROQUEL) 50 MG tablet Take 50 mg by mouth at bedtime.   Yes Historical Provider, MD  risperiDONE (RISPERDAL) 0.5 MG tablet TAKE ONE TABLET BY MOUTH  EVERY 6 HOURS AS NEEDED FOR AGITATION. 04/15/14  Yes [name] L Reed, DO  senna-docusate (SENOKOT-S) 8.6-50 MG per tablet Take 2 tablets by mouth daily. Hold for loose stools 03/21/14  Yes [name] L Reed, DO  sertraline (ZOLOFT) 100 MG tablet Take 1 tablet by mouth daily for Depression 02/18/14  Yes Lauree Chandler, NP  Calcium Carb-Cholecalciferol (OYSTER SHELL CALCIUM + D PO) Take by mouth. 1 by mouth four times daily with meals    Historical Provider, MD  Calcium Carbonate-Vitamin D (CALTRATE 600+D) 600-400 MG-UNIT per tablet Take one tablet by mouth four times daily with meals 11/19/14   Lauree Chandler, NP  Cholecalciferol (D3-50) 50000 UNITS  capsule Take one capsule by mouth once weekly on Wednesdays for vitamin d 11/18/14   Gayland Curry, DO  fentaNYL (DURAGESIC - DOSED MCG/HR) 25 MCG/HR patch Place 1 patch onto the skin every 3 days for pain 12/24/14   Estill Dooms, MD  Memantine HCl ER (NAMENDA XR TITRATION PACK) 7 & 14 & 21 &28 MG CP24 7 mg one daily for 7 days, then  14 mg one daily for 14 days, then 21 mg one daily for 21 days, the 28 mg one daily there after for dementra 10/18/14   [name] L Reed, DO   BP 122/70 mmHg  Pulse 85  Temp(Src) 98.2 F (36.8 C) (Oral)  Resp 22  SpO2 99% Physical Exam  Constitutional: She is oriented to person, place, and time. She appears well-developed and well-nourished.  HENT:  Head: Normocephalic and atraumatic.  Right Ear: External ear normal.  Left Ear: External ear normal.  Nose: Nose normal.  Mouth/Throat: Oropharynx is clear and moist.  Eyes: Conjunctivae and EOM are normal. Pupils are equal, round, and reactive to light.  Neck: Normal range of motion. Neck supple.  Cardiovascular: Normal rate, regular rhythm, normal heart sounds and intact distal pulses.   Pulmonary/Chest: Effort normal and breath sounds normal.  Abdominal: Soft. Bowel sounds are normal.  Musculoskeletal: Normal range of motion.  Neurological: She is alert and oriented  to person, place, and time. She has normal reflexes.  Skin: Skin is warm and dry.  Psychiatric: She has a normal mood and affect. Her behavior is normal. Judgment and thought content normal.  Nursing note and vitals reviewed.   ED Course  Procedures (including critical care time) Labs Review Labs Reviewed  BASIC METABOLIC PANEL - Abnormal; Notable for the following:    Glucose, Bld 114 (*)    BUN 22 (*)    Creatinine, Ser 1.51 (*)    GFR calc non Af Amer 30 (*)    GFR calc Af Amer 34 (*)    All other components within normal limits  CBC - Abnormal; Notable for the following:    RBC 3.56 (*)    Hemoglobin 11.1 (*)    HCT 34.8 (*)    RDW 16.3 (*)    Platelets 83 (*)    All other components within normal limits  I-STAT TROPOININ, ED    Imaging Review Dg Chest 2 View  01/10/2015   CLINICAL DATA:  Chest pain, expiratory wheezing  EXAM: CHEST  2 VIEW  COMPARISON:  07/29/2013  FINDINGS: Left-sided AICD in place. Lungs are clear. Mild hypoaeration with crowding of the bronchovascular markings at the lung bases is noted. Mild cardiomegaly. No pleural effusion. Bones are subjectively osteopenic without focal acute finding. Multilevel disc degenerative change.  IMPRESSION: Mild cardiomegaly without focal acute finding.   Electronically Signed   By: Conchita Paris M.D.   On: 01/10/2015 12:29   I have personally reviewed and evaluated these images and lab results as part of my medical decision-making.   EKG Interpretation   Date/Time:  Friday January 10 2015 11:28:39 EDT Ventricular Rate:  90 PR Interval:  155 QRS Duration: 135 QT Interval:  407 QTC Calculation: 498 R Axis:   96 Text Interpretation:  Normal sinus rhythm Left bundle branch block  morphology unchanged from3/17/15 but changed from 07/13/14 Confirmed by Kathren Scearce  MD, Andee Poles [phone]) on 01/10/2015 11:40:33 AM      MDM   Final diagnoses:  Chest congestion    Patient with episode of chest  pressure that resolved after  having a large amount of secretions suctioned. EKG here reveals a left bundle branch block unchanged from a previous EKG. Troponin is negative. Given history, I have low index of suspicion for this being an acute cardiac issue. She may have had a mucous plug. She does not have any evidence of infection seen on chest x-Paulette [place]. EKG unchanged, labs normal, chest x-Shauntavia Brackin with mild cardiomegaly but no acute findings. Patient has remained stable without any return of the congestion or pressure here in emergency department she is discharged back to her nursing facility with instructions to them to have her return if she is worse anytime. Otherwise, they will have her follow-up with her primary physician.  Pattricia Boss, MD 01/10/15 1415  Pattricia Boss, MD 01/10/15 [phone]

## 2015-01-10 NOTE — ED Notes (Signed)
Per [facility], pt from [facility], staff Called out for chest pressure, pt suctioned by ems multiple times, watery secretions noted. EKG shows LBBB. Little bit of expiratory wheezing in the upper front lobes

## 2015-01-10 NOTE — Discharge Instructions (Signed)
Patient has no evidence of acute cardiac or pulmonary disease here in emergency department. Her symptoms had completely resolved after coughing and suctioning by EMS. Her return if her symptoms worsen. Otherwise, have her follow-up with her primary care physician as soon as possible.

## 2015-01-21 ENCOUNTER — Other Ambulatory Visit: Payer: Self-pay | Admitting: *Deleted

## 2015-01-21 DIAGNOSIS — M25569 Pain in unspecified knee: Secondary | ICD-10-CM

## 2015-01-21 MED ORDER — FENTANYL 25 MCG/HR TD PT72: MEDICATED_PATCH | TRANSDERMAL | Status: DC

## 2015-01-21 NOTE — Telephone Encounter (Signed)
Patient daughter, Nevin Bloodgood requested and will be picking up. Nevin Bloodgood is going out of town and needs to pick up alittle early so her mother doesn't run out while she is away.

## 2015-01-30 ENCOUNTER — Ambulatory Visit: Payer: Medicare HMO | Admitting: Internal Medicine

## 2015-02-06 ENCOUNTER — Other Ambulatory Visit: Payer: Self-pay

## 2015-02-06 MED ORDER — CHOLECALCIFEROL 1.25 MG (50000 UT) PO CAPS: ORAL_CAPSULE | ORAL | Status: DC

## 2015-02-08 ENCOUNTER — Other Ambulatory Visit: Payer: Self-pay | Admitting: Internal Medicine

## 2015-02-10 ENCOUNTER — Telehealth: Payer: Self-pay | Admitting: *Deleted

## 2015-02-10 MED ORDER — POLYETHYLENE GLYCOL 3350 17 G PO PACK: 17.0000 g | PACK | Freq: Every day | ORAL | Status: AC

## 2015-02-10 MED ORDER — CHOLECALCIFEROL 1.25 MG (50000 UT) PO CAPS: ORAL_CAPSULE | ORAL | Status: DC

## 2015-02-10 NOTE — Telephone Encounter (Signed)
Patient daughter, Nevin Bloodgood called and stated that patient is having constipation issues and would like to know if you will write an order for [facility] to add Miralax to her Cranberry Juice every morning. Also requested a refill on Vitamin D. Please Advise.

## 2015-02-10 NOTE — Telephone Encounter (Signed)
Agree with miralax 17g in 8oz cranberry juice daily for constipation and renewal of her vitamin D.

## 2015-02-10 NOTE — Telephone Encounter (Signed)
Faxed Vitamin D to pharmacy and faxed order to [facility] for Miralax Fax#: [phone]. Daughter notified.

## 2015-02-11 ENCOUNTER — Encounter: Payer: Self-pay | Admitting: *Deleted

## 2015-02-11 ENCOUNTER — Telehealth: Payer: Self-pay | Admitting: *Deleted

## 2015-02-11 NOTE — Telephone Encounter (Signed)
Received fax from [facility] [phone] Fax: [phone] stating Resident gets really constipated and becomes impacted due to size of bowel movement. Can we get an order for a fleet suppository if needed. Resident cries at times due to the fact she can not pass her bowels and from the pain she is in.  Fax Order given to Sherrie Mustache to review and sign.

## 2015-02-13 ENCOUNTER — Other Ambulatory Visit: Payer: Self-pay

## 2015-02-13 MED ORDER — SERTRALINE HCL 100 MG PO TABS: ORAL_TABLET | ORAL | Status: DC

## 2015-02-13 NOTE — Telephone Encounter (Signed)
Patients daughter called requesting a refill for her mother's Zoloft, [facility] [address]

## 2015-02-21 ENCOUNTER — Inpatient Hospital Stay (HOSPITAL_COMMUNITY)
Admission: EM | Admit: 2015-02-21 | Discharge: 2015-02-24 | DRG: 871 | Disposition: A | Discharge status: Home / Self Care | Payer: Medicare HMO | Attending: Internal Medicine | Admitting: Internal Medicine

## 2015-02-21 ENCOUNTER — Emergency Department (HOSPITAL_COMMUNITY): Payer: Medicare HMO

## 2015-02-21 ENCOUNTER — Encounter (HOSPITAL_COMMUNITY): Payer: Self-pay

## 2015-02-21 ENCOUNTER — Other Ambulatory Visit: Payer: Self-pay | Admitting: *Deleted

## 2015-02-21 DIAGNOSIS — E876 Hypokalemia: Secondary | ICD-10-CM | POA: Diagnosis present

## 2015-02-21 DIAGNOSIS — Z85828 Personal history of other malignant neoplasm of skin: Secondary | ICD-10-CM

## 2015-02-21 DIAGNOSIS — N183 Chronic kidney disease, stage 3 unspecified: Secondary | ICD-10-CM | POA: Diagnosis present

## 2015-02-21 DIAGNOSIS — A419 Sepsis, unspecified organism: Principal | ICD-10-CM | POA: Diagnosis present

## 2015-02-21 DIAGNOSIS — I4891 Unspecified atrial fibrillation: Secondary | ICD-10-CM | POA: Diagnosis present

## 2015-02-21 DIAGNOSIS — F0390 Unspecified dementia without behavioral disturbance: Secondary | ICD-10-CM | POA: Diagnosis present

## 2015-02-21 DIAGNOSIS — H409 Unspecified glaucoma: Secondary | ICD-10-CM | POA: Diagnosis present

## 2015-02-21 DIAGNOSIS — N189 Chronic kidney disease, unspecified: Secondary | ICD-10-CM

## 2015-02-21 DIAGNOSIS — E860 Dehydration: Secondary | ICD-10-CM | POA: Diagnosis present

## 2015-02-21 DIAGNOSIS — K5900 Constipation, unspecified: Secondary | ICD-10-CM | POA: Diagnosis present

## 2015-02-21 DIAGNOSIS — R6510 Systemic inflammatory response syndrome (SIRS) of non-infectious origin without acute organ dysfunction: Secondary | ICD-10-CM | POA: Diagnosis present

## 2015-02-21 DIAGNOSIS — D61818 Other pancytopenia: Secondary | ICD-10-CM | POA: Diagnosis present

## 2015-02-21 DIAGNOSIS — M25569 Pain in unspecified knee: Secondary | ICD-10-CM

## 2015-02-21 DIAGNOSIS — M1990 Unspecified osteoarthritis, unspecified site: Secondary | ICD-10-CM | POA: Diagnosis present

## 2015-02-21 DIAGNOSIS — Z888 Allergy status to other drugs, medicaments and biological substances status: Secondary | ICD-10-CM

## 2015-02-21 DIAGNOSIS — Z8673 Personal history of transient ischemic attack (TIA), and cerebral infarction without residual deficits: Secondary | ICD-10-CM

## 2015-02-21 DIAGNOSIS — G2581 Restless legs syndrome: Secondary | ICD-10-CM | POA: Diagnosis present

## 2015-02-21 DIAGNOSIS — N390 Urinary tract infection, site not specified: Secondary | ICD-10-CM | POA: Diagnosis present

## 2015-02-21 DIAGNOSIS — Z87440 Personal history of urinary (tract) infections: Secondary | ICD-10-CM

## 2015-02-21 DIAGNOSIS — G4733 Obstructive sleep apnea (adult) (pediatric): Secondary | ICD-10-CM | POA: Diagnosis present

## 2015-02-21 DIAGNOSIS — Z86010 Personal history of colonic polyps: Secondary | ICD-10-CM

## 2015-02-21 DIAGNOSIS — I429 Cardiomyopathy, unspecified: Secondary | ICD-10-CM | POA: Diagnosis present

## 2015-02-21 DIAGNOSIS — K219 Gastro-esophageal reflux disease without esophagitis: Secondary | ICD-10-CM | POA: Diagnosis present

## 2015-02-21 DIAGNOSIS — N179 Acute kidney failure, unspecified: Secondary | ICD-10-CM | POA: Insufficient documentation

## 2015-02-21 DIAGNOSIS — G9340 Encephalopathy, unspecified: Secondary | ICD-10-CM | POA: Diagnosis present

## 2015-02-21 DIAGNOSIS — D696 Thrombocytopenia, unspecified: Secondary | ICD-10-CM | POA: Diagnosis present

## 2015-02-21 DIAGNOSIS — E870 Hyperosmolality and hypernatremia: Secondary | ICD-10-CM | POA: Diagnosis present

## 2015-02-21 DIAGNOSIS — E871 Hypo-osmolality and hyponatremia: Secondary | ICD-10-CM | POA: Diagnosis present

## 2015-02-21 DIAGNOSIS — Z66 Do not resuscitate: Secondary | ICD-10-CM | POA: Diagnosis present

## 2015-02-21 DIAGNOSIS — R6521 Severe sepsis with septic shock: Secondary | ICD-10-CM | POA: Diagnosis present

## 2015-02-21 DIAGNOSIS — Z95810 Presence of automatic (implantable) cardiac defibrillator: Secondary | ICD-10-CM

## 2015-02-21 DIAGNOSIS — E039 Hypothyroidism, unspecified: Secondary | ICD-10-CM | POA: Diagnosis present

## 2015-02-21 DIAGNOSIS — Z87891 Personal history of nicotine dependence: Secondary | ICD-10-CM

## 2015-02-21 DIAGNOSIS — I5022 Chronic systolic (congestive) heart failure: Secondary | ICD-10-CM | POA: Diagnosis present

## 2015-02-21 LAB — COMPREHENSIVE METABOLIC PANEL
ALBUMIN: 3.8 g/dL (ref 3.5–5.0)
ALK PHOS: 68 U/L (ref 38–126)
ALT: 12 U/L — ABNORMAL LOW (ref 14–54)
ANION GAP: 12 (ref 5–15)
AST: 25 U/L (ref 15–41)
BILIRUBIN TOTAL: 1 mg/dL (ref 0.3–1.2)
BUN: 62 mg/dL — ABNORMAL HIGH (ref 6–20)
CALCIUM: 9.7 mg/dL (ref 8.9–10.3)
CO2: 24 mmol/L (ref 22–32)
CREATININE: 2.83 mg/dL — AB (ref 0.44–1.00)
Chloride: 109 mmol/L (ref 101–111)
GFR calc Af Amer: 16 mL/min — ABNORMAL LOW (ref >60)
GFR, EST NON AFRICAN AMERICAN: 14 mL/min — AB (ref >60)
GLUCOSE: 143 mg/dL — AB (ref 65–99)
Potassium: 4.4 mmol/L (ref 3.5–5.1)
Sodium: 145 mmol/L (ref 135–145)
TOTAL PROTEIN: 7.7 g/dL (ref 6.5–8.1)

## 2015-02-21 LAB — CBC
HCT: 32.6 % — ABNORMAL LOW (ref 36.0–46.0)
HEMOGLOBIN: 10 g/dL — AB (ref 12.0–15.0)
MCH: 30.7 pg (ref 26.0–34.0)
MCHC: 30.7 g/dL (ref 30.0–36.0)
MCV: 100 fL (ref 78.0–100.0)
PLATELETS: 55 10*3/uL — AB (ref 150–400)
RBC: 3.26 MIL/uL — AB (ref 3.87–5.11)
RDW: 16.5 % — ABNORMAL HIGH (ref 11.5–15.5)
WBC: 3.1 10*3/uL — AB (ref 4.0–10.5)

## 2015-02-21 LAB — MRSA PCR SCREENING: MRSA BY PCR: NEGATIVE

## 2015-02-21 LAB — URINALYSIS, ROUTINE W REFLEX MICROSCOPIC
BILIRUBIN URINE: NEGATIVE
Glucose, UA: NEGATIVE mg/dL
KETONES UR: NEGATIVE mg/dL
NITRITE: NEGATIVE
PH: 6 (ref 5.0–8.0)
PROTEIN: NEGATIVE mg/dL
Specific Gravity, Urine: 1.013 (ref 1.005–1.030)
UROBILINOGEN UA: 0.2 mg/dL (ref 0.0–1.0)

## 2015-02-21 LAB — PROCALCITONIN: Procalcitonin: <0.1 ng/mL

## 2015-02-21 LAB — URINE MICROSCOPIC-ADD ON

## 2015-02-21 LAB — PROTIME-INR
INR: 1.21 (ref 0.00–1.49)
PROTHROMBIN TIME: 15.5 s — AB (ref 11.6–15.2)

## 2015-02-21 LAB — I-STAT CG4 LACTIC ACID, ED: Lactic Acid, Venous: 1.2 mmol/L (ref 0.5–2.0)

## 2015-02-21 LAB — I-STAT TROPONIN, ED: TROPONIN I, POC: 0.01 ng/mL (ref 0.00–0.08)

## 2015-02-21 LAB — TSH: TSH: 0.442 u[IU]/mL (ref 0.350–4.500)

## 2015-02-21 LAB — CBG MONITORING, ED: GLUCOSE-CAPILLARY: 134 mg/dL — AB (ref 65–99)

## 2015-02-21 MED ORDER — SERTRALINE HCL 100 MG PO TABS
100.0000 mg | ORAL_TABLET | Freq: Every day | ORAL | Status: DC
  Administered 2015-02-21 – 2015-02-24 (×4): 100 mg via ORAL
  Filled 2015-02-21 (×4): qty 1

## 2015-02-21 MED ORDER — ONDANSETRON HCL 4 MG PO TABS: 4.0000 mg | ORAL_TABLET | Freq: Four times a day (QID) | ORAL | Status: DC | PRN

## 2015-02-21 MED ORDER — PANTOPRAZOLE SODIUM 40 MG PO TBEC
40.0000 mg | DELAYED_RELEASE_TABLET | Freq: Two times a day (BID) | ORAL | Status: DC
Start: 2015-02-21 — End: 2015-02-24
  Administered 2015-02-22 – 2015-02-24 (×6): 40 mg via ORAL
  Filled 2015-02-21 (×6): qty 1

## 2015-02-21 MED ORDER — VANCOMYCIN HCL IN DEXTROSE 750-5 MG/150ML-% IV SOLN: 750.0000 mg | INTRAVENOUS | Status: DC

## 2015-02-21 MED ORDER — FENTANYL 25 MCG/HR TD PT72: MEDICATED_PATCH | TRANSDERMAL | Status: DC

## 2015-02-21 MED ORDER — ACETAMINOPHEN 650 MG RE SUPP: 650.0000 mg | Freq: Four times a day (QID) | RECTAL | Status: DC | PRN

## 2015-02-21 MED ORDER — FENTANYL 25 MCG/HR TD PT72
25.0000 ug | MEDICATED_PATCH | TRANSDERMAL | Status: DC
  Administered 2015-02-22: 25 ug via TRANSDERMAL
  Filled 2015-02-21: qty 1

## 2015-02-21 MED ORDER — MEMANTINE HCL ER 28 MG PO CP24
28.0000 mg | ORAL_CAPSULE | Freq: Every day | ORAL | Status: DC
  Administered 2015-02-21 – 2015-02-24 (×4): 28 mg via ORAL
  Filled 2015-02-21 (×4): qty 1

## 2015-02-21 MED ORDER — ALLOPURINOL 100 MG PO TABS
100.0000 mg | ORAL_TABLET | Freq: Every day | ORAL | Status: DC
  Administered 2015-02-22 – 2015-02-24 (×3): 100 mg via ORAL
  Filled 2015-02-21 (×5): qty 1

## 2015-02-21 MED ORDER — ACETAMINOPHEN 325 MG PO TABS: 650.0000 mg | ORAL_TABLET | Freq: Four times a day (QID) | ORAL | Status: DC | PRN

## 2015-02-21 MED ORDER — VANCOMYCIN HCL IN DEXTROSE 1-5 GM/200ML-% IV SOLN
1000.0000 mg | Freq: Once | INTRAVENOUS | Status: AC
  Administered 2015-02-21: 1000 mg via INTRAVENOUS
  Filled 2015-02-21: qty 200

## 2015-02-21 MED ORDER — CARVEDILOL 6.25 MG PO TABS
6.2500 mg | ORAL_TABLET | Freq: Two times a day (BID) | ORAL | Status: DC
  Administered 2015-02-21 – 2015-02-24 (×7): 6.25 mg via ORAL
  Filled 2015-02-21 (×7): qty 1

## 2015-02-21 MED ORDER — DOCUSATE SODIUM 100 MG PO CAPS
100.0000 mg | ORAL_CAPSULE | Freq: Two times a day (BID) | ORAL | Status: DC
  Administered 2015-02-21 – 2015-02-24 (×6): 100 mg via ORAL
  Filled 2015-02-21 (×8): qty 1

## 2015-02-21 MED ORDER — POLYVINYL ALCOHOL 1.4 % OP SOLN
1.0000 [drp] | Freq: Three times a day (TID) | OPHTHALMIC | Status: DC
  Administered 2015-02-21 – 2015-02-24 (×8): 1 [drp] via OPHTHALMIC
  Filled 2015-02-21 (×2): qty 15

## 2015-02-21 MED ORDER — DEXTROSE 5 % IV SOLN
1.0000 g | INTRAVENOUS | Status: DC
  Administered 2015-02-21 – 2015-02-24 (×4): 1 g via INTRAVENOUS
  Filled 2015-02-21 (×4): qty 10

## 2015-02-21 MED ORDER — ISOSORBIDE MONONITRATE ER 30 MG PO TB24
30.0000 mg | ORAL_TABLET | Freq: Every day | ORAL | Status: DC
  Administered 2015-02-21 – 2015-02-24 (×4): 30 mg via ORAL
  Filled 2015-02-21 (×5): qty 1

## 2015-02-21 MED ORDER — SODIUM CHLORIDE 0.9 % IV SOLN
INTRAVENOUS | Status: DC
  Administered 2015-02-21 – 2015-02-22 (×2): via INTRAVENOUS

## 2015-02-21 MED ORDER — SODIUM CHLORIDE 0.9 % IV SOLN: 250.0000 mL | INTRAVENOUS | Status: DC | PRN

## 2015-02-21 MED ORDER — HYDROCODONE-ACETAMINOPHEN 5-325 MG PO TABS: 1.0000 | ORAL_TABLET | Freq: Four times a day (QID) | ORAL | Status: DC | PRN

## 2015-02-21 MED ORDER — HYDROCODONE-ACETAMINOPHEN 5-325 MG PO TABS: ORAL_TABLET | ORAL | Status: DC

## 2015-02-21 MED ORDER — SODIUM CHLORIDE 0.9 % IV SOLN
Freq: Once | INTRAVENOUS | Status: AC
  Administered 2015-02-21: 11:00:00 via INTRAVENOUS

## 2015-02-21 MED ORDER — LEVOTHYROXINE SODIUM 100 MCG PO TABS
100.0000 ug | ORAL_TABLET | Freq: Every day | ORAL | Status: DC
  Administered 2015-02-22 – 2015-02-24 (×3): 100 ug via ORAL
  Filled 2015-02-21 (×3): qty 1

## 2015-02-21 MED ORDER — POLYETHYLENE GLYCOL 3350 17 G PO PACK: 17.0000 g | PACK | Freq: Every day | ORAL | Status: DC | PRN

## 2015-02-21 MED ORDER — SODIUM CHLORIDE 0.9 % IJ SOLN
3.0000 mL | Freq: Two times a day (BID) | INTRAMUSCULAR | Status: DC
  Administered 2015-02-24: 3 mL via INTRAVENOUS

## 2015-02-21 MED ORDER — ASPIRIN EC 81 MG PO TBEC
81.0000 mg | DELAYED_RELEASE_TABLET | Freq: Every day | ORAL | Status: DC
  Administered 2015-02-22 – 2015-02-24 (×3): 81 mg via ORAL
  Filled 2015-02-21 (×4): qty 1

## 2015-02-21 MED ORDER — ONDANSETRON HCL 4 MG/2ML IJ SOLN: 4.0000 mg | Freq: Four times a day (QID) | INTRAMUSCULAR | Status: DC | PRN

## 2015-02-21 MED ORDER — QUETIAPINE FUMARATE 25 MG PO TABS
50.0000 mg | ORAL_TABLET | Freq: Every day | ORAL | Status: DC
  Administered 2015-02-21 – 2015-02-23 (×3): 50 mg via ORAL
  Filled 2015-02-21 (×3): qty 2

## 2015-02-21 MED ORDER — SODIUM CHLORIDE 0.9 % IJ SOLN: 3.0000 mL | INTRAMUSCULAR | Status: DC | PRN

## 2015-02-21 NOTE — ED Notes (Signed)
MD Wofford at bedside speaking with pt's daughter.

## 2015-02-21 NOTE — Progress Notes (Signed)
02/21/2015 3:30 PM  Patient brought to [facility]. No bed in patient room. Patient remained on stretcher during this time. Informed AC. Informed Patient Placement. New room ready on [facility]. Patient and family member were transported to [facility] [place] via stretcher and ED EMT. This RN gave [facility] RN a bedside report about patient. All belongings were taken with the patient as well.    [employer], RN-BC, RN3 [employer] [facility] Phone [phone]  (Active Charge Nurse at this time)

## 2015-02-21 NOTE — ED Notes (Signed)
Admitting MD at bedside.

## 2015-02-21 NOTE — Progress Notes (Signed)
ANTIBIOTIC CONSULT NOTE - INITIAL  Pharmacy Consult for Vancomycin, Ceftriaxone   Indication: Sepsis   Allergies  Allergen Reactions  . Ace Inhibitors Cough  . Requip [Ropinirole Hcl] Other (See Comments)    confusion    Patient Measurements:   Actual Body Weight: 57.4 kg (on 10/24/14)  Vital Signs: Temp: 100.8 F (38.2 C) (10/14 0748) Temp Source: Rectal (10/14 0748) BP: 137/70 mmHg (10/14 0915) Pulse Rate: 101 (10/14 0915) Intake/Output from previous day:   Intake/Output from this shift: Total I/O In: -  Out: 50 [Urine:50]  Labs:  Recent Labs  02/21/15 0807  WBC 3.1*  HGB 10.0*  PLT 55*  CREATININE 2.83*   CrCl cannot be calculated (Unknown ideal weight.). No results for input(s): VANCOTROUGH, VANCOPEAK, VANCORANDOM, GENTTROUGH, GENTPEAK, GENTRANDOM, TOBRATROUGH, TOBRAPEAK, TOBRARND, AMIKACINPEAK, AMIKACINTROU, AMIKACIN in the last 72 hours.   Microbiology: No results found for this or any previous visit (from the past 720 hour(s)).  Medical History: Past Medical History  Diagnosis Date  . Atrial fibrillation ([place])   . BRONCHITIS, CHRONIC   . CHEST PAIN   . HYPOKALEMIA   . OBSTRUCTIVE SLEEP APNEA   . Palpitations   . SYSTOLIC HEART FAILURE, CHRONIC   . Depression   . CPAP (continuous positive airway pressure) dependence   . ACE-inhibitor cough   . Cardiac defibrillator in situ   . Chronic renal insufficiency   . GERD (gastroesophageal reflux disease)   . Hypothyroidism   . Hematuria   . Osteoarthritis (arthritis due to wear and tear of joints)   . Colon polyps   . Chronic rhinitis   . RLS (restless legs syndrome)   . Chronic back pain   . Arthritis   . Glaucoma   . Sleep apnea   . CHF (congestive heart failure) ([place])   . Dementia   . H/O colonoscopy 2005  . Anxiety   . Kidney disease   . Thyroid disease   . Generalized weakness   . Falls     Medications:   (Not in a hospital admission) Assessment: 76 YOF with acute mental status  changes. She has a history of UTI. UA in the ED cannot r/o UTI. Pharmacy consulted to start IV ceftriaxone for Sepsis likely from UTI. A urine cx from last year grew E.coli resistant to Zosyn but sensitive to ceftriaxone. WBC is wnl, Tm 100.64F. CrCl ~ 12 mL/min. SCr up significantly to 2.83 today (BL ~ 1.4)  Goal of Therapy:  Vancomycin trough level 15-20 mcg/ml  Plan:  -Start ceftriaxone 1 gm IV Q 24 hours -Vancomycin 1 gm IV once followed by Vanc 750 mg IV Q 48 hours -Monitor CBC, renal fx, cultures and clinical progress -[place] at [place], PharmD., BCPS Clinical Pharmacist Pager [phone]

## 2015-02-21 NOTE — H&P (Signed)
[facility] History and Physical  DENVER HARDER WIO:[id number] DOB: Oct 01, 1924 DOA: 02/21/2015  Referring physician: Emergency Department PCP: Hollace Kinnier, DO   CHIEF COMPLAINT: mental status changes  HPI: [name] [name] is a 81 y.o. female with non-ischemic cardiomyopathy. She has a cardiac defibrillator in place. Patient was brought to ED from the [facility] this am for mental status changes still be called patient's daughter around 6:40 AM, told her patient was not responding well. Daughter does not know when patient was last seen normal, presumably at bedtime. The facility has been trying to get a urinalysis from patient over the last several days but still stool had ontaminated the sample. Patient has a history of resistant UTIs.. Several days ago the patient complained of suprapubic pain her daughter.   ED COURSE: Temperature 100.8, heart rate 105, respirations 17, blood pressure 128/73. Patient is pancytopenic . She's had progressive thrombocytopenia. Platelets in November 2015 were 128, down to 55 today. Hemoglobin 10, this seems to be around her baseline. Patient has chronic kidney disease with baseline creatinine around 1.5 . Today BUN 62, creatinine 2.83. Chest x-ray without acute abnormalities . Urinalysis with granular cast, 21-50 WBCs.   EKG: sinus tachycardia and RBBB  Medications  cefTRIAXone (ROCEPHIN) 1 g in dextrose 5 % 50 mL IVPB (0 g Intravenous Stopped 02/21/15 1040)  vancomycin (VANCOCIN) IVPB 1000 mg/200 mL premix (1,000 mg Intravenous New Bag/Given 02/21/15 1045)  0.9 %  sodium chloride infusion ( Intravenous New Bag/Given 02/21/15 1045)    REVIEW OF SYSTEMS:  Unobtainable. Patient with altered mental status.   Past Medical History  Diagnosis Date  . Atrial fibrillation ([place])   . BRONCHITIS, CHRONIC   . CHEST PAIN   . HYPOKALEMIA   . OBSTRUCTIVE SLEEP APNEA   . Palpitations   . SYSTOLIC HEART FAILURE, CHRONIC   . Depression   .  CPAP (continuous positive airway pressure) dependence   . ACE-inhibitor cough   . Cardiac defibrillator in situ   . Chronic renal insufficiency   . GERD (gastroesophageal reflux disease)   . Hypothyroidism   . Hematuria   . Osteoarthritis (arthritis due to wear and tear of joints)   . Colon polyps   . Chronic rhinitis   . RLS (restless legs syndrome)   . Chronic back pain   . Arthritis   . Glaucoma   . Sleep apnea   . CHF (congestive heart failure) ([place])   . Dementia   . H/O colonoscopy 2005  . Anxiety   . Kidney disease   . Thyroid disease   . Generalized weakness   . Falls    Past Surgical History  Procedure Laterality Date  . Back surgery  1973  . Cardiac defibrillator placement  2005     [facility] [phone]  . Skin cancer excision    . Cardiac catheterization  07/29/2003     Congestive heart failure, questionable etiology.  Increased LVEDP of 78mmHg on cath --  Nonobstructive coronary disease --         . Insert / replace / remove pacemaker  01/10/2004    Aborted biventricular implantable cardiac defibrillator implantation, complicated by probable pneumothorax  --  Deboraha Sprang, M.D.  . Cardiac defibrillator placement  01/15/2004    St. Jude 1581 65 cm dual coil active fixation defibrillator lead, serial number [id number] --  St. Jude Atlas +HF model V340CRT defibrillator, serial [id number]. -- Biventricular implantable cardioverter-defibrillator implantation, contrast venography and intraoperative defibrillation threshold testing --  SURGEON:  Deboraha Sprang, M.D.  . Implantable cardioverter defibrillator implant  03/17/2009    generator change  --  Unify St. Jude ICD model O6296183, serial number [id number]  -- Deboraha Sprang, MD, [facility]   . Femur fracture surgery  2010  . Esophagogastroduodenoscopy  04/13/2011    Procedure: ESOPHAGOGASTRODUODENOSCOPY (EGD);  Surgeon: Zenovia Jarred, MD;  Location: [facility];  Service: Gastroenterology;  Laterality: N/A;  may need dilation    Social History:  reports that she quit smoking about 11 years ago. Her smoking use included Cigarettes. She smoked 1.00 pack per day. She has never used smokeless tobacco. She reports that she does not drink alcohol or use illicit drugs. Lives:  At [employer] devices:   Transfers to chair with someone helping, otherwise bed-ridden.    Allergies  Allergen Reactions  . Ace Inhibitors Cough  . Requip [Ropinirole Hcl] Other (See Comments)    confusion    Family History  Problem Relation Age of Onset  . Heart disease Mother   . Colon cancer Mother   . Colon polyps Mother   . Kidney disease Mother   . Emphysema Father   . Liver disease Brother   . Kidney disease Maternal Grandmother   . Breast cancer Maternal Aunt   . Heart disease Father     Prior to Admission medications   Medication Sig Start Date End Date Taking? Authorizing Provider  allopurinol (ZYLOPRIM) 100 MG tablet Take 1 tablet by mouth daily for Gout 02/18/14   Lauree Chandler, NP  AMBULATORY NON FORMULARY MEDICATION Wheelchair and Cushion. Patient is unable to propel herself-she is nonambulatory 05/28/14   [name] L Reed, DO  aspirin EC 81 MG tablet Take 1 tablet (81 mg total) by mouth daily. 10/14/11   Deboraha Sprang, MD  Calcium Carb-Cholecalciferol (OYSTER SHELL CALCIUM + D PO) Take by mouth. 1 by mouth four times daily with meals    Historical Provider, MD  Calcium Carbonate-Vitamin D (CALTRATE 600+D) 600-400 MG-UNIT per tablet Take one tablet by mouth four times daily with meals 11/19/14   Lauree Chandler, NP  carvedilol (COREG) 12.5 MG tablet Take 1/2 tablet by mouth twice daily Patient taking differently: Take 6.25 mg by mouth 2 (two) times daily with a meal.  02/18/14   Lauree Chandler, NP  Cholecalciferol (D3-50) 50000 UNITS capsule Take one capsule by mouth once weekly on Wednesdays for vitamin d 02/10/15   [name] L Reed, DO  Cranberry-Vitamin C-Inulin (UTI-STAT) LIQD Taking 30 ml by mouth  every morning 11/19/14   Lauree Chandler, NP  D3-50 50000 UNITS capsule TAKE ONE CAPSULE BY MOUTH ONCE WEEKLY ON [facility] FOR VITAMIN D 02/10/15   [name] L Reed, DO  fentaNYL (DURAGESIC - DOSED MCG/HR) 25 MCG/HR patch Place 1 patch onto the skin every 3 days for pain 02/21/15   [name] L Reed, DO  furosemide (LASIX) 40 MG tablet Take 1 tablet by mouth 2 times daily for Edema 02/18/14   Lauree Chandler, NP  hydrALAZINE (APRESOLINE) 10 MG tablet Take 1 tablet by mouth 3 times daily, breakfast, 2:00 pm and bedtime for BP 02/18/14   Lauree Chandler, NP  HYDROcodone-acetaminophen (NORCO/VICODIN) 5-325 MG tablet Take one tablet by mouth every 6 hours as needed for leg pain 02/21/15   [name] L Reed, DO  isosorbide mononitrate (IMDUR) 30 MG 24 hr tablet Take 1 tablet by mouth daily for BP 02/18/14   Lauree Chandler, NP  KLOR-CON M20 20 MEQ tablet TAKE 2 TABLETS BY MOUTH TWICE A DAY FOR HYPOKALEMIA 10/18/14   [name] L Reed, DO  levothyroxine (SYNTHROID, LEVOTHROID) 100 MCG tablet Take 1 tablet (100 mcg total) by mouth daily. 10/28/14   [name] L Reed, DO  memantine (NAMENDA XR) 28 MG CP24 24 hr capsule Take one tablet by mouth once daily for memory 11/27/14   Estill Dooms, MD  Memantine HCl ER (NAMENDA XR TITRATION PACK) 7 & 14 & 21 &28 MG CP24 7 mg one daily for 7 days, then  14 mg one daily for 14 days, then 21 mg one daily for 21 days, the 28 mg one daily there after for dementra 10/18/14   [name] L Reed, DO  pantoprazole (PROTONIX) 40 MG tablet Take 1 tablet by mouth 2 times daily before a meal for GERD 05/16/14   [name] L Reed, DO  polyethylene glycol (MIRALAX / GLYCOLAX) packet Take 17 g by mouth daily. For constipation. Mix with Cranberry juice in the morning 02/10/15   [name] L Reed, DO  polyvinyl alcohol (LIQUIFILM TEARS) 1.4 % ophthalmic solution Place 1 drop into both eyes 3 (three) times daily. 08/20/13   [name] L Reed, DO  QUEtiapine (SEROQUEL) 50 MG tablet Take 50 mg by mouth at bedtime.     Historical Provider, MD  risperiDONE (RISPERDAL) 0.5 MG tablet TAKE ONE TABLET BY MOUTH EVERY 6 HOURS AS NEEDED FOR AGITATION. 04/15/14   [name] L Reed, DO  senna-docusate (SENOKOT-S) 8.6-50 MG per tablet Take 2 tablets by mouth daily. Hold for loose stools 03/21/14   [name] L Reed, DO  sertraline (ZOLOFT) 100 MG tablet Take 1 tablet by mouth daily for Depression 02/13/15   [name] L Reed, DO   PHYSICAL EXAM: Filed Vitals:   02/21/15 1015 02/21/15 1030 02/21/15 1045 02/21/15 1100  BP: 126/68 128/63 146/75 147/77  Pulse: 98 97 101 102  Temp:      TempSrc:      Resp: 12 23 26 25   SpO2: 93% 93% 94% 93%    Wt Readings from Last 3 Encounters:  10/24/14 57.425 kg (126 lb 9.6 oz)  06/20/14 56.065 kg (123 lb 9.6 oz)  03/21/14 58.695 kg (129 lb 6.4 oz)    General:  Frail appearing white female,  Appears calm and comfortable Eyes: PER, normal lids, irises & conjunctiva ENT: grossly normal hearing, dry lips & dry tongue Neck: no LAD, masses  Cardiovascular: tachycardia, No LE edema.  Respiratory: Respirations even and unlabored. Normal respiratory effort. Lungs CTA bilaterally, no wheezes / rales .   Abdomen: soft, non-distended, non-tender, active bowel sounds. No obvious masses.  Skin: DRY. no rash seen on limited exam. BLE skin color changes with thickening Musculoskeletal:  Psychiatric: responds to commands. Makes eye contacts. Struggles to answer yes or no to basic questions.  Neurologic: grossly non-focal.         Labs on Admission:  Basic Metabolic Panel:  Recent Labs Lab 02/21/15 0807  NA 145  K 4.4  CL 109  CO2 24  GLUCOSE 143*  BUN 62*  CREATININE 2.83*  CALCIUM 9.7   Liver Function Tests:  Recent Labs Lab 02/21/15 0807  AST 25  ALT 12*  ALKPHOS 68  BILITOT 1.0  PROT 7.7  ALBUMIN 3.8    CBC:  Recent Labs Lab 02/21/15 0807  WBC 3.1*  HGB 10.0*  HCT 32.6*  MCV 100.0  PLT 55*   CBG:  Recent Labs Lab 02/21/15 0816  GLUCAP 134*  Radiological Exams on Admission: Dg Chest Port 1 View  02/21/2015  CLINICAL DATA:  Altered mental status EXAM: PORTABLE CHEST 1 VIEW COMPARISON:  January 10, 2015 FINDINGS: There is no edema or consolidation. Heart size and pulmonary vascularity are within normal limits. Pacemaker leads are attached to the right atrium right ventricle, stable. There is atherosclerotic change in aorta. No adenopathy. No bone lesions. IMPRESSION: No edema or consolidation.  No change in cardiac silhouette. Electronically Signed   By: Lowella Grip III M.D.   On: 02/21/2015 10:08    ASSESSMENT / PLAN   Sepsis secondary to UTI. Presenting with mental status changes, temp 100.8, respirations in the twenties, mild tachycardia. Fluid resuscitation in ED. She is hemodynamically stable - Admit to medical bed. - Blood and urine cx - IV Rocephin and IV vancomycin per pharmacy  Acute encephalopathy.Probably secondary to UTI/sepsis. Should improve with antibiotics. Patient has dementia, on Namenda. At baseline patient is nearly bedbound though able to communicate effectively per daughter  Dehydration, related to #1 . Dry mucous membranes.  -IV fluid resuscitation in progress  Acute on chronic kidney disease.. Creatinine 2.8 with baseline of 1.5. Should improve with fluid resuscitation /treatment of sepsis. Hold home diurectics.  Check am renal function. If no improvement consider renal ultrasound.   Pancytopenia. Baseline platelet count 125. She's had progressive thrombocytopenia since March of this year and now platelets down to 55. This may be related to an underlying hematologic problem. No splenomegaly noted on CT scan March 2015. SCDs for DVT prophylaxis. Monitor counts.   Hypothyroidism, on replacement therapy. Last TSH several months ago, will recheck one today  Chronic constipation. Resolved after starting daily Miralax which actually caused loose stools. Hold Miralax for now.   Hx of CVA. Current  mental status changes most likely due to sepsis related encephalopathy. Head CTscan negative.   Consultants.  none  Code Status: Limited code (though made DNR at recent PCP visit). Daughter wants limited code until official paperwork brought from home.  DVT Prophylaxis: SCDs Family Communication: . Daughter Nevin Bloodgood at bedside.  Disposition Plan: Back to [facility] in 2-3 days.    Time spent: 60 minutes Tye Savoy  NP [facility] Pager [phone]

## 2015-02-21 NOTE — Progress Notes (Signed)
[name] [name] [id number] Admission Data: 02/21/2015 4:15 PM Attending Provider: Norval Morton, MD  TEL:MRAJ, [name], DO Consults/ Treatment Team:    [name] [name] is a 81 y.o. female patient admitted from ED to [facility] to 5[facility] awake, alert  & orientated  X 2,  DNR, VSS - Blood pressure 151/69, pulse 94, temperature 98.2 F (36.8 C), temperature source Oral, resp. rate 20, height 5\' 2"  (1.575 m), weight 59.5 kg (131 lb 2.8 oz), SpO2 96 %., no c/o shortness of breath, no c/o chest pain, no distress noted.    IV site WDL:  Left hand.  Allergies:   Allergies  Allergen Reactions  . Ace Inhibitors Cough  . Requip [Ropinirole Hcl] Other (See Comments)    confusion     Past Medical History  Diagnosis Date  . Atrial fibrillation ([place])   . BRONCHITIS, CHRONIC   . CHEST PAIN   . HYPOKALEMIA   . OBSTRUCTIVE SLEEP APNEA   . Palpitations   . SYSTOLIC HEART FAILURE, CHRONIC   . Depression   . CPAP (continuous positive airway pressure) dependence   . ACE-inhibitor cough   . Cardiac defibrillator in situ   . Chronic renal insufficiency   . GERD (gastroesophageal reflux disease)   . Hypothyroidism   . Hematuria   . Osteoarthritis (arthritis due to wear and tear of joints)   . Colon polyps   . Chronic rhinitis   . RLS (restless legs syndrome)   . Chronic back pain   . Arthritis   . Glaucoma   . Sleep apnea   . CHF (congestive heart failure) ([place])   . Dementia   . H/O colonoscopy 2005  . Anxiety   . Kidney disease   . Thyroid disease   . Generalized weakness   . Falls      Pt orientation to unit, room and routine. Information packet given to patient/family and safety video watched.  Admission INP armband ID verified with patient/family, and in place. SR up x 2, fall risk assessment complete with Patient and family verbalizing understanding of risks associated with falls. Pt verbalizes an understanding of how to use the call bell and to call for help before  getting out of bed.  Bruising noted to body primarily to lower legs.      Will cont to monitor and assist as needed.  Dayle Points, RN 02/21/2015 4:15 PM

## 2015-02-21 NOTE — ED Provider Notes (Signed)
CSN: [id number]     Arrival date & time 02/21/15  8182 History   First MD Initiated Contact with Patient 02/21/15 [phone]     Chief Complaint  Patient presents with  . Altered Mental Status     (Consider location/radiation/quality/duration/timing/severity/associated sxs/prior Treatment) Patient is a 81 y.o. female presenting with altered mental status.  Altered Mental Status Presenting symptoms: confusion and lethargy   Severity:  Moderate Most recent episode:  Today Episode history:  Single Duration:  1 day Timing:  Constant Progression:  Worsening Chronicity:  New Context: dementia and nursing home resident   Context comment:  Daughter states she was complaining of suprapubic pain earlier in the week. Associated symptoms: no fever, no nausea and no vomiting     Past Medical History  Diagnosis Date  . Atrial fibrillation ([place])   . BRONCHITIS, CHRONIC   . CHEST PAIN   . HYPOKALEMIA   . OBSTRUCTIVE SLEEP APNEA   . Palpitations   . SYSTOLIC HEART FAILURE, CHRONIC   . Depression   . CPAP (continuous positive airway pressure) dependence   . ACE-inhibitor cough   . Cardiac defibrillator in situ   . Chronic renal insufficiency   . GERD (gastroesophageal reflux disease)   . Hypothyroidism   . Hematuria   . Osteoarthritis (arthritis due to wear and tear of joints)   . Colon polyps   . Chronic rhinitis   . RLS (restless legs syndrome)   . Chronic back pain   . Arthritis   . Glaucoma   . Sleep apnea   . CHF (congestive heart failure) ([place])   . Dementia   . H/O colonoscopy 2005  . Anxiety   . Kidney disease   . Thyroid disease   . Generalized weakness   . Falls    Past Surgical History  Procedure Laterality Date  . Back surgery  1973  . Cardiac defibrillator placement  2005     [facility] [phone]  . Skin cancer excision    . Cardiac catheterization  07/29/2003     Congestive heart failure, questionable etiology.  Increased LVEDP of 49mmHg on cath --   Nonobstructive coronary disease --         . Insert / replace / remove pacemaker  01/10/2004    Aborted biventricular implantable cardiac defibrillator implantation, complicated by probable pneumothorax  --  Deboraha Sprang, M.D.  . Cardiac defibrillator placement  01/15/2004    St. Jude 1581 65 cm dual coil active fixation defibrillator lead, serial number [id number] --  St. Jude Atlas +HF model V340CRT defibrillator, serial [id number]. -- Biventricular implantable cardioverter-defibrillator implantation, contrast venography and intraoperative defibrillation threshold testing -- SURGEON:  Deboraha Sprang, M.D.  . Implantable cardioverter defibrillator implant  03/17/2009    generator change  --  Unify St. Jude ICD model O6296183, serial number [id number]  -- Deboraha Sprang, MD, [facility]   . Femur fracture surgery  2010  . Esophagogastroduodenoscopy  04/13/2011    Procedure: ESOPHAGOGASTRODUODENOSCOPY (EGD);  Surgeon: Zenovia Jarred, MD;  Location: [facility];  Service: Gastroenterology;  Laterality: N/A;  may need dilation   Family History  Problem Relation Age of Onset  . Heart disease Mother   . Colon cancer Mother   . Colon polyps Mother   . Kidney disease Mother   . Emphysema Father   . Liver disease Brother   . Kidney disease Maternal Grandmother   . Breast cancer Maternal Aunt   . Heart disease Father  Social History  Substance Use Topics  . Smoking status: Former Smoker -- 1.00 packs/day    Types: Cigarettes    Quit date: 04/10/2003  . Smokeless tobacco: Never Used  . Alcohol Use: No   OB History    No data available     Review of Systems  Unable to perform ROS: Mental status change  Constitutional: Negative for fever.  Gastrointestinal: Negative for nausea and vomiting.  Psychiatric/Behavioral: Positive for confusion.      Allergies  Ace inhibitors and Requip  Home Medications   Prior to Admission medications   Medication Sig Start Date End Date Taking? Authorizing Provider   allopurinol (ZYLOPRIM) 100 MG tablet Take 1 tablet by mouth daily for Gout 02/18/14   Lauree Chandler, NP  AMBULATORY NON FORMULARY MEDICATION Wheelchair and Cushion. Patient is unable to propel herself-she is nonambulatory 05/28/14   [name] L Reed, DO  aspirin EC 81 MG tablet Take 1 tablet (81 mg total) by mouth daily. 10/14/11   Deboraha Sprang, MD  Calcium Carb-Cholecalciferol (OYSTER SHELL CALCIUM + D PO) Take by mouth. 1 by mouth four times daily with meals    Historical Provider, MD  Calcium Carbonate-Vitamin D (CALTRATE 600+D) 600-400 MG-UNIT per tablet Take one tablet by mouth four times daily with meals 11/19/14   Lauree Chandler, NP  carvedilol (COREG) 12.5 MG tablet Take 1/2 tablet by mouth twice daily Patient taking differently: Take 6.25 mg by mouth 2 (two) times daily with a meal.  02/18/14   Lauree Chandler, NP  Cholecalciferol (D3-50) 50000 UNITS capsule Take one capsule by mouth once weekly on Wednesdays for vitamin d 02/10/15   [name] L Reed, DO  Cranberry-Vitamin C-Inulin (UTI-STAT) LIQD Taking 30 ml by mouth every morning 11/19/14   Lauree Chandler, NP  D3-50 50000 UNITS capsule TAKE ONE CAPSULE BY MOUTH ONCE WEEKLY ON [facility] FOR VITAMIN D 02/10/15   [name] L Reed, DO  fentaNYL (DURAGESIC - DOSED MCG/HR) 25 MCG/HR patch Place 1 patch onto the skin every 3 days for pain 02/21/15   [name] L Reed, DO  furosemide (LASIX) 40 MG tablet Take 1 tablet by mouth 2 times daily for Edema 02/18/14   Lauree Chandler, NP  hydrALAZINE (APRESOLINE) 10 MG tablet Take 1 tablet by mouth 3 times daily, breakfast, 2:00 pm and bedtime for BP 02/18/14   Lauree Chandler, NP  HYDROcodone-acetaminophen (NORCO/VICODIN) 5-325 MG tablet Take one tablet by mouth every 6 hours as needed for leg pain 02/21/15   [name] L Reed, DO  isosorbide mononitrate (IMDUR) 30 MG 24 hr tablet Take 1 tablet by mouth daily for BP 02/18/14   Lauree Chandler, NP  KLOR-CON M20 20 MEQ tablet TAKE 2 TABLETS BY MOUTH  TWICE A DAY FOR HYPOKALEMIA 10/18/14   [name] L Reed, DO  levothyroxine (SYNTHROID, LEVOTHROID) 100 MCG tablet Take 1 tablet (100 mcg total) by mouth daily. 10/28/14   [name] L Reed, DO  memantine (NAMENDA XR) 28 MG CP24 24 hr capsule Take one tablet by mouth once daily for memory 11/27/14   Estill Dooms, MD  Memantine HCl ER (NAMENDA XR TITRATION PACK) 7 & 14 & 21 &28 MG CP24 7 mg one daily for 7 days, then  14 mg one daily for 14 days, then 21 mg one daily for 21 days, the 28 mg one daily there after for dementra 10/18/14   [name] L Reed, DO  pantoprazole (PROTONIX) 40 MG tablet Take 1 tablet  by mouth 2 times daily before a meal for GERD 05/16/14   [name] L Reed, DO  polyethylene glycol (MIRALAX / GLYCOLAX) packet Take 17 g by mouth daily. For constipation. Mix with Cranberry juice in the morning 02/10/15   [name] L Reed, DO  polyvinyl alcohol (LIQUIFILM TEARS) 1.4 % ophthalmic solution Place 1 drop into both eyes 3 (three) times daily. 08/20/13   [name] L Reed, DO  QUEtiapine (SEROQUEL) 50 MG tablet Take 50 mg by mouth at bedtime.    Historical Provider, MD  risperiDONE (RISPERDAL) 0.5 MG tablet TAKE ONE TABLET BY MOUTH EVERY 6 HOURS AS NEEDED FOR AGITATION. 04/15/14   [name] L Reed, DO  senna-docusate (SENOKOT-S) 8.6-50 MG per tablet Take 2 tablets by mouth daily. Hold for loose stools 03/21/14   [name] L Reed, DO  sertraline (ZOLOFT) 100 MG tablet Take 1 tablet by mouth daily for Depression 02/13/15   [name] L Reed, DO   BP 137/70 mmHg  Pulse 101  Temp(Src) 100.8 F (38.2 C) (Rectal)  Resp 20  SpO2 92% Physical Exam  Constitutional: She is oriented to person, place, and time. She appears well-developed and well-nourished. No distress.  HENT:  Head: Normocephalic and atraumatic.  Mouth/Throat: Mucous membranes are dry.  Eyes: Conjunctivae are normal. Pupils are equal, round, and reactive to light. No scleral icterus.  Neck: Neck supple.  Cardiovascular: Normal rate, regular rhythm,  normal heart sounds and intact distal pulses.   No murmur heard. Pulmonary/Chest: Effort normal. No stridor. No respiratory distress. She has rales (bibasilar).  Abdominal: Soft. Bowel sounds are normal. She exhibits no distension. There is no tenderness. There is no rigidity, no rebound and no guarding.  Musculoskeletal: Normal range of motion.  Neurological: She is alert and oriented to person, place, and time.  Skin: Skin is warm and dry. No rash noted.  Psychiatric: She has a normal mood and affect. Her behavior is normal.  Nursing note and vitals reviewed.   ED Course  Procedures (including critical care time) Labs Review Labs Reviewed  COMPREHENSIVE METABOLIC PANEL - Abnormal; Notable for the following:    Glucose, Bld 143 (*)    BUN 62 (*)    Creatinine, Ser 2.83 (*)    ALT 12 (*)    GFR calc non Af Amer 14 (*)    GFR calc Af Amer 16 (*)    All other components within normal limits  CBC - Abnormal; Notable for the following:    WBC 3.1 (*)    RBC 3.26 (*)    Hemoglobin 10.0 (*)    HCT 32.6 (*)    RDW 16.5 (*)    Platelets 55 (*)    All other components within normal limits  URINALYSIS, ROUTINE W REFLEX MICROSCOPIC (NOT AT [facility]) - Abnormal; Notable for the following:    APPearance CLOUDY (*)    Hgb urine dipstick TRACE (*)    Leukocytes, UA MODERATE (*)    All other components within normal limits  URINE MICROSCOPIC-ADD ON - Abnormal; Notable for the following:    Bacteria, UA FEW (*)    Casts GRANULAR CAST (*)    All other components within normal limits  CBG MONITORING, ED - Abnormal; Notable for the following:    Glucose-Capillary 134 (*)    All other components within normal limits  I-STAT CG4 LACTIC ACID, ED  Randolm Idol, ED    Imaging Review Dg Chest Port 1 View  02/21/2015  CLINICAL DATA:  Altered mental status EXAM:  PORTABLE CHEST 1 VIEW COMPARISON:  January 10, 2015 FINDINGS: There is no edema or consolidation. Heart size and pulmonary  vascularity are within normal limits. Pacemaker leads are attached to the right atrium right ventricle, stable. There is atherosclerotic change in aorta. No adenopathy. No bone lesions. IMPRESSION: No edema or consolidation.  No change in cardiac silhouette. Electronically Signed   By: Lowella Grip III M.D.   On: 02/21/2015 10:08   I have personally reviewed and evaluated these images and lab results as part of my medical decision-making.   EKG Interpretation   Date/Time:  Friday February 21 2015 10:36:11 EDT Ventricular Rate:  100 PR Interval:  149 QRS Duration: 140 QT Interval:  379 QTC Calculation: 489 R Axis:   -22 Text Interpretation:  Sinus tachycardia Left bundle branch block Confirmed  by [facility]  MD, TREY ([id number]) on 02/21/2015 9:30:16 PM      MDM   Final diagnoses:  Encephalopathy acute  Dehydration  Acute on chronic renal failure (HCC)  UTI (lower urinary tract infection)    81 yo female with AMS.  Low grade fever here.  Presentation and labs consistent with UTI.  Has acute kidney injury as well.  Will fluid hydrate.  Rocephin IV.  Admit.     Serita Grit, MD 02/21/15 2130

## 2015-02-21 NOTE — ED Notes (Signed)
Pt brought in EMS from [facility].  Facility employees reported that pt has AMS that was noticed this morning.  Per workers "she was normal yesterday that I know of."  Pt has a hx of UTI's.

## 2015-02-21 NOTE — Telephone Encounter (Signed)
[facility] Fax Request

## 2015-02-22 DIAGNOSIS — G2581 Restless legs syndrome: Secondary | ICD-10-CM | POA: Diagnosis present

## 2015-02-22 DIAGNOSIS — N183 Chronic kidney disease, stage 3 (moderate): Secondary | ICD-10-CM

## 2015-02-22 DIAGNOSIS — Z66 Do not resuscitate: Secondary | ICD-10-CM | POA: Diagnosis present

## 2015-02-22 DIAGNOSIS — D61818 Other pancytopenia: Secondary | ICD-10-CM | POA: Diagnosis present

## 2015-02-22 DIAGNOSIS — Z8673 Personal history of transient ischemic attack (TIA), and cerebral infarction without residual deficits: Secondary | ICD-10-CM | POA: Diagnosis not present

## 2015-02-22 DIAGNOSIS — Z888 Allergy status to other drugs, medicaments and biological substances status: Secondary | ICD-10-CM | POA: Diagnosis not present

## 2015-02-22 DIAGNOSIS — I5022 Chronic systolic (congestive) heart failure: Secondary | ICD-10-CM | POA: Diagnosis present

## 2015-02-22 DIAGNOSIS — N179 Acute kidney failure, unspecified: Secondary | ICD-10-CM | POA: Diagnosis present

## 2015-02-22 DIAGNOSIS — E860 Dehydration: Secondary | ICD-10-CM

## 2015-02-22 DIAGNOSIS — R6521 Severe sepsis with septic shock: Secondary | ICD-10-CM | POA: Diagnosis present

## 2015-02-22 DIAGNOSIS — A419 Sepsis, unspecified organism: Secondary | ICD-10-CM | POA: Diagnosis present

## 2015-02-22 DIAGNOSIS — E871 Hypo-osmolality and hyponatremia: Secondary | ICD-10-CM | POA: Diagnosis present

## 2015-02-22 DIAGNOSIS — E876 Hypokalemia: Secondary | ICD-10-CM | POA: Diagnosis present

## 2015-02-22 DIAGNOSIS — Z86010 Personal history of colonic polyps: Secondary | ICD-10-CM | POA: Diagnosis not present

## 2015-02-22 DIAGNOSIS — M1990 Unspecified osteoarthritis, unspecified site: Secondary | ICD-10-CM | POA: Diagnosis present

## 2015-02-22 DIAGNOSIS — N390 Urinary tract infection, site not specified: Secondary | ICD-10-CM | POA: Diagnosis present

## 2015-02-22 DIAGNOSIS — E039 Hypothyroidism, unspecified: Secondary | ICD-10-CM | POA: Diagnosis present

## 2015-02-22 DIAGNOSIS — Z87440 Personal history of urinary (tract) infections: Secondary | ICD-10-CM | POA: Diagnosis not present

## 2015-02-22 DIAGNOSIS — F0390 Unspecified dementia without behavioral disturbance: Secondary | ICD-10-CM | POA: Diagnosis present

## 2015-02-22 DIAGNOSIS — K5900 Constipation, unspecified: Secondary | ICD-10-CM | POA: Diagnosis present

## 2015-02-22 DIAGNOSIS — E870 Hyperosmolality and hypernatremia: Secondary | ICD-10-CM | POA: Diagnosis present

## 2015-02-22 DIAGNOSIS — I4891 Unspecified atrial fibrillation: Secondary | ICD-10-CM | POA: Diagnosis present

## 2015-02-22 DIAGNOSIS — K219 Gastro-esophageal reflux disease without esophagitis: Secondary | ICD-10-CM | POA: Diagnosis present

## 2015-02-22 DIAGNOSIS — Z87891 Personal history of nicotine dependence: Secondary | ICD-10-CM | POA: Diagnosis not present

## 2015-02-22 DIAGNOSIS — H409 Unspecified glaucoma: Secondary | ICD-10-CM | POA: Diagnosis present

## 2015-02-22 DIAGNOSIS — D696 Thrombocytopenia, unspecified: Secondary | ICD-10-CM | POA: Diagnosis present

## 2015-02-22 DIAGNOSIS — R6510 Systemic inflammatory response syndrome (SIRS) of non-infectious origin without acute organ dysfunction: Secondary | ICD-10-CM | POA: Diagnosis present

## 2015-02-22 DIAGNOSIS — G4733 Obstructive sleep apnea (adult) (pediatric): Secondary | ICD-10-CM | POA: Diagnosis present

## 2015-02-22 DIAGNOSIS — Z85828 Personal history of other malignant neoplasm of skin: Secondary | ICD-10-CM | POA: Diagnosis not present

## 2015-02-22 DIAGNOSIS — G9340 Encephalopathy, unspecified: Secondary | ICD-10-CM | POA: Diagnosis present

## 2015-02-22 DIAGNOSIS — Z95810 Presence of automatic (implantable) cardiac defibrillator: Secondary | ICD-10-CM | POA: Diagnosis not present

## 2015-02-22 DIAGNOSIS — I429 Cardiomyopathy, unspecified: Secondary | ICD-10-CM | POA: Diagnosis present

## 2015-02-22 LAB — CBC
HCT: 26.7 % — ABNORMAL LOW (ref 36.0–46.0)
HEMOGLOBIN: 8.3 g/dL — AB (ref 12.0–15.0)
MCH: 31.4 pg (ref 26.0–34.0)
MCHC: 31.1 g/dL (ref 30.0–36.0)
MCV: 101.1 fL — ABNORMAL HIGH (ref 78.0–100.0)
PLATELETS: 41 10*3/uL — AB (ref 150–400)
RBC: 2.64 MIL/uL — ABNORMAL LOW (ref 3.87–5.11)
RDW: 16.6 % — AB (ref 11.5–15.5)
WBC: 3.8 10*3/uL — ABNORMAL LOW (ref 4.0–10.5)

## 2015-02-22 LAB — COMPREHENSIVE METABOLIC PANEL
ALBUMIN: 3.2 g/dL — AB (ref 3.5–5.0)
ALK PHOS: 46 U/L (ref 38–126)
ALT: 10 U/L — ABNORMAL LOW (ref 14–54)
ANION GAP: 11 (ref 5–15)
AST: 24 U/L (ref 15–41)
BILIRUBIN TOTAL: 0.7 mg/dL (ref 0.3–1.2)
BUN: 63 mg/dL — ABNORMAL HIGH (ref 6–20)
CALCIUM: 9.2 mg/dL (ref 8.9–10.3)
CO2: 23 mmol/L (ref 22–32)
CREATININE: 2.09 mg/dL — AB (ref 0.44–1.00)
Chloride: 117 mmol/L — ABNORMAL HIGH (ref 101–111)
GFR calc Af Amer: 23 mL/min — ABNORMAL LOW (ref >60)
GFR calc non Af Amer: 20 mL/min — ABNORMAL LOW (ref >60)
GLUCOSE: 105 mg/dL — AB (ref 65–99)
Potassium: 3.7 mmol/L (ref 3.5–5.1)
Sodium: 151 mmol/L — ABNORMAL HIGH (ref 135–145)
TOTAL PROTEIN: 6 g/dL — AB (ref 6.5–8.1)

## 2015-02-22 MED ORDER — DEXTROSE 5 % IV SOLN
INTRAVENOUS | Status: DC
  Administered 2015-02-22 – 2015-02-23 (×3): via INTRAVENOUS
  Administered 2015-02-23: 1000 mL via INTRAVENOUS

## 2015-02-22 NOTE — Progress Notes (Addendum)
[facility]                                                                              Patient Demographics  [name] [name], is a 81 y.o. female, DOB - 1925-01-15, FSF:[id number]  Admit date - 02/21/2015   Admitting Physician Norval Morton, MD  Outpatient Primary MD for the patient is REED, [name], DO  LOS -    Chief Complaint  Patient presents with  . Altered Mental Status       Brief HPI   per Dr. Tamala Julian on 02/21/15 [name] [name] is a 81 y.o. female with non-ischemic cardiomyopathy. She has a cardiac defibrillator in place. Patient was brought to ED from the [facility] this am for mental status changes still be called patient's daughter around 6:40 AM, told her patient was not responding well. Daughter does not know when patient was last seen normal, presumably at bedtime. The facility has been trying to get a urinalysis from patient over the last several days but still stool had contaminated the sample. Patient has a history of resistant UTIs. Several days ago the patient complained of suprapubic pain   ED COURSE: Temperature 100.8, heart rate 105, respirations 17, blood pressure 128/73. Patient is pancytopenic . She's had progressive thrombocytopenia. Platelets in November 2015 were 128, down to 55 on the day of admission.. Hemoglobin 10, this seems to be around her baseline. Patient has chronic kidney disease with baseline creatinine around 1.5 . Today BUN 62, creatinine 2.83. Chest x-ray without acute abnormalities . Urinalysis with granular cast, 21-50 WBCs.     Assessment & Plan    Principal Problem:   Sepsis secondary to UTI (lower urinary tract infection), SIRS: Patient met service criteria at the time of admission with tachycardia, fever, source of infection being UTI - Continue IV Rocephin, hold off on vancomycin - Lactic acid 1.2, pro calcitonin less than 0.1 - Chest x-ray with no edema or consolidation - Follow urine culture and  sensitivity  Active Problems:   Acute on Chronic kidney disease (CKD), stage III (moderate) - Likely due to #1, on diuretics at home - Hold Lasix, continue gentle hydration, creatinine improving    Hypothyroidism - Obtain TSH, continue Synthroid    Dehydration -Continue gentle hydration     Encephalopathy acute Superimposed on dementia - Likely worsened due to UTI, hyponatremia, continue Seroquel    Hypernatremia - DC IV normal saline, placed on D5W    Pancytopenia (HCC) - Baseline in 80's. Likely worsened due to sepsis, UTI   Code Status: DO NOT RESUSCITATE  Family Communication: Discussed in detail with the patient, all imaging results, lab results explained to the patient . Discussed with patient's daughter in detail. Plan for DC in a.m. with ambulance to ALF.   Disposition Plan: Awaiting PT evaluation  Time Spent in minutes 25 minutes  Procedures  Chest x-ray  Consults   None   DVT Prophylaxis  SCDs  Medications  Scheduled Meds: . allopurinol  100 mg Oral Daily  . aspirin EC  81 mg Oral Daily  . carvedilol  6.25 mg Oral BID WC  . cefTRIAXone (ROCEPHIN)  IV  1 g Intravenous Q24H  . docusate sodium  100 mg Oral BID  . fentaNYL  25 mcg Transdermal Q72H  . isosorbide mononitrate  30 mg Oral Daily  . levothyroxine  100 mcg Oral QAC breakfast  . memantine  28 mg Oral Daily  . pantoprazole  40 mg Oral BID AC  . polyvinyl alcohol  1 drop Both Eyes TID  . QUEtiapine  50 mg Oral QHS  . sertraline  100 mg Oral Daily  . sodium chloride  3 mL Intravenous Q12H   Continuous Infusions: . dextrose 100 mL/hr at 02/22/15 1003   PRN Meds:.sodium chloride, acetaminophen **OR** acetaminophen, HYDROcodone-acetaminophen, ondansetron **OR** ondansetron (ZOFRAN) IV, polyethylene glycol, sodium chloride   Antibiotics   Anti-infectives    Start     Dose/Rate Route Frequency Ordered Stop   02/23/15 1100  vancomycin (VANCOCIN) IVPB 750 mg/150 ml premix  Status:  Discontinued       750 mg 150 mL/hr over 60 Minutes Intravenous Every 48 hours 02/21/15 1159 02/22/15 0945   02/21/15 1015  vancomycin (VANCOCIN) IVPB 1000 mg/200 mL premix     1,000 mg 200 mL/hr over 60 Minutes Intravenous  Once 02/21/15 1007 02/21/15 1145   02/21/15 1000  cefTRIAXone (ROCEPHIN) 1 g in dextrose 5 % 50 mL IVPB     1 g 100 mL/hr over 30 Minutes Intravenous Every 24 hours 02/21/15 0959          Subjective:   [name] was seen and examined today.Denies any specific complaints, improving, no fevers or chills, no shortness of breath.   Objective:   Blood pressure 128/57, pulse 96, temperature 98.7 F (37.1 C), temperature source Oral, resp. rate 18, height $RemoveBe'5\' 2"'[id number]$  (1.575 m), weight 59.5 kg (131 lb 2.8 oz), SpO2 96 %.  Wt Readings from Last 3 Encounters:  02/21/15 59.5 kg (131 lb 2.8 oz)  10/24/14 57.425 kg (126 lb 9.6 oz)  06/20/14 56.065 kg (123 lb 9.6 oz)     Intake/Output Summary (Last 24 hours) at 02/22/15 1201 Last data filed at 02/22/15 0951  Gross per 24 hour  Intake 1082.5 ml  Output      0 ml  Net 1082.5 ml    Exam  General: Alert and oriented x 2 NAD  HEENT:  PERRLA, EOMI, Anicteric Sclera, mucous membranes moist.   Neck: Supple, no JVD, no masses  CVS: S1 S2 auscultated, no rubs, murmurs or gallops. Regular rate and rhythm.  Respiratory: Clear to auscultation bilaterally, no wheezing, rales or rhonchi  Abdomen: Soft, nontender, nondistended, + bowel sounds  Ext: no cyanosis clubbing or edema  Neuro: AAOx2, Cr N's II- XII. Strength 5/5 upper and lower extremities bilaterally  Skin: No rashes  Psych: Normal affect and demeanor, alert and oriented x2    Data Review   Micro Results Recent Results (from the past 240 hour(s))  MRSA PCR Screening     Status: None   Collection Time: 02/21/15  3:40 PM  Result Value Ref Range Status   MRSA by PCR NEGATIVE NEGATIVE Final    Comment:        The GeneXpert MRSA Assay (FDA approved for NASAL  specimens only), is one component of a comprehensive MRSA colonization surveillance program. It is not intended to diagnose MRSA infection nor to guide or monitor treatment for MRSA infections.     Radiology Reports Dg Chest Port 1 View  02/21/2015  CLINICAL DATA:  Altered mental status EXAM: PORTABLE CHEST 1 VIEW COMPARISON:  January 10, 2015 FINDINGS: There is no edema or consolidation. Heart size and pulmonary vascularity are within normal limits. Pacemaker leads are attached to the right atrium right ventricle, stable. There is atherosclerotic change in aorta. No adenopathy. No bone lesions. IMPRESSION: No edema or consolidation.  No change in cardiac silhouette. Electronically Signed   By: Lowella Grip III M.D.   On: 02/21/2015 10:08    CBC  Recent Labs Lab 02/21/15 0807 02/22/15 0505  WBC 3.1* 3.8*  HGB 10.0* 8.3*  HCT 32.6* 26.7*  PLT 55* 41*  MCV 100.0 101.1*  MCH 30.7 31.4  MCHC 30.7 31.1  RDW 16.5* 16.6*    Chemistries   Recent Labs Lab 02/21/15 0807 02/22/15 0505  NA 145 151*  K 4.4 3.7  CL 109 117*  CO2 24 23  GLUCOSE 143* 105*  BUN 62* 63*  CREATININE 2.83* 2.09*  CALCIUM 9.7 9.2  AST 25 24  ALT 12* 10*  ALKPHOS 68 46  BILITOT 1.0 0.7   ------------------------------------------------------------------------------------------------------------------ estimated creatinine clearance is 14.7 mL/min (by C-G formula based on Cr of 2.09). ------------------------------------------------------------------------------------------------------------------ No results for input(s): HGBA1C in the last 72 hours. ------------------------------------------------------------------------------------------------------------------ No results for input(s): CHOL, HDL, LDLCALC, TRIG, CHOLHDL, LDLDIRECT in the last 72 hours. ------------------------------------------------------------------------------------------------------------------  Recent Labs   02/21/15 1650  TSH 0.442   ------------------------------------------------------------------------------------------------------------------ No results for input(s): VITAMINB12, FOLATE, FERRITIN, TIBC, IRON, RETICCTPCT in the last 72 hours.  Coagulation profile  Recent Labs Lab 02/21/15 1654  INR 1.21    No results for input(s): DDIMER in the last 72 hours.  Cardiac Enzymes No results for input(s): CKMB, TROPONINI, MYOGLOBIN in the last 168 hours.  Invalid input(s): CK ------------------------------------------------------------------------------------------------------------------ Invalid input(s): POCBNP   Recent Labs  02/21/15 0816  GLUCAP 134*     Durwin Davisson M.D. [facility] 02/22/2015, 12:01 PM  Pager: [phone] Between 7am to 7pm - call Pager - [phone]  After 7pm go to [email or web address] - password [email or web address]  Call night coverage person covering after 7pm

## 2015-02-22 NOTE — Evaluation (Signed)
Physical Therapy Evaluation Patient Details Name: [name] [name] MRN: [id number] DOB: [birth year] Today's Date: 02/22/2015   History of Present Illness  81 yo female with onset of septic shock from UTI was referred to PT,  PMHx:  CVA, CHF.  Clinical Impression  Pt will be able to return to ALF with some assistance but not clear on equipment needs.  However, will need continual care and if ALF cannot provide this will need to be in a SNF environment for a short stay if possible.  Pt is anxious and unable to get her to the chair but spoke with nursing to see if this can be done with 2 person assist as pt is willing to try.    Follow Up Recommendations Home health PT;Supervision/Assistance - 24 hour    Equipment Recommendations  None recommended by PT (do not have a list of equipment from this pt due to confusio)    Recommendations for Other Services       Precautions / Restrictions Precautions Precautions: Fall (telemetry) Restrictions Weight Bearing Restrictions: No      Mobility  Bed Mobility Overal bed mobility: Needs Assistance Bed Mobility: Supine to Sit;Rolling Rolling: Mod assist   Supine to sit: Max assist     General bed mobility comments: Pt is anxious about attempting to move unless she has shoes on but could not locate anything x clothing.  Has been up to bedside only with PT.  Transfers Overall transfer level: Needs assistance Equipment used: Rolling walker (2 wheeled);1 person hand held assist Transfers: Sit to/from Stand Sit to Stand: Max assist;From elevated surface         General transfer comment: Pt has poor follow through on instructions for mobiltiy, but can be assisted to partially stand.  Fearful without proper shoes and gave possibly a submax effort due to this fear.  Ambulation/Gait             General Gait Details: unable to attempt  Stairs            Wheelchair Mobility    Modified Rankin (Stroke Patients Only)        Balance Overall balance assessment: Needs assistance Sitting-balance support: Feet supported;Bilateral upper extremity supported Sitting balance-Leahy Scale: Poor     Standing balance support: Bilateral upper extremity supported Standing balance-Leahy Scale: Zero                               Pertinent Vitals/Pain Pain Assessment: No/denies pain    Home Living Family/patient expects to be discharged to:: Assisted living Living Arrangements: Other (Comment) (ALF) Available Help at Discharge: Available 24 hours/day Type of Home: Assisted living Home Access: Level entry     Home Layout: One level Home Equipment:  (Pt unable to explain)      Prior Function Level of Independence: Needs assistance   Gait / Transfers Assistance Needed: pt unable to explain the PLOF  ADL's / Homemaking Assistance Needed: assisted living for cooking and housekeeping        Hand Dominance        Extremity/Trunk Assessment   Upper Extremity Assessment: Generalized weakness           Lower Extremity Assessment: Generalized weakness      Cervical / Trunk Assessment: Kyphotic  Communication   Communication: No difficulties  Cognition Arousal/Alertness: Lethargic Behavior During Therapy: Anxious Overall Cognitive Status: No family/caregiver present to determine baseline cognitive functioning  Memory: Decreased short-term memory;Decreased recall of precautions              General Comments General comments (skin integrity, edema, etc.): Pt is going to need continual care and will anticipate ALF staff to help her, then HHPT to follow up as well due to weakness and possibly a functional decline    Exercises        Assessment/Plan    PT Assessment Patient needs continued PT services  PT Diagnosis Generalized weakness;Altered mental status   PT Problem List Decreased strength;Decreased range of motion;Decreased activity tolerance;Decreased  balance;Decreased mobility;Decreased coordination;Decreased cognition;Decreased knowledge of use of DME;Decreased knowledge of precautions;Decreased safety awareness;Cardiopulmonary status limiting activity  PT Treatment Interventions DME instruction;Functional mobility training;Therapeutic activities;Therapeutic exercise;Balance training;Neuromuscular re-education;Cognitive remediation;Patient/family education;Gait training   PT Goals (Current goals can be found in the Care Plan section) Acute Rehab PT Goals Patient Stated Goal: none stated x need shoes to stand PT Goal Formulation: Patient unable to participate in goal setting Time For Goal Achievement: 03/08/15 Potential to Achieve Goals: Fair    Frequency Min 3X/week   Barriers to discharge Other (comment) (will need ALF staff to assist all mobiltiy)      Co-evaluation               End of Session   Activity Tolerance: Patient limited by fatigue;Patient limited by lethargy;Treatment limited secondary to agitation Patient left: in bed;with call bell/phone within reach;with bed alarm set Nurse Communication: Mobility status;Other (comment) (cognitive concerns)    Functional Assessment Tool Used: clinical judgment Functional Limitation: Changing and maintaining body position Changing and Maintaining Body Position Current Status (Q0086): At least 60 percent but less than 80 percent impaired, limited or restricted Changing and Maintaining Body Position Goal Status (P6195): At least 60 percent but less than 80 percent impaired, limited or restricted    Time: 0938-1002 PT Time Calculation (min) (ACUTE ONLY): 24 min   Charges:   PT Evaluation $Initial PT Evaluation Tier I: 1 Procedure PT Treatments $Therapeutic Activity: 8-22 mins   PT G Codes:   PT G-Codes **NOT FOR INPATIENT CLASS** Functional Assessment Tool Used: clinical judgment Functional Limitation: Changing and maintaining body position Changing and Maintaining  Body Position Current Status (K9326): At least 60 percent but less than 80 percent impaired, limited or restricted Changing and Maintaining Body Position Goal Status (Z1245): At least 60 percent but less than 80 percent impaired, limited or restricted    Ramond Dial 02/22/2015, 12:56 PM   Mee Hives, PT MS Acute Rehab Dept. Number: [facility] [id number] and [place] [phone]

## 2015-02-23 LAB — BASIC METABOLIC PANEL
Anion gap: 11 (ref 5–15)
BUN: 46 mg/dL — ABNORMAL HIGH (ref 6–20)
CHLORIDE: 107 mmol/L (ref 101–111)
CO2: 25 mmol/L (ref 22–32)
CREATININE: 1.64 mg/dL — AB (ref 0.44–1.00)
Calcium: 9 mg/dL (ref 8.9–10.3)
GFR calc non Af Amer: 27 mL/min — ABNORMAL LOW (ref >60)
GFR, EST AFRICAN AMERICAN: 31 mL/min — AB (ref >60)
GLUCOSE: 114 mg/dL — AB (ref 65–99)
Potassium: 3.3 mmol/L — ABNORMAL LOW (ref 3.5–5.1)
Sodium: 143 mmol/L (ref 135–145)

## 2015-02-23 LAB — CBC
HEMATOCRIT: 25.7 % — AB (ref 36.0–46.0)
HEMOGLOBIN: 8.1 g/dL — AB (ref 12.0–15.0)
MCH: 30.9 pg (ref 26.0–34.0)
MCHC: 31.5 g/dL (ref 30.0–36.0)
MCV: 98.1 fL (ref 78.0–100.0)
Platelets: 39 10*3/uL — ABNORMAL LOW (ref 150–400)
RBC: 2.62 MIL/uL — ABNORMAL LOW (ref 3.87–5.11)
RDW: 16.2 % — ABNORMAL HIGH (ref 11.5–15.5)
WBC: 3.1 10*3/uL — ABNORMAL LOW (ref 4.0–10.5)

## 2015-02-23 LAB — URINE CULTURE: Culture: NO GROWTH

## 2015-02-23 MED ORDER — ENSURE ENLIVE PO LIQD
237.0000 mL | Freq: Two times a day (BID) | ORAL | Status: DC
  Administered 2015-02-23 – 2015-02-24 (×2): 237 mL via ORAL

## 2015-02-23 MED ORDER — POTASSIUM CHLORIDE CRYS ER 20 MEQ PO TBCR
40.0000 meq | EXTENDED_RELEASE_TABLET | Freq: Once | ORAL | Status: AC
  Administered 2015-02-23: 40 meq via ORAL
  Filled 2015-02-23: qty 2

## 2015-02-23 NOTE — Progress Notes (Signed)
[facility]                                                                              Patient Demographics  [name] [name], is a 81 y.o. female, DOB - 1924-05-16, OVA:[id number]  Admit date - 02/21/2015   Admitting Physician Norval Morton, MD  Outpatient Primary MD for the patient is REED, [name], DO  LOS - 1   Chief Complaint  Patient presents with  . Altered Mental Status       Brief HPI   per Dr. Tamala Julian on 02/21/15 [name] [name] is a 81 y.o. female with non-ischemic cardiomyopathy. She has a cardiac defibrillator in place. Patient was brought to ED from the [place] this am for mental status changes still be called patient's daughter around 6:40 AM, told her patient was not responding well. Daughter does not know when patient was last seen normal, presumably at bedtime. The facility has been trying to get a urinalysis from patient over the last several days but still stool had contaminated the sample. Patient has a history of resistant UTIs. Several days ago the patient complained of suprapubic pain   ED COURSE: Temperature 100.8, heart rate 105, respirations 17, blood pressure 128/73. Patient is pancytopenic . She's had progressive thrombocytopenia. Platelets in November 2015 were 128, down to 55 on the day of admission.. Hemoglobin 10, this seems to be around her baseline. Patient has chronic kidney disease with baseline creatinine around 1.5 . Today BUN 62, creatinine 2.83. Chest x-ray without acute abnormalities . Urinalysis with granular cast, 21-50 WBCs.     Assessment & Plan    Principal Problem:   Sepsis secondary to UTI (lower urinary tract infection), SIRS: Patient met SIRS criteria at the time of admission with tachycardia, fever, source of infection being UTI - Continue IV Rocephin, dc'ed vancomycin - Lactic acid 1.2, pro calcitonin less than 0.1 - Chest x-ray with no edema or consolidation - Follow urine culture and  sensitivity, blood cultures negative so far  Active Problems:   Acute on Chronic kidney disease (CKD), stage III (moderate) improving - Likely due to #1, on diuretics at home - Hold Lasix, continue gentle hydration, creatinine improving    Hypothyroidism - TSH 0.4  continue Synthroid    Dehydration -Continue gentle hydration     Encephalopathy acute Superimposed on dementia - Likely worsened due to UTI, hyponatremia, continue Seroquel    Hypernatremia - DC IV normal saline, placed on D5W    Chronic Pancytopenia (HCC) thrombocytopenia - Baseline in 80's. Likely worsened due to sepsis, UTI - Continue to monitor CBC closely, platelets are dropping likely due to infection/sepsis  Hypokalemia Replaced   Code Status: DO NOT RESUSCITATE   Family Communication: Discussed in detail with the patient, all imaging results, lab results explained to the patient. Follow OT evaluation, may need home health versus skilled nursing facility     Disposition Plan:  Hopefully DC home in a.m.  Time Spent in minutes 25 minutes  Procedures  Chest x-ray  Consults   None   DVT Prophylaxis  SCDs  Medications  Scheduled Meds: . allopurinol  100 mg  Oral Daily  . aspirin EC  81 mg Oral Daily  . carvedilol  6.25 mg Oral BID WC  . cefTRIAXone (ROCEPHIN)  IV  1 g Intravenous Q24H  . docusate sodium  100 mg Oral BID  . fentaNYL  25 mcg Transdermal Q72H  . isosorbide mononitrate  30 mg Oral Daily  . levothyroxine  100 mcg Oral QAC breakfast  . memantine  28 mg Oral Daily  . pantoprazole  40 mg Oral BID AC  . polyvinyl alcohol  1 drop Both Eyes TID  . QUEtiapine  50 mg Oral QHS  . sertraline  100 mg Oral Daily  . sodium chloride  3 mL Intravenous Q12H   Continuous Infusions: . dextrose 1,000 mL (02/23/15 0639)   PRN Meds:.sodium chloride, acetaminophen **OR** acetaminophen, HYDROcodone-acetaminophen, ondansetron **OR** ondansetron (ZOFRAN) IV, polyethylene glycol, sodium  chloride   Antibiotics   Anti-infectives    Start     Dose/Rate Route Frequency Ordered Stop   02/23/15 1100  vancomycin (VANCOCIN) IVPB 750 mg/150 ml premix  Status:  Discontinued     750 mg 150 mL/hr over 60 Minutes Intravenous Every 48 hours 02/21/15 1159 02/22/15 0945   02/21/15 1015  vancomycin (VANCOCIN) IVPB 1000 mg/200 mL premix     1,000 mg 200 mL/hr over 60 Minutes Intravenous  Once 02/21/15 1007 02/21/15 1145   02/21/15 1000  cefTRIAXone (ROCEPHIN) 1 g in dextrose 5 % 50 mL IVPB     1 g 100 mL/hr over 30 Minutes Intravenous Every 24 hours 02/21/15 0959          Subjective:   [name] [name] was seen and examined today. Feeling somewhat better today, denies any pain no fevers overnight . Denies any specific complaidenies any chest pain, shortness of breath, abdominal pain, nausea or vomiting, any focal weakness.  Objective  Blood pressure 136/67, pulse 83, temperature 98.1 F (36.7 C), temperature source Oral, resp. rate 17, height 5\' 2"  (1.575 m), weight 59.5 kg (131 lb 2.8 oz), SpO2 98 %.  Wt Readings from Last 3 Encounters:  02/21/15 59.5 kg (131 lb 2.8 oz)  10/24/14 57.425 kg (126 lb 9.6 oz)  06/20/14 56.065 kg (123 lb 9.6 oz)     Intake/Output Summary (Last 24 hours) at 02/23/15 0955 Last data filed at 02/22/15 2134  Gross per 24 hour  Intake   1015 ml  Output      0 ml  Net   1015 ml    Exam  General: Alert and oriented x 2 NAD  HEENT:  PERRLA, EOMI, Anicteric Sclera, mucous membranes moist.   Neck: Supple, no JVD, no masses  CVS: S1 S2 clear, RRR  Respiratory: CTAB  Abdomen: Soft, nontender, nondistended, + bowel sounds  Ext: no cyanosis clubbing or edema  Neuro: no new deficits   Skin: No rashes  Psych: Normal affect and demeanor, alert and oriented x2    Data Review   Micro Results Recent Results (from the past 240 hour(s))  Culture, Urine     Status: None   Collection Time: 02/21/15  7:54 AM  Result Value Ref Range  Status   Specimen Description URINE, CATHETERIZED  Final   Special Requests NONE  Final   Culture NO GROWTH 1 DAY  Final   Report Status 02/23/2015 FINAL  Final  MRSA PCR Screening     Status: None   Collection Time: 02/21/15  3:40 PM  Result Value Ref Range Status   MRSA by PCR NEGATIVE NEGATIVE Final  Comment:        The GeneXpert MRSA Assay (FDA approved for NASAL specimens only), is one component of a comprehensive MRSA colonization surveillance program. It is not intended to diagnose MRSA infection nor to guide or monitor treatment for MRSA infections.   Culture, blood (x 2)     Status: None (Preliminary result)   Collection Time: 02/21/15  4:40 PM  Result Value Ref Range Status   Specimen Description BLOOD LEFT ANTECUBITAL  Final   Special Requests BOTTLES DRAWN AEROBIC ONLY 10CC  Final   Culture NO GROWTH < 24 HOURS  Final   Report Status PENDING  Incomplete  Culture, blood (x 2)     Status: None (Preliminary result)   Collection Time: 02/21/15  4:54 PM  Result Value Ref Range Status   Specimen Description BLOOD RIGHT ANTECUBITAL  Final   Special Requests BOTTLES DRAWN AEROBIC ONLY 10CC  Final   Culture NO GROWTH < 24 HOURS  Final   Report Status PENDING  Incomplete    Radiology Reports Dg Chest Port 1 View  02/21/2015  CLINICAL DATA:  Altered mental status EXAM: PORTABLE CHEST 1 VIEW COMPARISON:  January 10, 2015 FINDINGS: There is no edema or consolidation. Heart size and pulmonary vascularity are within normal limits. Pacemaker leads are attached to the right atrium right ventricle, stable. There is atherosclerotic change in aorta. No adenopathy. No bone lesions. IMPRESSION: No edema or consolidation.  No change in cardiac silhouette. Electronically Signed   By: Lowella Grip III M.D.   On: 02/21/2015 10:08    CBC  Recent Labs Lab 02/21/15 0807 02/22/15 0505 02/23/15 0535  WBC 3.1* 3.8* 3.1*  HGB 10.0* 8.3* 8.1*  HCT 32.6* 26.7* 25.7*  PLT 55* 41*  39*  MCV 100.0 101.1* 98.1  MCH 30.7 31.4 30.9  MCHC 30.7 31.1 31.5  RDW 16.5* 16.6* 16.2*    Chemistries   Recent Labs Lab 02/21/15 0807 02/22/15 0505 02/23/15 0535  NA 145 151* 143  K 4.4 3.7 3.3*  CL 109 117* 107  CO2 $Re'24 23 25  'LhF$ GLUCOSE 143* 105* 114*  BUN 62* 63* 46*  CREATININE 2.83* 2.09* 1.64*  CALCIUM 9.7 9.2 9.0  AST 25 24  --   ALT 12* 10*  --   ALKPHOS 68 46  --   BILITOT 1.0 0.7  --    ------------------------------------------------------------------------------------------------------------------ estimated creatinine clearance is 18.8 mL/min (by C-G formula based on Cr of 1.64). ------------------------------------------------------------------------------------------------------------------ No results for input(s): HGBA1C in the last 72 hours. ------------------------------------------------------------------------------------------------------------------ No results for input(s): CHOL, HDL, LDLCALC, TRIG, CHOLHDL, LDLDIRECT in the last 72 hours. ------------------------------------------------------------------------------------------------------------------  Recent Labs  02/21/15 1650  TSH 0.442   ------------------------------------------------------------------------------------------------------------------ No results for input(s): VITAMINB12, FOLATE, FERRITIN, TIBC, IRON, RETICCTPCT in the last 72 hours.  Coagulation profile  Recent Labs Lab 02/21/15 1654  INR 1.21    No results for input(s): DDIMER in the last 72 hours.  Cardiac Enzymes No results for input(s): CKMB, TROPONINI, MYOGLOBIN in the last 168 hours.  Invalid input(s): CK ------------------------------------------------------------------------------------------------------------------ Invalid input(s): POCBNP   Recent Labs  02/21/15 0816  GLUCAP 134*     Sadiq Mccauley M.D. [facility] 02/23/2015, 9:55 AM  Pager: [phone] Between 7am to 7pm - call Pager -  [phone]  After 7pm go to [email or web address] - password [email or web address]  Call night coverage person covering after 7pm

## 2015-02-24 DIAGNOSIS — G9340 Encephalopathy, unspecified: Secondary | ICD-10-CM

## 2015-02-24 DIAGNOSIS — R6510 Systemic inflammatory response syndrome (SIRS) of non-infectious origin without acute organ dysfunction: Secondary | ICD-10-CM

## 2015-02-24 DIAGNOSIS — A419 Sepsis, unspecified organism: Principal | ICD-10-CM

## 2015-02-24 DIAGNOSIS — E039 Hypothyroidism, unspecified: Secondary | ICD-10-CM

## 2015-02-24 DIAGNOSIS — E870 Hyperosmolality and hypernatremia: Secondary | ICD-10-CM

## 2015-02-24 DIAGNOSIS — D61818 Other pancytopenia: Secondary | ICD-10-CM

## 2015-02-24 LAB — CBC
HEMATOCRIT: 24.2 % — AB (ref 36.0–46.0)
HEMATOCRIT: 27.6 % — AB (ref 36.0–46.0)
HEMOGLOBIN: 7.8 g/dL — AB (ref 12.0–15.0)
Hemoglobin: 9 g/dL — ABNORMAL LOW (ref 12.0–15.0)
MCH: 31 pg (ref 26.0–34.0)
MCH: 30.3 pg (ref 26.0–34.0)
MCHC: 32.2 g/dL (ref 30.0–36.0)
MCHC: 32.6 g/dL (ref 30.0–36.0)
MCV: 96 fL (ref 78.0–100.0)
MCV: 92.9 fL (ref 78.0–100.0)
Platelets: 38 10*3/uL — ABNORMAL LOW (ref 150–400)
Platelets: 37 10*3/uL — ABNORMAL LOW (ref 150–400)
RBC: 2.52 MIL/uL — AB (ref 3.87–5.11)
RBC: 2.97 MIL/uL — ABNORMAL LOW (ref 3.87–5.11)
RDW: 15.8 % — ABNORMAL HIGH (ref 11.5–15.5)
RDW: 17.3 % — AB (ref 11.5–15.5)
WBC: 2.7 10*3/uL — ABNORMAL LOW (ref 4.0–10.5)
WBC: 3.4 10*3/uL — ABNORMAL LOW (ref 4.0–10.5)

## 2015-02-24 LAB — BASIC METABOLIC PANEL
Anion gap: 10 (ref 5–15)
BUN: 31 mg/dL — ABNORMAL HIGH (ref 6–20)
CHLORIDE: 110 mmol/L (ref 101–111)
CO2: 23 mmol/L (ref 22–32)
CREATININE: 1.41 mg/dL — AB (ref 0.44–1.00)
Calcium: 9 mg/dL (ref 8.9–10.3)
GFR calc non Af Amer: 32 mL/min — ABNORMAL LOW (ref >60)
GFR, EST AFRICAN AMERICAN: 37 mL/min — AB (ref >60)
Glucose, Bld: 112 mg/dL — ABNORMAL HIGH (ref 65–99)
POTASSIUM: 3.9 mmol/L (ref 3.5–5.1)
SODIUM: 143 mmol/L (ref 135–145)

## 2015-02-24 LAB — RETICULOCYTES
RBC.: 2.65 MIL/uL — AB (ref 3.87–5.11)
RETIC COUNT ABSOLUTE: 39.8 10*3/uL (ref 19.0–186.0)
RETIC CT PCT: 1.5 % (ref 0.4–3.1)

## 2015-02-24 LAB — FOLATE: FOLATE: 13.4 ng/mL (ref >5.9)

## 2015-02-24 LAB — VITAMIN B12: VITAMIN B 12: 408 pg/mL (ref 180–914)

## 2015-02-24 LAB — IRON AND TIBC
IRON: 69 ug/dL (ref 28–170)
Saturation Ratios: 30 % (ref 10.4–31.8)
TIBC: 232 ug/dL — ABNORMAL LOW (ref 250–450)
UIBC: 163 ug/dL

## 2015-02-24 LAB — PREPARE RBC (CROSSMATCH)

## 2015-02-24 LAB — FERRITIN: Ferritin: 330 ng/mL — ABNORMAL HIGH (ref 11–307)

## 2015-02-24 LAB — ABO/RH: ABO/RH(D): O NEG

## 2015-02-24 LAB — LACTATE DEHYDROGENASE: LDH: 214 U/L — ABNORMAL HIGH (ref 98–192)

## 2015-02-24 MED ORDER — ENSURE ENLIVE PO LIQD: 237.0000 mL | Freq: Two times a day (BID) | ORAL | Status: AC

## 2015-02-24 MED ORDER — CEPHALEXIN 500 MG PO CAPS: 500.0000 mg | ORAL_CAPSULE | Freq: Two times a day (BID) | ORAL | Status: DC

## 2015-02-24 MED ORDER — FUROSEMIDE 40 MG PO TABS: 40.0000 mg | ORAL_TABLET | Freq: Every day | ORAL | Status: DC

## 2015-02-24 MED ORDER — DOCUSATE SODIUM 100 MG PO CAPS
100.0000 mg | ORAL_CAPSULE | Freq: Two times a day (BID) | ORAL | Status: DC
Start: 2015-02-24 — End: 2015-03-13

## 2015-02-24 MED ORDER — FERROUS GLUCONATE 324 (38 FE) MG PO TABS: 324.0000 mg | ORAL_TABLET | Freq: Every day | ORAL | Status: DC

## 2015-02-24 MED ORDER — QUETIAPINE FUMARATE 50 MG PO TABS: 50.0000 mg | ORAL_TABLET | Freq: Every day | ORAL | Status: DC

## 2015-02-24 MED ORDER — HYDROCODONE-ACETAMINOPHEN 5-325 MG PO TABS: 1.0000 | ORAL_TABLET | Freq: Four times a day (QID) | ORAL | Status: DC | PRN

## 2015-02-24 MED ORDER — SODIUM CHLORIDE 0.9 % IV SOLN
Freq: Once | INTRAVENOUS | Status: AC
  Administered 2015-02-24: 08:00:00 via INTRAVENOUS

## 2015-02-24 MED ORDER — FENTANYL 25 MCG/HR TD PT72: MEDICATED_PATCH | TRANSDERMAL | Status: DC

## 2015-02-24 NOTE — Progress Notes (Signed)
NURSING PROGRESS NOTE  [name] [name] [id number] Discharge Data: 02/24/2015 6:58 PM Attending Provider: Mendel Corning, MD OEH:[id number], [name], DO   Percell [place] Wexler to be D/C'd [place] per MD order via EMS. All EMS paperwork including hard copies of all prescriptions given to EMS staff in yellow packet prepared by Education officer, museum.    All IV's will be discontinued and monitored for bleeding.  All belongings will be returned to patient for patient to take home.  Last Documented Vital Signs:  Blood pressure 129/54, pulse 86, temperature 98.3 F (36.8 C), temperature source Oral, resp. rate 18, height 5\' 2"  (1.575 m), weight 59.5 kg (131 lb 2.8 oz), SpO2 98 %.  Hendricks Limes RN, BS, BSN

## 2015-02-24 NOTE — Care Management Note (Signed)
Case Management Note  Patient Details  Name: [name] [name] MRN: [id number] Date of Birth: [birth year]  Subjective/Objective:                 Patient from [place]. Plan to DC today after blood transfusion. Spoke to [place] at [place] she states they Soil scientist through [place]. Referral made to [facility] for [facility] PT and RN.   Action/Plan:   Expected Discharge Date:                  Expected Discharge Plan:  Assisted Living / Rest Home  In-House Referral:  Clinical Social Work  Discharge planning Services  CM Consult  Post Acute Care Choice:    Choice offered to:  NA (Per ALF contract)  DME Arranged:    DME Agency:     HH Arranged:  RN, PT [place] Agency:  [place]  Status of Service:  Completed, signed off  Medicare Important Message Given:    Date Medicare IM Given:    Medicare IM give by:    Date Additional Medicare IM Given:    Additional Medicare Important Message give by:     If discussed at [place] of Stay Meetings, dates discussed:    Additional Comments:  Carles Collet, RN 02/24/2015, 10:39 AM

## 2015-02-24 NOTE — Progress Notes (Signed)
Per MD Rai whom spoke with LCSW Lorriane Shire, Lorriane Shire is in possession of the signed FL2 and DNR, and Lorriane Shire is working on patient discharge to facility.

## 2015-02-24 NOTE — Discharge Summary (Addendum)
Physician Discharge Summary   Patient ID: [name] [name] MRN: [id number] DOB/AGE: 1924/12/02 81 y.o.  Admit date: 02/21/2015 Discharge date: 02/24/2015  Primary Care Physician:  Hollace Kinnier, DO  Discharge Diagnoses:     . UTI (lower urinary tract infection) . acute kidney injury on Chronic kidney disease (CKD), stage III (moderate) . Pancytopenia ([place]) with thrombocytopenia . Dehydration . Hypothyroidism . Sepsis secondary to UTI ([place]) . Encephalopathy acute . Dementia . SIRS (systemic inflammatory response syndrome) (HCC) . Hypernatremia  Consults:  Hematology-Onc, Dr. Jana Hakim via phone consultation   Recommendations for Outpatient Follow-up:  Please follow CBC, patient's platelet counts have been dropping down, she has chronic thrombocytopenia. I discussed with hematology, Dr. Jana Hakim, patient has appointment on 03/18/15 at 11:00am with Dr Irene Limbo at [place].   Patient needs to have home health PT, RN to follow. Patient needs physical therapy for strengthening and gait training.  TESTS THAT NEED FOLLOW-UP CBC, BMET   DIET: Heart healthy diet    Allergies:   Allergies  Allergen Reactions  . Ace Inhibitors Cough  . Requip [Ropinirole Hcl] Other (See Comments)    confusion     Discharge Medications:   Medication List    STOP taking these medications        Cholecalciferol 50000 UNITS capsule  Commonly known as:  D3-50     D3-50 50000 UNITS capsule  Generic drug:  Cholecalciferol     UTI-STAT Liqd      TAKE these medications        allopurinol 100 MG tablet  Commonly known as:  ZYLOPRIM  Take 1 tablet by mouth daily for Gout     AMBULATORY NON FORMULARY MEDICATION  Wheelchair and Cushion. Patient is unable to propel herself-she is nonambulatory     aspirin EC 81 MG tablet  Take 1 tablet (81 mg total) by mouth daily.     Calcium Carbonate-Vitamin D 600-400 MG-UNIT tablet  Commonly known as:  CALTRATE 600+D  Take one tablet  by mouth four times daily with meals     carvedilol 12.5 MG tablet  Commonly known as:  COREG  Take 1/2 tablet by mouth twice daily     cephALEXin 500 MG capsule  Commonly known as:  KEFLEX  Take 1 capsule (500 mg total) by mouth 2 (two) times daily. X 4 more days     docusate sodium 100 MG capsule  Commonly known as:  COLACE  Take 1 capsule (100 mg total) by mouth 2 (two) times daily.     feeding supplement (ENSURE ENLIVE) Liqd  Take 237 mLs by mouth 2 (two) times daily between meals.     fentaNYL 25 MCG/HR patch  Commonly known as:  DURAGESIC - dosed mcg/hr  Place 1 patch onto the skin every 3 days for pain     ferrous gluconate 324 MG tablet  Commonly known as:  FERGON  Take 1 tablet (324 mg total) by mouth daily with breakfast.     furosemide 40 MG tablet  Commonly known as:  LASIX  Take 1 tablet (40 mg total) by mouth daily.  Start taking on:  02/27/2015     hydrALAZINE 10 MG tablet  Commonly known as:  APRESOLINE  Take 1 tablet by mouth 3 times daily, breakfast, 2:00 pm and bedtime for BP     HYDROcodone-acetaminophen 5-325 MG tablet  Commonly known as:  NORCO/VICODIN  Take 1 tablet by mouth every 6 (six) hours as needed for moderate pain. Take one  tablet by mouth every 6 hours as needed for leg pain     isosorbide mononitrate 30 MG 24 hr tablet  Commonly known as:  IMDUR  Take 1 tablet by mouth daily for BP     KLOR-CON M20 20 MEQ tablet  Generic drug:  potassium chloride SA  TAKE 2 TABLETS BY MOUTH TWICE A DAY FOR HYPOKALEMIA     levothyroxine 100 MCG tablet  Commonly known as:  SYNTHROID, LEVOTHROID  Take 1 tablet (100 mcg total) by mouth daily.     memantine 28 MG Cp24 24 hr capsule  Commonly known as:  NAMENDA XR  Take one tablet by mouth once daily for memory     pantoprazole 40 MG tablet  Commonly known as:  PROTONIX  Take 1 tablet by mouth 2 times daily before a meal for GERD     polyethylene glycol packet  Commonly known as:  MIRALAX /  GLYCOLAX  Take 17 g by mouth daily. For constipation. Mix with Cranberry juice in the morning     polyvinyl alcohol 1.4 % ophthalmic solution  Commonly known as:  LIQUIFILM TEARS  Place 1 drop into both eyes 3 (three) times daily.     QUEtiapine 50 MG tablet  Commonly known as:  SEROQUEL  Take 1 tablet (50 mg total) by mouth at bedtime.     risperiDONE 0.5 MG tablet  Commonly known as:  RISPERDAL  TAKE ONE TABLET BY MOUTH EVERY 6 HOURS AS NEEDED FOR AGITATION.     senna-docusate 8.6-50 MG tablet  Commonly known as:  Senokot-S  Take 2 tablets by mouth daily. Hold for loose stools     sertraline 100 MG tablet  Commonly known as:  ZOLOFT  Take 1 tablet by mouth daily for Depression     Vitamin D (Ergocalciferol) 50000 UNITS Caps capsule  Commonly known as:  DRISDOL  Take 50,000 Units by mouth every 7 (seven) days. Wednesday         Brief H and P: For complete details please refer to admission H and P, but in brief per Dr. Tamala Julian on 02/21/15 [name] [name] is a 81 y.o. female with non-ischemic cardiomyopathy. She has a cardiac defibrillator in place. Patient was brought to ED from the [facility] this am for mental status changes still be called patient's daughter around 6:40 AM, told her patient was not responding well. Daughter does not know when patient was last seen normal, presumably at bedtime. The facility has been trying to get a urinalysis from patient over the last several days but still stool had contaminated the sample. Patient has a history of resistant UTIs. Several days ago the patient complained of suprapubic pain  ED COURSE: Temperature 100.8, heart rate 105, respirations 17, blood pressure 128/73. Patient is pancytopenic . She's had progressive thrombocytopenia. Platelets in November 2015 were 128, down to 55 on the day of admission.. Hemoglobin 10, this seems to be around her baseline. Patient has chronic kidney disease with baseline creatinine around 1.5  . Today BUN 62, creatinine 2.83. Chest x-ray without acute abnormalities . Urinalysis with granular cast, 21-50 WBCs.   Hospital Course:  Sepsis secondary to UTI (lower urinary tract infection), SIRS: Patient met SIRS criteria at the time of admission with tachycardia, fever, source of infection being UTI Patient was placed on IV Rocephin. Initially placed on broad-spectrum antibiotics with vancomycin which was discontinued after 24 hours. Blood cultures remain negative to date. - Lactic acid 1.2, pro calcitonin less than  0.1 - Chest x-ray with no edema or consolidation - Urine culture however showed no growth. Patient was discharged on oral Keflex for 4 days to complete a full course of antibiotics.   Acute on Chronic kidney disease (CKD), stage III (moderate) improving, baseline creatinine 1.4. Patient presented with creatinine of 2.83 at the time of admission. - Patient was on Lasix twice a day prior to admission, Lasix was held. Patient was gently hydrated. Creatinine has improved to baseline now, she can restart Lasix, 40 mg daily on 02/27/15. Prior to admission, patient was on Lasix 80 mg daily. Recheck BMET at the time of follow-up appointment   Hypothyroidism - TSH 0.4 continue Synthroid   Dehydration -Resolved with hydration   Encephalopathy acute Superimposed on dementia resolved - Likely worsened due to UTI, hyponatremia, continue Seroquel   Hypernatremia -Resolved, sodium had trended up to 151. Improved to 143 at the time of discharge.   Chronic Pancytopenia (HCC) thrombocytopenia - Baseline in 80's. Likely worsened due to sepsis, UTI, platelets trended down during hospitalization -  Anemia panel showed iron 69, ferritin 330, LDH 214. Stool occult test is pending. Patient had no overt GI bleeding. Discussed in detail with Dr. Jana Hakim, hematology oncology as patient has chronic pancytopenia with thrombocytopenia does not appear to have worked up in the past. Per Dr  Jana Hakim, okay to be released back to ALF today, patient has appointment with Dr Irene Limbo on 03/18/15 at 11:00am at [facility].   Patient received 1 unit pRBC's, hemoglobin 9.0 at discharge.   Hypokalemia Replaced  Day of Discharge BP 129/54 mmHg  Pulse 86  Temp(Src) 98.3 F (36.8 C) (Oral)  Resp 18  Ht $R'5\' 2"'Qw$  (1.575 m)  Wt 59.5 kg (131 lb 2.8 oz)  BMI 23.99 kg/m2  SpO2 98%  Physical Exam: General: Alert and awake oriented x3 not in any acute distress. HEENT: anicteric sclera, pupils reactive to light and accommodation CVS: S1-S2 clear no murmur rubs or gallops Chest: clear to auscultation bilaterally, no wheezing rales or rhonchi Abdomen: soft nontender, nondistended, normal bowel sounds Extremities: no cyanosis, clubbing or edema noted bilaterally Neuro: Cranial nerves II-XII intact, no focal neurological deficits   The results of significant diagnostics from this hospitalization (including imaging, microbiology, ancillary and laboratory) are listed below for reference.    LAB RESULTS: Basic Metabolic Panel:  Recent Labs Lab 02/23/15 0535 02/24/15 0539  NA 143 143  K 3.3* 3.9  CL 107 110  CO2 25 23  GLUCOSE 114* 112*  BUN 46* 31*  CREATININE 1.64* 1.41*  CALCIUM 9.0 9.0   Liver Function Tests:  Recent Labs Lab 02/21/15 0807 02/22/15 0505  AST 25 24  ALT 12* 10*  ALKPHOS 68 46  BILITOT 1.0 0.7  PROT 7.7 6.0*  ALBUMIN 3.8 3.2*   No results for input(s): LIPASE, AMYLASE in the last 168 hours. No results for input(s): AMMONIA in the last 168 hours. CBC:  Recent Labs Lab 02/24/15 0539 02/24/15 1549  WBC 2.7* 3.4*  HGB 7.8* 9.0*  HCT 24.2* 27.6*  MCV 96.0 92.9  PLT 38* 37*   Cardiac Enzymes: No results for input(s): CKTOTAL, CKMB, CKMBINDEX, TROPONINI in the last 168 hours. BNP: Invalid input(s): POCBNP CBG:  Recent Labs Lab 02/21/15 0816  GLUCAP 134*    Significant Diagnostic Studies:  Dg Chest Port 1 View  02/21/2015   CLINICAL DATA:  Altered mental status EXAM: PORTABLE CHEST 1 VIEW COMPARISON:  January 10, 2015 FINDINGS: There is no edema  or consolidation. Heart size and pulmonary vascularity are within normal limits. Pacemaker leads are attached to the right atrium right ventricle, stable. There is atherosclerotic change in aorta. No adenopathy. No bone lesions. IMPRESSION: No edema or consolidation.  No change in cardiac silhouette. Electronically Signed   By: Lowella Grip III M.D.   On: 02/21/2015 10:08    2D ECHO:   Disposition and Follow-up: Discharge Instructions    Diet - low sodium heart healthy    Complete by:  As directed      Increase activity slowly    Complete by:  As directed             DISPOSITION: Assisted living facility  DISCHARGE FOLLOW-UP Follow-up Information    Follow up with REED, [name], DO. Schedule an appointment as soon as possible for a visit in 10 days.   Specialty:  Geriatric Medicine   Why:  for hospital follow-up, obtain labs BMET, CBC    Contact information:   [address] [place] [address] [phone]       Follow up with Sullivan Lone, MD On 03/18/2015.   Specialties:  Hematology, Oncology   Why:  at 11:00 AM   Contact information:   [place] [address] [phone]       Follow up with [facility].   Why:  For PT and RN. Will call 24 to 48 hours after discharge to set up first home appointment.   Contact information:   [address] [place] [address] [phone]        Time spent on Discharge: 35 minutes  Signed:   Kaulana Brindle M.D. [facility] 02/24/2015, 4:45 PM Pager: [phone]

## 2015-02-24 NOTE — Progress Notes (Signed)
LCSW Lorriane Shire called x2, left voicemail, MD Rai needs FL2 to sign for patient discharge.

## 2015-02-24 NOTE — Evaluation (Signed)
Occupational Therapy Evaluation Patient Details Name: [name] [name] MRN: [id number] DOB: [birth year] Today's Date: 02/24/2015    History of Present Illness 81 yo female with onset of septic shock from UTI was referred to PT,  PMHx:  CVA, CHF.   Clinical Impression   Per pt's daughter, pt required extensive assist with self care tasks at ALF including all of bathing, dressing, and toileting. Staff assisted pt to wheelchair and transported her to meals and into the bathroom in wheelchair. Pt could feed herself after set up and assist with grooming. As long as staff can continue with the 24/7 physical assist, feel pt can return to ALF; otherwise will need SNF. Recommend HHOT at ALF for strengthening to help pt better participate in ADL and decrease caregiver burden.    Follow Up Recommendations  Supervision/Assistance - 24 hour;Other (comment);Home health OT ([place] at ALF. If ALF not able to manage, recommend SNF)    Equipment Recommendations  None recommended by OT    Recommendations for Other Services       Precautions / Restrictions Precautions Precautions: Fall Restrictions Weight Bearing Restrictions: No      Mobility Bed Mobility Overal bed mobility: Needs Assistance Bed Mobility: Supine to Sit;Sit to Supine     Supine to sit: Max assist Sit to supine: Max assist   General bed mobility comments: pt able to help with moving LEs over to EOB but with significant assist to bring trunk to upright. Tending to lean to the L. use of pad to scoot hips around to EOB. For sit to supine, pt able to help bring LEs onto bed but still needed some support and assist with trunk to guide down to bed.   Transfers                 General transfer comment: did not attempt. pt fatigued from sitting. Pt receiving 1 unit of blood currently.    Balance     Sitting balance-Leahy Scale: Poor                                      ADL Overall ADL's : Needs  assistance/impaired     Grooming: Wash/dry face;Moderate assistance;Sitting                                 General ADL Comments: Pt's daughter present for session and provided history. Pt has not been able to bathe or dress herself in a long time. She was able to feed herself and help with grooming. She sat in her wheelchair during the day and peddled around her room but needed staff assist to push wheelchair to dining room. Pt with poor sitting balance sitting EOB today and required mod assist at times to correct--tending to lean to the L with fatigue. Pt did wash her face at EOB. Daughter states she does get fatigued at ALF when up in wheelchair and was more comfortable in bed than her wheelchair.      Vision     Perception     Praxis      Pertinent Vitals/Pain Pain Assessment: No/denies pain     Hand Dominance Right   Extremity/Trunk Assessment Upper Extremity Assessment Upper Extremity Assessment: Generalized weakness           Communication Communication Communication: No difficulties   Cognition Arousal/Alertness: Awake/alert Behavior  During Therapy: Anxious Overall Cognitive Status: History of cognitive impairments - at baseline                     General Comments       Exercises       Shoulder Instructions      Home Living Family/patient expects to be discharged to:: Assisted living Living Arrangements: Other (Comment) (ALF) Available Help at Discharge: Available 24 hours/day Type of Home: Assisted living Home Access: Level entry     Home Layout: One level         Bathroom Toilet: Handicapped height Bathroom Accessibility: Yes   Home Equipment: Grab bars - toilet (Pt unable to explain)          Prior Functioning/Environment Level of Independence: Needs assistance  Gait / Transfers Assistance Needed: per daughter, pt had assistance with transfers to wheelchair. Has not ambulated in a long time. Sometimes +2 assist to  get into wheelchair with daughter helping CNA. ADL's / Homemaking Assistance Needed: assisted living for cooking and housekeeping. pt able to feed self after set up. She could assist with grooming. Total assist with bathing, dressing, toileting task. pt would hold to grab bar while CNAs assisted with clothing management, etc at commode.         OT Diagnosis: Generalized weakness   OT Problem List: Decreased strength;Decreased knowledge of use of DME or AE;Decreased activity tolerance;Impaired balance (sitting and/or standing)   OT Treatment/Interventions: Self-care/ADL training;Patient/family education;Therapeutic activities;DME and/or AE instruction    OT Goals(Current goals can be found in the care plan section) Acute Rehab OT Goals Patient Stated Goal: none stated. daughter agreeable to help pt get more strength back.  OT Goal Formulation: With patient/family Time For Goal Achievement: 03/10/15 Potential to Achieve Goals: Good  OT Frequency: Min 2X/week   Barriers to D/C:            Co-evaluation              End of Session    Activity Tolerance: Patient limited by fatigue Patient left: in bed;with call bell/phone within reach;with bed alarm set;with family/visitor present   Time: 7412-8786 OT Time Calculation (min): 25 min Charges:  OT General Charges $OT Visit: 1 Procedure OT Evaluation $Initial OT Evaluation Tier I: 1 Procedure OT Treatments $Therapeutic Activity: 8-22 mins G-Codes:    Jules Schick  [phone] 02/24/2015, 1:11 PM

## 2015-02-24 NOTE — Clinical Social Work Note (Signed)
Clinical Social Work Assessment  Patient Details  Name: [name] [name] MRN: [id number] Date of Birth: 05/25/1924  Date of referral:  02/24/15               Reason for consult:  Facility Placement                Permission sought to share information with:  Family Supports Permission granted to share information::  Yes, Verbal Permission Granted (Daughter was in the room with patient)  Name::     [name] [name]  Agency::     Relationship::  Daughter  Contact Information:  [phone]  Housing/Transportation Living arrangements for the past 2 months:  [place] of Information:  Adult Children Patient Interpreter Needed:  None Criminal Activity/Legal Involvement Pertinent to Current Situation/Hospitalization:  No - Comment as needed Significant Relationships:  Adult Children Lives with:    Do you feel safe going back to the place where you live?  Yes Need for family participation in patient care:  Yes (Comment)  Care giving concerns:  Ms. [name] [name] (daughter) wanted to assure that ALF could meet her mother's needs post discharge. CSW informed daughter that facility would get clinicals so that they could determine if they can continue to meet her needs or if ST rehab was needed.e (ad    Facilities manager / plan:  CSW talked with daughter at the bedside regarding discharge plans for patient. Ms. [name] [name] was awake, made eye contact with CSW from time to time, and was listening but did not participate in the conversation. Patient's daughter, Ms. [name] [name] wanted patient to return to [facility] and was concerned about staff at ALF being able to meet her mother's needs and this was discussed. Daughter informed later that clinicals sent to facility and they would be able to accept patient back.  Employment status:  Retired Forensic scientist:    PT Recommendations:  Home with [place] Information / Referral to community  resources:  Other (Comment Required) (Patient returning to [place])  Patient/Family's Response to care:  No concerns expressed by daughter.  Patient/Family's Understanding of and Emotional Response to Diagnosis, Current Treatment, and Prognosis:  Not discussed.  Emotional Assessment Appearance:  Appears stated age Attitude/Demeanor/Rapport:  Other (Appropriate) Affect (typically observed):  Calm, Quiet Orientation:  Oriented to Self, Oriented to Place, Oriented to Situation Alcohol / Substance use:  Tobacco Use (Patient quit smoking approx. 11 years ago. Patient reported that she does not drink or use illicit drugs.) Psych involvement (Current and /or in the community):  No (Comment)  Discharge Needs  Concerns to be addressed:  Discharge Planning Concerns Readmission within the last 30 days:  No Current discharge risk:  None Barriers to Discharge:  No Barriers Identified   [name] Feil, LCSW 02/24/2015, 7:23 PM

## 2015-02-24 NOTE — Progress Notes (Signed)
Report called to [facility] at [facility] all questions answered to her satisfaction. Spoke with LCSW Lorriane Shire whom has completed the patient packet and called for EMS transport.

## 2015-02-24 NOTE — Clinical Social Work Note (Signed)
Patient discharged back to [name] today, transported by ambulance. Discharge information transmitted to facility.  Daughter Asencion Partridge was at the bedside.   Hussain Maimone Givens, MSW, LCSW Licensed Clinical Social Worker [place] [phone]

## 2015-02-24 NOTE — Progress Notes (Signed)
LCSW Lorriane Shire contacted to touch base about patient discharge, Lorriane Shire stated she will be contacting the patient's facility to set up discharge.

## 2015-02-24 NOTE — Progress Notes (Signed)
COURTESY NOTE:  Ms Deffenbaughhas an appointment with Dr Carolyne Fiscal (pronounced like "collie") NOV 8 at 11 AM. Her lab results should have been finalized by then.  Please let me know if I can be of further help

## 2015-02-25 ENCOUNTER — Telehealth: Payer: Self-pay | Admitting: Hematology

## 2015-02-25 LAB — TYPE AND SCREEN
ABO/RH(D): O NEG
ANTIBODY SCREEN: NEGATIVE
UNIT DIVISION: 0

## 2015-02-25 LAB — PROTEIN ELECTROPHORESIS, SERUM
A/G RATIO SPE: 1.1 (ref 0.7–1.7)
ALBUMIN ELP: 3 g/dL (ref 2.9–4.4)
ALPHA-1-GLOBULIN: 0.3 g/dL (ref 0.0–0.4)
Alpha-2-Globulin: 0.6 g/dL (ref 0.4–1.0)
Beta Globulin: 0.9 g/dL (ref 0.7–1.3)
GLOBULIN, TOTAL: 2.8 g/dL (ref 2.2–3.9)
Gamma Globulin: 1.1 g/dL (ref 0.4–1.8)
TOTAL PROTEIN ELP: 5.8 g/dL — AB (ref 6.0–8.5)

## 2015-02-25 LAB — HAPTOGLOBIN: HAPTOGLOBIN: 76 mg/dL (ref 34–200)

## 2015-02-25 NOTE — Telephone Encounter (Signed)
PT'S DTR AWARE OF APPT. 03/18/15@10 :31 REFERRING DR RAI DX-PANCYTOPENIA

## 2015-02-26 LAB — CULTURE, BLOOD (ROUTINE X 2)
CULTURE: NO GROWTH
Culture: NO GROWTH

## 2015-02-27 ENCOUNTER — Ambulatory Visit: Payer: Self-pay | Admitting: Internal Medicine

## 2015-03-03 ENCOUNTER — Other Ambulatory Visit: Payer: Self-pay | Admitting: *Deleted

## 2015-03-03 MED ORDER — POTASSIUM CHLORIDE CRYS ER 20 MEQ PO TBCR: EXTENDED_RELEASE_TABLET | ORAL | Status: AC

## 2015-03-03 NOTE — Telephone Encounter (Signed)
Received fax from [facility] stating that patient needs a refill for potassium faxed to pharmacy. Faxed.

## 2015-03-06 ENCOUNTER — Encounter: Payer: Self-pay | Admitting: Internal Medicine

## 2015-03-06 ENCOUNTER — Ambulatory Visit (INDEPENDENT_AMBULATORY_CARE_PROVIDER_SITE_OTHER): Payer: Medicare HMO | Admitting: Internal Medicine

## 2015-03-06 VITALS — BP 112/68 | HR 85 | Temp 98.0°F | Resp 16

## 2015-03-06 DIAGNOSIS — F0150 Vascular dementia without behavioral disturbance: Secondary | ICD-10-CM

## 2015-03-06 DIAGNOSIS — N3000 Acute cystitis without hematuria: Secondary | ICD-10-CM

## 2015-03-06 DIAGNOSIS — F22 Delusional disorders: Secondary | ICD-10-CM

## 2015-03-06 DIAGNOSIS — R410 Disorientation, unspecified: Secondary | ICD-10-CM

## 2015-03-06 DIAGNOSIS — K222 Esophageal obstruction: Secondary | ICD-10-CM | POA: Diagnosis not present

## 2015-03-06 DIAGNOSIS — F0151 Vascular dementia with behavioral disturbance: Secondary | ICD-10-CM

## 2015-03-06 DIAGNOSIS — R627 Adult failure to thrive: Secondary | ICD-10-CM

## 2015-03-06 DIAGNOSIS — D696 Thrombocytopenia, unspecified: Secondary | ICD-10-CM | POA: Diagnosis not present

## 2015-03-06 DIAGNOSIS — R634 Abnormal weight loss: Secondary | ICD-10-CM

## 2015-03-06 DIAGNOSIS — F0152 Vascular dementia, unspecified severity, with psychotic disturbance: Secondary | ICD-10-CM

## 2015-03-06 NOTE — Progress Notes (Signed)
Patient ID: [name] [name], female   DOB: [birth year], 81 y.o.   MRN: [id number]   Location: [place] Provider: Rexene Edison. Mariea Clonts, D.O., C.M.D.  Code Status: DNR Goals of Care: Advanced Directive information Does patient have an advance directive?: Yes, Type of Advance Directive: [place] need to do MOST form with her daughter  Chief Complaint  Patient presents with  . Hospitalization Follow-up    Hospital follow-up for urinary tract infection    HPI: Patient is a 81 y.o. female seen in the office today for a hospital f/u.  She'd been increasingly lethargic and at other times agitated.  She has baseline dementia with paranoia and behaviors.  She was less responsive and sent out to the hospital.  They had bene trying to obtain a urine sample, but she was having too loose of stools from too much constipation medication and samples were contaminated.  She was treated for UTI and dehydration.  She was noted to have pancytopenia which was not new.  She received a blood transfusion and was seen by hematology and f/u with them was recommended.  We discussed her goals of care and her daughter does not think following there will change management and I agree.    At the hospital and some meals at [facility], she does not eat a lot.  Her daughter, [name] [name], has been helping her at times by feeding her.  After a while she closes off and won't eat more.    She is technically in AL level.  They've been trying to stretch out her funds.  Getting help with bathing, dressing, grooming and taking her to meals.  Cannot transfer now w/o help.  Gets around in wheelchair now.  Pt is dead weight.  She is frequently incontinent.    She's been very emotional since she was here last--one of her favorite aides left.  Instead of pushing her pendant, she will yell out wherever she is for help.  "somebody help me, somebody help me" This upsets or frightens other residents and she's  supposed to be punching her button when she needs help.  Says they don't come right away all of the time.  Her daughter says they were there within 5-10 mins.    Review of Systems:  Review of Systems  Constitutional: Positive for weight loss and malaise/fatigue. Negative for fever and chills.  HENT: Positive for hearing loss. Negative for congestion.        Edentulous  Eyes: Negative for blurred vision.       Glasses  Respiratory: Negative for shortness of breath.   Cardiovascular: Negative for chest pain.  Gastrointestinal: Positive for constipation. Negative for abdominal pain, blood in stool and melena.  Genitourinary: Positive for urgency and frequency.       Urinary incontinence  Musculoskeletal: Positive for falls.  Skin: Positive for rash.       On buttocks  Neurological: Positive for weakness. Negative for dizziness.  Psychiatric/Behavioral: Positive for memory loss.    Past Medical History  Diagnosis Date  . Atrial fibrillation ([place])   . BRONCHITIS, CHRONIC   . CHEST PAIN   . HYPOKALEMIA   . OBSTRUCTIVE SLEEP APNEA   . Palpitations   . SYSTOLIC HEART FAILURE, CHRONIC   . Depression   . CPAP (continuous positive airway pressure) dependence   . ACE-inhibitor cough   . Cardiac defibrillator in situ   . Chronic renal insufficiency   . GERD (gastroesophageal reflux disease)   .  Hypothyroidism   . Hematuria   . Osteoarthritis (arthritis due to wear and tear of joints)   . Colon polyps   . Chronic rhinitis   . RLS (restless legs syndrome)   . Chronic back pain   . Arthritis   . Glaucoma   . Sleep apnea   . CHF (congestive heart failure) ([place])   . Dementia   . H/O colonoscopy 2005  . Anxiety   . Kidney disease   . Thyroid disease   . Generalized weakness   . Falls     Past Surgical History  Procedure Laterality Date  . Back surgery  1973  . Cardiac defibrillator placement  2005     Jones [id number]  . Skin cancer excision    . Cardiac  catheterization  07/29/2003     Congestive heart failure, questionable etiology.  Increased LVEDP of 51mHg on cath --  Nonobstructive coronary disease --         . Insert / replace / remove pacemaker  01/10/2004    Aborted biventricular implantable cardiac defibrillator implantation, complicated by probable pneumothorax  --  [place] Sprang M.D.  . Cardiac defibrillator placement  01/15/2004    St. Jude 1581 65 cm dual coil active fixation defibrillator lead, serial number [id number]--  St. Jude Atlas +HF model V340CRT defibrillator, serial #[id number] -- Biventricular implantable cardioverter-defibrillator implantation, contrast venography and intraoperative defibrillation threshold testing -- SURGEON:  SD[place] Sprang M.D.  . Implantable cardioverter defibrillator implant  03/17/2009    generator change  --  Unify St. Jude ICD model 3O6296183 serial number [id number] -- SDeb[place] Sprang MD, [facility]  . Femur fracture surgery  2010  . Esophagogastroduodenoscopy  04/13/2011    Procedure: ESOPHAGOGASTRODUODENOSCOPY (EGD);  Surgeon: JZenovia Jarred MD;  [facility];  Service: Gastroenterology;  Laterality: N/A;  may need dilation    Allergies  Allergen Reactions  . Ace Inhibitors Cough  . Requip [Ropinirole Hcl] Other (See Comments)    confusion      Medication List       This list is accurate as of: 03/06/15 11:59 PM.  Always use your most recent med list.               allopurinol 100 MG tablet  Commonly known as:  ZYLOPRIM  Take 1 tablet by mouth daily for Gout     AMBULATORY NON FORMULARY MEDICATION  Wheelchair and Cushion. Patient is unable to propel herself-she is nonambulatory     aspirin EC 81 MG tablet  Take 1 tablet (81 mg total) by mouth daily.     Calcium Carbonate-Vitamin D 600-400 MG-UNIT tablet  Commonly known as:  CALTRATE 600+D  Take one tablet by mouth four times daily with meals     carvedilol 12.5 MG tablet  Commonly known as:  COREG  Take 1/2 tablet by mouth  twice daily     docusate sodium 100 MG capsule  Commonly known as:  COLACE  Take 1 capsule (100 mg total) by mouth 2 (two) times daily.     feeding supplement (ENSURE ENLIVE) Liqd  Take 237 mLs by mouth 2 (two) times daily between meals.     fentaNYL 25 MCG/HR patch  Commonly known as:  DURAGESIC - dosed mcg/hr  Place 1 patch onto the skin every 3 days for pain     ferrous gluconate 324 MG tablet  Commonly known as:  FERGON  Take 1 tablet (324 mg total) by  mouth daily with breakfast.     furosemide 40 MG tablet  Commonly known as:  LASIX  Take 1 tablet (40 mg total) by mouth daily.     hydrALAZINE 10 MG tablet  Commonly known as:  APRESOLINE  Take 1 tablet by mouth 3 times daily, breakfast, 2:00 pm and bedtime for BP     HYDROcodone-acetaminophen 5-325 MG tablet  Commonly known as:  NORCO/VICODIN  Take 1 tablet by mouth every 6 (six) hours as needed for moderate pain. Take one tablet by mouth every 6 hours as needed for leg pain     isosorbide mononitrate 30 MG 24 hr tablet  Commonly known as:  IMDUR  Take 1 tablet by mouth daily for BP     levothyroxine 100 MCG tablet  Commonly known as:  SYNTHROID, LEVOTHROID  Take 1 tablet (100 mcg total) by mouth daily.     memantine 28 MG Cp24 24 hr capsule  Commonly known as:  NAMENDA XR  Take one tablet by mouth once daily for memory     pantoprazole 40 MG tablet  Commonly known as:  PROTONIX  Take 1 tablet by mouth 2 times daily before a meal for GERD     polyethylene glycol packet  Commonly known as:  MIRALAX / GLYCOLAX  Take 17 g by mouth daily. For constipation. Mix with Cranberry juice in the morning     polyvinyl alcohol 1.4 % ophthalmic solution  Commonly known as:  LIQUIFILM TEARS  Place 1 drop into both eyes 3 (three) times daily.     potassium chloride SA 20 MEQ tablet  Commonly known as:  KLOR-CON M20  Take two tablets by mouth twice daily for hypokalemia     QUEtiapine 50 MG tablet  Commonly known as:   SEROQUEL  Take 1 tablet (50 mg total) by mouth at bedtime.     risperiDONE 0.5 MG tablet  Commonly known as:  RISPERDAL  TAKE ONE TABLET BY MOUTH EVERY 6 HOURS AS NEEDED FOR AGITATION.     senna-docusate 8.6-50 MG tablet  Commonly known as:  Senokot-S  Take 2 tablets by mouth daily. Hold for loose stools     sertraline 100 MG tablet  Commonly known as:  ZOLOFT  Take 1 tablet by mouth daily for Depression     Vitamin D (Ergocalciferol) 50000 UNITS Caps capsule  Commonly known as:  DRISDOL  Take 50,000 Units by mouth every 7 (seven) days. Wednesday        Health Maintenance  Topic Date Due  . Samul Dada  11/05/1943  . ZOSTAVAX  11/06/1986  . PNA vac Low Risk Adult (2 of 2 - PCV13) 05/10/2014  . INFLUENZA VACCINE  12/09/2014  . DEXA SCAN  Completed    Physical Exam: Filed Vitals:   03/06/15 1456  BP: 112/68  Pulse: 85  Temp: 98 F (36.7 C)  Resp: 16  SpO2: 97%  pt initially resistant to vitals by CMA, but later agreed There is no weight on file to calculate BMI. Physical Exam  Constitutional:  Frail white female seated in wheelchair  HENT:  Hearing aides; edentulous  Eyes:  glasses  Cardiovascular: Normal rate, regular rhythm, normal heart sounds and intact distal pulses.   Pulmonary/Chest: Effort normal and breath sounds normal.  Abdominal: Soft. Bowel sounds are normal.  Right side of abdomen protuberant  Musculoskeletal:  scoliotic  Neurological: She is alert.  Skin: Skin is warm.  Erythema of buttocks  Psychiatric:  Labile mood--tearful at times,  laughing at others    Labs reviewed: Basic Metabolic Panel:  Recent Labs  10/24/14 1500  02/21/15 1650 02/22/15 0505 02/23/15 0535 02/24/15 0539  NA 141  < >  --  151* 143 143  K 4.5  < >  --  3.7 3.3* 3.9  CL 102  < >  --  117* 107 110  CO2 21  < >  --  _0 GLUCOSE 97  < >  --  105* 114* 112*  BUN 25  < >  --  63* 46* 31*  CREATININE 1.42*  < >  --  2.09* 1.64* 1.41*  CALCIUM 9.1  < >   --  9.2 9.0 9.0  TSH 5.270*  --  0.442  --   --   --   < > = values in this interval not displayed. Liver Function Tests:  Recent Labs  10/24/14 1500 02/21/15 0807 02/22/15 0505  AST _1 ALT 10 12* 10*  ALKPHOS 81 68 46  BILITOT 0.4 1.0 0.7  PROT 6.6 7.7 6.0*  ALBUMIN 4.0 3.8 3.2*   No results for input(s): LIPASE, AMYLASE in the last 8760 hours. No results for input(s): AMMONIA in the last 8760 hours. CBC:  Recent Labs  03/21/14 1008 07/13/14 1214 10/24/14 1500  02/23/15 0535 02/24/15 0539 02/24/15 1549  WBC 7.6 5.0 5.1  < > 3.1* 2.7* 3.4*  NEUTROABS 5.6 3.3 3.5  --   --   --   --   HGB 10.5* 9.0*  --   < > 8.1* 7.8* 9.0*  HCT 32.4* 29.2* 27.1*  < > 25.7* 24.2* 27.6*  MCV 92 100.0  --   < > 98.1 96.0 92.9  PLT 125* 85*  --   < > 39* 38* 37*  < > = values in this interval not displayed. Lipid Panel: No results for input(s): CHOL, HDL, LDLCALC, TRIG, CHOLHDL, LDLDIRECT in the last 8760 hours. No results found for: HGBA1C  Procedures since last visit: Dg Chest Port 1 View  02/21/2015  CLINICAL DATA:  Altered mental status EXAM: PORTABLE CHEST 1 VIEW COMPARISON:  January 10, 2015 FINDINGS: There is no edema or consolidation. Heart size and pulmonary vascularity are within normal limits. Pacemaker leads are attached to the right atrium right ventricle, stable. There is atherosclerotic change in aorta. No adenopathy. No bone lesions. IMPRESSION: No edema or consolidation.  No change in cardiac silhouette. Electronically Signed   By: Lowella Grip III M.D.   On: 02/21/2015 10:08   Assessment/Plan 1. Acute cystitis without hematuria -resolved, completing keflex therapy (4 more days from d/c)  2. Acute delirium -due to #1 -has severe paranoia, agitation and behaviors to begin with so probably was difficult to distinguish -she is still having these due to #3 and depression  3. Vascular dementia with paranoia ([place]) -see #2 -will increase zoloft to 140m daily  and seroquel to 521mat 3pm and at bedtime due to behaviors and combativeness around 4pm, periods of tearfulness -will make adjustments to the seroquel if she gets oversedated -previous use of more risperdal caused oversedation  4. Loss of weight -has esophageal stricture and late stage dementia so expect to some degree -needs to be fed and encouraged -her daughter cannot always be there at meals and she is technically in AL setting -encouraged transition to SNF so staff are more equipped to helping her with her full ADL needs -her daughter is hesitant to transfer her due  to cost issues, pt being accustomed to environment and some of staff and I understand where she is coming from on these issues -given info about private caregiver services to come in at meals when she cannot be there to help prevent weight loss to some degree and encouraged supplements  5. Adult failure to thrive -due to advanced dementia and not helped by esophageal stricture which she is not a candidate to have dilated at this point in her life -discussed hospice, but her daughter is having trouble making ends meet now to pay what she needs to in AL and certainly could not afford to pay outright for the bed though this would be ideal--will still consider referral to discuss options  6. Esophageal stricture -not a candidate for intervention in her frail state  7.  Thrombocytopenia:   -pt with chronic low platelets -she is not a candidate for a bone marrow biopsy or further workup of this condition -her daughter does not want to have to take her to the hematologist--advised to cancel appt  Labs/tests ordered:  No orders of the defined types were placed in this encounter.  opted not to put her through more labs at this time  Next appt:  11/10  [place] Elsen L. Jefrey Raburn, D.O. [facility] [address], [place] [address] Cell Phone (Mon-Fri 8am-5pm):  [phone] On Call:   [phone] & follow prompts after 5pm & weekends Office Phone:  [phone] Office Fax:  [phone]

## 2015-03-06 NOTE — Patient Instructions (Signed)
I recommend skilled care for her if it is financially an option.

## 2015-03-07 ENCOUNTER — Telehealth: Payer: Self-pay

## 2015-03-07 DIAGNOSIS — M25569 Pain in unspecified knee: Secondary | ICD-10-CM

## 2015-03-07 MED ORDER — SERTRALINE HCL 100 MG PO TABS: ORAL_TABLET | ORAL | Status: AC

## 2015-03-07 MED ORDER — HYDROCODONE-ACETAMINOPHEN 5-325 MG PO TABS: 1.0000 | ORAL_TABLET | Freq: Four times a day (QID) | ORAL | Status: DC | PRN

## 2015-03-07 MED ORDER — SERTRALINE HCL 50 MG PO TABS: ORAL_TABLET | ORAL | Status: AC

## 2015-03-07 MED ORDER — QUETIAPINE FUMARATE 50 MG PO TABS: ORAL_TABLET | ORAL | Status: AC

## 2015-03-07 MED ORDER — FENTANYL 25 MCG/HR TD PT72: MEDICATED_PATCH | TRANSDERMAL | Status: DC

## 2015-03-07 NOTE — Telephone Encounter (Signed)
Forgot to get Rx for Fentanyl Patch, also Dr. Mariea Clonts increased the Sertraline to 150mg  needs new Rx, the NH told her she needs new Rx for Hydrocodone. Checked with Dr. Mariea Clonts these are all ok, also write Rx for Seroquel she changed directions.. Left message on Paula's voice mail she can pick up the Rx's.

## 2015-03-08 ENCOUNTER — Other Ambulatory Visit: Payer: Self-pay | Admitting: Internal Medicine

## 2015-03-09 ENCOUNTER — Encounter: Payer: Self-pay | Admitting: Internal Medicine

## 2015-03-11 ENCOUNTER — Encounter: Payer: Self-pay | Admitting: *Deleted

## 2015-03-12 ENCOUNTER — Ambulatory Visit: Payer: Medicare HMO | Admitting: Hematology

## 2015-03-12 ENCOUNTER — Other Ambulatory Visit: Payer: Medicare HMO

## 2015-03-13 ENCOUNTER — Other Ambulatory Visit: Payer: Self-pay | Admitting: Internal Medicine

## 2015-03-13 MED ORDER — DOCUSATE SODIUM 100 MG PO CAPS: 100.0000 mg | ORAL_CAPSULE | Freq: Two times a day (BID) | ORAL | Status: AC

## 2015-03-13 MED ORDER — FERROUS GLUCONATE 324 (38 FE) MG PO TABS: 324.0000 mg | ORAL_TABLET | Freq: Every day | ORAL | Status: AC

## 2015-03-13 NOTE — Progress Notes (Signed)
Received fax from [facility] asking for Rxs for colace and ferrous gluconate that were prescribed in the ED.  These were sent to her pharmacy by Dr. Tana Coast, but new Rxs were printed and will be attached to the med clarification form and also the response to the fact that [facility] feels they can continue to care for her (they are an AL and she now has skilled needs including being fed).  I advised they need to assist her with meals.

## 2015-03-16 ENCOUNTER — Encounter (HOSPITAL_COMMUNITY): Payer: Self-pay | Admitting: Emergency Medicine

## 2015-03-16 ENCOUNTER — Other Ambulatory Visit: Payer: Self-pay | Admitting: Internal Medicine

## 2015-03-16 ENCOUNTER — Inpatient Hospital Stay (HOSPITAL_COMMUNITY)
Admission: EM | Admit: 2015-03-16 | Discharge: 2015-03-20 | DRG: 812 | Disposition: A | Discharge status: Hospice | Payer: Medicare HMO | Attending: Family Medicine | Admitting: Family Medicine

## 2015-03-16 DIAGNOSIS — Z66 Do not resuscitate: Secondary | ICD-10-CM | POA: Diagnosis present

## 2015-03-16 DIAGNOSIS — I5042 Chronic combined systolic (congestive) and diastolic (congestive) heart failure: Secondary | ICD-10-CM | POA: Diagnosis present

## 2015-03-16 DIAGNOSIS — D649 Anemia, unspecified: Secondary | ICD-10-CM | POA: Diagnosis not present

## 2015-03-16 DIAGNOSIS — G2581 Restless legs syndrome: Secondary | ICD-10-CM | POA: Diagnosis present

## 2015-03-16 DIAGNOSIS — R627 Adult failure to thrive: Secondary | ICD-10-CM | POA: Diagnosis present

## 2015-03-16 DIAGNOSIS — F419 Anxiety disorder, unspecified: Secondary | ICD-10-CM | POA: Diagnosis present

## 2015-03-16 DIAGNOSIS — N179 Acute kidney failure, unspecified: Secondary | ICD-10-CM | POA: Diagnosis present

## 2015-03-16 DIAGNOSIS — Z681 Body mass index (BMI) 19 or less, adult: Secondary | ICD-10-CM

## 2015-03-16 DIAGNOSIS — G4733 Obstructive sleep apnea (adult) (pediatric): Secondary | ICD-10-CM | POA: Diagnosis present

## 2015-03-16 DIAGNOSIS — K219 Gastro-esophageal reflux disease without esophagitis: Secondary | ICD-10-CM | POA: Diagnosis present

## 2015-03-16 DIAGNOSIS — E039 Hypothyroidism, unspecified: Secondary | ICD-10-CM

## 2015-03-16 DIAGNOSIS — N390 Urinary tract infection, site not specified: Secondary | ICD-10-CM | POA: Diagnosis present

## 2015-03-16 DIAGNOSIS — Z95810 Presence of automatic (implantable) cardiac defibrillator: Secondary | ICD-10-CM | POA: Diagnosis not present

## 2015-03-16 DIAGNOSIS — Z79899 Other long term (current) drug therapy: Secondary | ICD-10-CM | POA: Diagnosis not present

## 2015-03-16 DIAGNOSIS — E079 Disorder of thyroid, unspecified: Secondary | ICD-10-CM | POA: Diagnosis present

## 2015-03-16 DIAGNOSIS — Z515 Encounter for palliative care: Secondary | ICD-10-CM | POA: Diagnosis not present

## 2015-03-16 DIAGNOSIS — M1990 Unspecified osteoarthritis, unspecified site: Secondary | ICD-10-CM | POA: Diagnosis present

## 2015-03-16 DIAGNOSIS — D469 Myelodysplastic syndrome, unspecified: Secondary | ICD-10-CM | POA: Diagnosis present

## 2015-03-16 DIAGNOSIS — Z7982 Long term (current) use of aspirin: Secondary | ICD-10-CM | POA: Diagnosis not present

## 2015-03-16 DIAGNOSIS — N183 Chronic kidney disease, stage 3 (moderate): Secondary | ICD-10-CM | POA: Diagnosis present

## 2015-03-16 DIAGNOSIS — J42 Unspecified chronic bronchitis: Secondary | ICD-10-CM | POA: Diagnosis present

## 2015-03-16 DIAGNOSIS — I5022 Chronic systolic (congestive) heart failure: Secondary | ICD-10-CM

## 2015-03-16 DIAGNOSIS — I4891 Unspecified atrial fibrillation: Secondary | ICD-10-CM | POA: Diagnosis present

## 2015-03-16 DIAGNOSIS — F0390 Unspecified dementia without behavioral disturbance: Secondary | ICD-10-CM

## 2015-03-16 DIAGNOSIS — R634 Abnormal weight loss: Secondary | ICD-10-CM | POA: Diagnosis present

## 2015-03-16 DIAGNOSIS — D638 Anemia in other chronic diseases classified elsewhere: Secondary | ICD-10-CM | POA: Diagnosis present

## 2015-03-16 DIAGNOSIS — F329 Major depressive disorder, single episode, unspecified: Secondary | ICD-10-CM | POA: Diagnosis present

## 2015-03-16 DIAGNOSIS — Z7189 Other specified counseling: Secondary | ICD-10-CM | POA: Diagnosis not present

## 2015-03-16 DIAGNOSIS — R040 Epistaxis: Secondary | ICD-10-CM | POA: Diagnosis present

## 2015-03-16 DIAGNOSIS — I480 Paroxysmal atrial fibrillation: Secondary | ICD-10-CM | POA: Diagnosis not present

## 2015-03-16 DIAGNOSIS — D61818 Other pancytopenia: Secondary | ICD-10-CM | POA: Diagnosis present

## 2015-03-16 DIAGNOSIS — R630 Anorexia: Secondary | ICD-10-CM | POA: Diagnosis present

## 2015-03-16 DIAGNOSIS — N189 Chronic kidney disease, unspecified: Secondary | ICD-10-CM

## 2015-03-16 DIAGNOSIS — Z87891 Personal history of nicotine dependence: Secondary | ICD-10-CM

## 2015-03-16 DIAGNOSIS — Z888 Allergy status to other drugs, medicaments and biological substances status: Secondary | ICD-10-CM | POA: Diagnosis not present

## 2015-03-16 DIAGNOSIS — I481 Persistent atrial fibrillation: Secondary | ICD-10-CM | POA: Diagnosis not present

## 2015-03-16 DIAGNOSIS — G894 Chronic pain syndrome: Secondary | ICD-10-CM | POA: Diagnosis present

## 2015-03-16 DIAGNOSIS — D696 Thrombocytopenia, unspecified: Secondary | ICD-10-CM

## 2015-03-16 DIAGNOSIS — M25569 Pain in unspecified knee: Secondary | ICD-10-CM

## 2015-03-16 LAB — CBC WITH DIFFERENTIAL/PLATELET
Basophils Absolute: 0 10*3/uL (ref 0.0–0.1)
Basophils Relative: 0 %
Eosinophils Absolute: 0 10*3/uL (ref 0.0–0.7)
Eosinophils Relative: 0 %
HEMATOCRIT: 24.7 % — AB (ref 36.0–46.0)
HEMOGLOBIN: 7.7 g/dL — AB (ref 12.0–15.0)
LYMPHS ABS: 0.8 10*3/uL (ref 0.7–4.0)
LYMPHS PCT: 26 %
MCH: 29.4 pg (ref 26.0–34.0)
MCHC: 31.2 g/dL (ref 30.0–36.0)
MCV: 94.3 fL (ref 78.0–100.0)
MONOS PCT: 5 %
Monocytes Absolute: 0.1 10*3/uL (ref 0.1–1.0)
NEUTROS ABS: 2.1 10*3/uL (ref 1.7–7.7)
NEUTROS PCT: 69 %
Platelets: 44 10*3/uL — ABNORMAL LOW (ref 150–400)
RBC: 2.62 MIL/uL — ABNORMAL LOW (ref 3.87–5.11)
RDW: 17.8 % — ABNORMAL HIGH (ref 11.5–15.5)
WBC: 3.1 10*3/uL — ABNORMAL LOW (ref 4.0–10.5)

## 2015-03-16 LAB — BASIC METABOLIC PANEL
ANION GAP: 10 (ref 5–15)
BUN: 45 mg/dL — ABNORMAL HIGH (ref 6–20)
CHLORIDE: 106 mmol/L (ref 101–111)
CO2: 22 mmol/L (ref 22–32)
Calcium: 9.7 mg/dL (ref 8.9–10.3)
Creatinine, Ser: 3.08 mg/dL — ABNORMAL HIGH (ref 0.44–1.00)
GFR calc non Af Amer: 13 mL/min — ABNORMAL LOW (ref >60)
GFR, EST AFRICAN AMERICAN: 15 mL/min — AB (ref >60)
GLUCOSE: 103 mg/dL — AB (ref 65–99)
Potassium: 5 mmol/L (ref 3.5–5.1)
Sodium: 138 mmol/L (ref 135–145)

## 2015-03-16 LAB — URINE MICROSCOPIC-ADD ON

## 2015-03-16 LAB — URINALYSIS, ROUTINE W REFLEX MICROSCOPIC
BILIRUBIN URINE: NEGATIVE
GLUCOSE, UA: NEGATIVE mg/dL
KETONES UR: NEGATIVE mg/dL
Nitrite: POSITIVE — AB
PROTEIN: NEGATIVE mg/dL
Specific Gravity, Urine: 1.01 (ref 1.005–1.030)
UROBILINOGEN UA: 0.2 mg/dL (ref 0.0–1.0)
pH: 5.5 (ref 5.0–8.0)

## 2015-03-16 LAB — PROTIME-INR
INR: 1.21 (ref 0.00–1.49)
Prothrombin Time: 15.5 seconds — ABNORMAL HIGH (ref 11.6–15.2)

## 2015-03-16 LAB — PREPARE RBC (CROSSMATCH)

## 2015-03-16 MED ORDER — SENNOSIDES-DOCUSATE SODIUM 8.6-50 MG PO TABS
2.0000 | ORAL_TABLET | Freq: Every day | ORAL | Status: DC
  Administered 2015-03-17 – 2015-03-20 (×3): 2 via ORAL
  Filled 2015-03-16 (×3): qty 2

## 2015-03-16 MED ORDER — SERTRALINE HCL 50 MG PO TABS
50.0000 mg | ORAL_TABLET | Freq: Every day | ORAL | Status: DC
  Administered 2015-03-16 – 2015-03-19 (×4): 50 mg via ORAL
  Filled 2015-03-16 (×4): qty 1

## 2015-03-16 MED ORDER — SODIUM CHLORIDE 0.9 % IV SOLN
20.0000 ug | Freq: Once | INTRAVENOUS | Status: AC
  Administered 2015-03-16: 20 ug via INTRAVENOUS
  Filled 2015-03-16: qty 5

## 2015-03-16 MED ORDER — ACETAMINOPHEN 325 MG PO TABS: 650.0000 mg | ORAL_TABLET | Freq: Four times a day (QID) | ORAL | Status: DC | PRN

## 2015-03-16 MED ORDER — SERTRALINE HCL 100 MG PO TABS
100.0000 mg | ORAL_TABLET | Freq: Every day | ORAL | Status: DC
  Administered 2015-03-16 – 2015-03-19 (×4): 100 mg via ORAL
  Filled 2015-03-16 (×4): qty 1

## 2015-03-16 MED ORDER — PANTOPRAZOLE SODIUM 40 MG PO TBEC
40.0000 mg | DELAYED_RELEASE_TABLET | Freq: Two times a day (BID) | ORAL | Status: DC
  Administered 2015-03-17 – 2015-03-20 (×7): 40 mg via ORAL
  Filled 2015-03-16 (×6): qty 1

## 2015-03-16 MED ORDER — SODIUM CHLORIDE 0.9 % IV SOLN
Freq: Once | INTRAVENOUS | Status: AC
  Administered 2015-03-16: 22:00:00 via INTRAVENOUS

## 2015-03-16 MED ORDER — ONDANSETRON HCL 4 MG/2ML IJ SOLN: 4.0000 mg | Freq: Four times a day (QID) | INTRAMUSCULAR | Status: DC | PRN

## 2015-03-16 MED ORDER — SODIUM CHLORIDE 0.9 % IV BOLUS (SEPSIS)
500.0000 mL | Freq: Once | INTRAVENOUS | Status: AC
  Administered 2015-03-16: 500 mL via INTRAVENOUS

## 2015-03-16 MED ORDER — ENSURE ENLIVE PO LIQD: 237.0000 mL | Freq: Two times a day (BID) | ORAL | Status: DC

## 2015-03-16 MED ORDER — ONDANSETRON HCL 4 MG PO TABS: 4.0000 mg | ORAL_TABLET | Freq: Four times a day (QID) | ORAL | Status: DC | PRN

## 2015-03-16 MED ORDER — FENTANYL 25 MCG/HR TD PT72
25.0000 ug | MEDICATED_PATCH | TRANSDERMAL | Status: DC
  Administered 2015-03-16 – 2015-03-19 (×2): 25 ug via TRANSDERMAL
  Filled 2015-03-16 (×2): qty 1

## 2015-03-16 MED ORDER — ACETAMINOPHEN 650 MG RE SUPP: 650.0000 mg | Freq: Four times a day (QID) | RECTAL | Status: DC | PRN

## 2015-03-16 MED ORDER — MEMANTINE HCL ER 28 MG PO CP24
28.0000 mg | ORAL_CAPSULE | Freq: Every day | ORAL | Status: DC
  Administered 2015-03-17 – 2015-03-20 (×4): 28 mg via ORAL
  Filled 2015-03-16 (×5): qty 1

## 2015-03-16 MED ORDER — LEVOTHYROXINE SODIUM 100 MCG PO TABS
100.0000 ug | ORAL_TABLET | Freq: Every day | ORAL | Status: DC
  Administered 2015-03-17 – 2015-03-20 (×4): 100 ug via ORAL
  Filled 2015-03-16 (×4): qty 1

## 2015-03-16 MED ORDER — FERROUS GLUCONATE 324 (38 FE) MG PO TABS
324.0000 mg | ORAL_TABLET | Freq: Every day | ORAL | Status: DC
  Administered 2015-03-17 – 2015-03-20 (×4): 324 mg via ORAL
  Filled 2015-03-16 (×4): qty 1

## 2015-03-16 MED ORDER — QUETIAPINE FUMARATE 25 MG PO TABS
50.0000 mg | ORAL_TABLET | Freq: Two times a day (BID) | ORAL | Status: DC
  Administered 2015-03-16: 50 mg via ORAL
  Filled 2015-03-16: qty 2

## 2015-03-16 MED ORDER — DEXTROSE 5 % IV SOLN
1.0000 g | Freq: Once | INTRAVENOUS | Status: AC
  Administered 2015-03-16: 1 g via INTRAVENOUS
  Filled 2015-03-16: qty 10

## 2015-03-16 MED ORDER — POLYETHYLENE GLYCOL 3350 17 G PO PACK
17.0000 g | PACK | Freq: Every day | ORAL | Status: DC
  Administered 2015-03-17 – 2015-03-20 (×3): 17 g via ORAL
  Filled 2015-03-16 (×3): qty 1

## 2015-03-16 MED ORDER — RISPERIDONE 0.5 MG PO TABS
0.5000 mg | ORAL_TABLET | Freq: Every day | ORAL | Status: DC
  Administered 2015-03-16 – 2015-03-20 (×5): 0.5 mg via ORAL
  Filled 2015-03-16 (×6): qty 1

## 2015-03-16 NOTE — H&P (Signed)
[facility] History and Physical  Patient: [name] [name]  MRN: [id number]  DOB: 01-Apr-1925  DOS: the patient was seen and examined on 03/16/2015 PCP: Hollace Kinnier, DO  Referring physician: Dr. [name] Singer Chief Complaint: Nose bleeding  HPI: [name] [name] is a 81 y.o. female with Past medical history of chronic pancytopenia, A. fib not on any anticoagulation due to bleeding and fall, chronic kidney disease, AICD implant, hypothyroidism, GERD, chronic pain syndrome, dementia, chronic systolic CHF. The patient is presenting with complaints of nosebleeding. Patient is a resident at assisted living facility [facility]. Patient earlier in the morning suddenly started having complaints of bleeding from her nose. They tried to apply pressure and conservative methods with the bleeding did not stop. Therefore they called EMS and the patient was brought to the hospital for further workup. In the ER the patient received Afrin nasal spray and her bleeding stopped. There is no reoccurrence under my evaluation. Patient is also receiving DDAVP at the time of my evaluation per ER physician. Patient denies having any complaints of fever or chills chest pain and abdominal pain or shortness of breath. No fall no trauma no injury reported. Recently she has been having increasing episodes of sundowning and they have increased her Zoloft as well as started her on Seroquel. No other recent change in her medications reported. She has poor by mouth intake  The patient is coming from ALF.  At her baseline does not ambulate and requires assistance And is dependent for most of her ADL; does not manages her medication on her own.  Review of Systems: as mentioned in the history of present illness.  A comprehensive review of the other systems is negative.  Past Medical History  Diagnosis Date  . Atrial fibrillation ([place])   . BRONCHITIS, CHRONIC   . CHEST PAIN   . HYPOKALEMIA   .  OBSTRUCTIVE SLEEP APNEA   . Palpitations   . SYSTOLIC HEART FAILURE, CHRONIC   . Depression   . CPAP (continuous positive airway pressure) dependence   . ACE-inhibitor cough   . Cardiac defibrillator in situ   . Chronic renal insufficiency   . GERD (gastroesophageal reflux disease)   . Hypothyroidism   . Hematuria   . Osteoarthritis (arthritis due to wear and tear of joints)   . Colon polyps   . Chronic rhinitis   . RLS (restless legs syndrome)   . Chronic back pain   . Arthritis   . Glaucoma   . Sleep apnea   . CHF (congestive heart failure) ([place])   . Dementia   . H/O colonoscopy 2005  . Anxiety   . Kidney disease   . Thyroid disease   . Generalized weakness   . Falls    Past Surgical History  Procedure Laterality Date  . Back surgery  1973  . Cardiac defibrillator placement  2005     [place] [phone]  . Skin cancer excision    . Cardiac catheterization  07/29/2003     Congestive heart failure, questionable etiology.  Increased LVEDP of 35mmHg on cath --  Nonobstructive coronary disease --         . Insert / replace / remove pacemaker  01/10/2004    Aborted biventricular implantable cardiac defibrillator implantation, complicated by probable pneumothorax  --  Deboraha Sprang, M.D.  . Cardiac defibrillator placement  01/15/2004    St. Jude 1581 65 cm dual coil active fixation defibrillator lead, serial number [id number] --  St.  Jude Atlas +HF model V340CRT defibrillator, serial [id number]. -- Biventricular implantable cardioverter-defibrillator implantation, contrast venography and intraoperative defibrillation threshold testing -- SURGEON:  Deboraha Sprang, M.D.  . Implantable cardioverter defibrillator implant  03/17/2009    generator change  --  Unify St. Jude ICD model O6296183, serial number [id number]  -- Deboraha Sprang, MD, [facility]   . Femur fracture surgery  2010  . Esophagogastroduodenoscopy  04/13/2011    Procedure: ESOPHAGOGASTRODUODENOSCOPY (EGD);  Surgeon: Zenovia Jarred,  MD;  Location: [facility];  Service: Gastroenterology;  Laterality: N/A;  may need dilation   Social History:  reports that she quit smoking about 11 years ago. Her smoking use included Cigarettes. She smoked 1.00 pack per day. She has never used smokeless tobacco. She reports that she does not drink alcohol or use illicit drugs.  Allergies  Allergen Reactions  . Ace Inhibitors Cough  . Requip [Ropinirole Hcl] Other (See Comments)    confusion    Family History  Problem Relation Age of Onset  . Heart disease Mother   . Colon cancer Mother   . Colon polyps Mother   . Kidney disease Mother   . Emphysema Father   . Liver disease Brother   . Kidney disease Maternal Grandmother   . Breast cancer Maternal Aunt   . Heart disease Father     Prior to Admission medications   Medication Sig Start Date End Date Taking? Authorizing Provider  allopurinol (ZYLOPRIM) 100 MG tablet Take 1 tablet by mouth daily for Gout 02/18/14  Yes Lauree Chandler, NP  aspirin EC 81 MG tablet Take 1 tablet (81 mg total) by mouth daily. 10/14/11  Yes Deboraha Sprang, MD  barrier cream (NON-SPECIFIED) CREA Apply 1 application topically daily.   Yes Historical Provider, MD  Calcium Carbonate-Vitamin D (CALTRATE 600+D) 600-400 MG-UNIT per tablet Take one tablet by mouth four times daily with meals 11/19/14  Yes Lauree Chandler, NP  carvedilol (COREG) 12.5 MG tablet Take 1/2 tablet by mouth twice daily Patient taking differently: Take 6.25 mg by mouth 2 (two) times daily with a meal.  02/18/14  Yes Lauree Chandler, NP  docusate sodium (COLACE) 100 MG capsule Take 1 capsule (100 mg total) by mouth 2 (two) times daily. 03/13/15  Yes [name] L Reed, DO  fentaNYL (DURAGESIC - DOSED MCG/HR) 25 MCG/HR patch Place 1 patch onto the skin every 3 days for pain 03/07/15  Yes [name] L Reed, DO  ferrous gluconate (FERGON) 324 MG tablet Take 1 tablet (324 mg total) by mouth daily with breakfast. 03/13/15  Yes [name] L Reed, DO    furosemide (LASIX) 40 MG tablet Take 1 tablet (40 mg total) by mouth daily. 02/27/15  Yes Ripudeep Krystal Eaton, MD  hydrALAZINE (APRESOLINE) 10 MG tablet Take 1 tablet by mouth 3 times daily, breakfast, 2:00 pm and bedtime for BP 02/18/14  Yes Lauree Chandler, NP  HYDROcodone-acetaminophen (NORCO/VICODIN) 5-325 MG tablet Take 1 tablet by mouth every 6 (six) hours as needed for moderate pain. Take one tablet by mouth every 6 hours as needed for leg pain 03/07/15  Yes [name] L Reed, DO  hydroxypropyl methylcellulose / hypromellose (ISOPTO TEARS / GONIOVISC) 2.5 % ophthalmic solution Place 1 drop into both eyes 3 (three) times daily.   Yes Historical Provider, MD  isosorbide mononitrate (IMDUR) 30 MG 24 hr tablet Take 1 tablet by mouth daily for BP 02/18/14  Yes Lauree Chandler, NP  levothyroxine (SYNTHROID, LEVOTHROID) 100 MCG tablet TAKE  1 TABLET BY MOUTH 30 MIN. BEFORE BREAKFAST DAILY 03/10/15  Yes [name] L Reed, DO  NAMENDA XR 28 MG CP24 24 hr capsule TAKE ONE TABLET BY MOUTH ONCE DAILY FOR MEMORY 03/10/15  Yes [name] L Reed, DO  nystatin (MYCOSTATIN/NYSTOP) 100000 UNIT/GM POWD Apply 1 g topically daily.   Yes Historical Provider, MD  pantoprazole (PROTONIX) 40 MG tablet Take 1 tablet by mouth 2 times daily before a meal for GERD 05/16/14  Yes [name] L Reed, DO  polyethylene glycol (MIRALAX / GLYCOLAX) packet Take 17 g by mouth daily. For constipation. Mix with Cranberry juice in the morning 02/10/15  Yes [name] L Reed, DO  potassium chloride SA (KLOR-CON M20) 20 MEQ tablet Take two tablets by mouth twice daily for hypokalemia 03/03/15  Yes [name] L Reed, DO  QUEtiapine (SEROQUEL) 50 MG tablet Take one tablet at 3:00 pm and one tablet at bedtime Patient taking differently: Take 50 mg by mouth 2 (two) times daily. Take one tablet at 3:00 pm and one tablet at bedtime 03/07/15  Yes [name] L Reed, DO  risperiDONE (RISPERDAL) 0.5 MG tablet TAKE ONE TABLET BY MOUTH EVERY 6 HOURS AS NEEDED FOR AGITATION.  04/15/14  Yes [name] L Reed, DO  senna-docusate (SENOKOT-S) 8.6-50 MG per tablet Take 2 tablets by mouth daily. Hold for loose stools 03/21/14  Yes [name] L Reed, DO  sertraline (ZOLOFT) 100 MG tablet Take one 100mg  tablet with 50mg  tablet = 150mg  once daily 03/07/15  Yes [name] L Reed, DO  sertraline (ZOLOFT) 50 MG tablet Take one 50mg  tablet with 100mg  tablet = 150mg  once daily 03/07/15  Yes [name] L Reed, DO  Vitamin D, Ergocalciferol, (DRISDOL) 50000 UNITS CAPS capsule Take 50,000 Units by mouth every 7 (seven) days. Wednesday   Yes Historical Provider, MD  AMBULATORY NON FORMULARY MEDICATION Wheelchair and Cushion. Patient is unable to propel herself-she is nonambulatory 05/28/14   [name] L Reed, DO  feeding supplement, ENSURE ENLIVE, (ENSURE ENLIVE) LIQD Take 237 mLs by mouth 2 (two) times daily between meals. 02/24/15   Ripudeep Krystal Eaton, MD    Physical Exam: Filed Vitals:   03/16/15 2000 03/16/15 2015 03/16/15 2030 03/16/15 2059  BP: 121/66 104/62 125/59 134/49  Pulse: 93 99  87  Temp:    98.3 F (36.8 C)  TempSrc:    Oral  Resp: 22  20 16   Height:    5' 4.8" (1.646 m)  Weight:    54.5 kg (120 lb 2.4 oz)  SpO2: 95% 98%  90%    General: Alert, Awake and Oriented to Person. Appear in mild distress Eyes: PERRL ENT: Oral Mucosa clear moist. Neck: no JVD Cardiovascular: S1 and S2 Present, no Murmur, Peripheral Pulses Present Respiratory: Bilateral Air entry equal and Decreased,  Clear to Auscultation, no Crackles, no wheezes Abdomen: Bowel Sound present, Soft and no tenderness Skin: no Rash Extremities: no Pedal edema, no calf tenderness Neurologic: Grossly no focal neuro deficit.  Labs on Admission:  CBC:  Recent Labs Lab 03/16/15 1857  WBC 3.1*  NEUTROABS 2.1  HGB 7.7*  HCT 24.7*  MCV 94.3  PLT 44*    CMP     Component Value Date/Time   NA 138 03/16/2015 1441   NA 141 10/24/2014 1500   K 5.0 03/16/2015 1441   CL 106 03/16/2015 1441   CO2 22 03/16/2015 1441    GLUCOSE 103* 03/16/2015 1441   GLUCOSE 97 10/24/2014 1500   BUN 45* 03/16/2015 1441   BUN 25  10/24/2014 1500   CREATININE 3.08* 03/16/2015 1441   CALCIUM 9.7 03/16/2015 1441   PROT 6.0* 02/22/2015 0505   PROT 6.6 10/24/2014 1500   ALBUMIN 3.2* 02/22/2015 0505   ALBUMIN 4.0 10/24/2014 1500   AST 24 02/22/2015 0505   ALT 10* 02/22/2015 0505   ALKPHOS 46 02/22/2015 0505   BILITOT 0.7 02/22/2015 0505   BILITOT 0.4 10/24/2014 1500   GFRNONAA 13* 03/16/2015 1441   GFRAA 15* 03/16/2015 1441    No results for input(s): CKTOTAL, CKMB, CKMBINDEX, TROPONINI in the last 168 hours. BNP (last 3 results) No results for input(s): BNP in the last 8760 hours.  ProBNP (last 3 results) No results for input(s): PROBNP in the last 8760 hours.   Radiological Exams on Admission: No results found. Assessment/Plan 1. Symptomatic anemia The patient is presenting with complaints of epistaxis. At the bleeding has currently stopped. Her H&H has dropped from 9-7.7. I do not think that this is all acute bleeding and most likely contamination off her myelodysplastic syndrome which is causing her to have pancytopenia. Currently I would give her one unit of PRBC. Monitor on telemetry. Monitor for further bleeding. Family currently is contemplating the need for further workup as well as a hematology consult and is in the favor of no further workup or consultation as they feel that extensive diagnostic workup would not change the outcome or management and I currently agree. Continue iron supplementation. We'll start her on B-12 supplementation. Also start her on folic acid supplementation.  2.Atrial fibrillation ([facility])   Biventricular implantable cardioverter-defibrillator LV lead off   Chronic systolic congestive heart failure ([place]) At present I'm holding her Lasix and I'm in the favor of switching Lasix to as needed basis at the time of discharge. She has atrial fibrillation with significantly high  Chadvasc score, but she has recurrent fall and now she has worsening thrombocytopenia causing epistaxis. At present I recommend to discontinue aspirin as well, and will currently be on hold.  3  Adult failure to thrive Continue nutritional supplements.  4  Hypothyroidism Continue home medication.  5  Acute on chronic renal failure ([place]) Most likely in the setting of continued use of Lasix and antihypertensive medication with worsening poor oral intake. Patient will be receiving PRBC and we will recheck her BMP in the morning.  6   Dementia continue home medications at present. Monitor for delirium.  7  Epistaxis Bleeding is currently stopped. Patient has received Afrin nasal spray as well as DDAVP. Continue monitoring  8. Chronic pain. Patient is on fentanyl patch at the nursing home which I will continue.  Nutrition: Regular diet  DVT Prophylaxis: mechanical compression device  Advance goals of care discussion: DNR/DNI   Family Communication: family was present at bedside, opportunity was given to ask question and all questions were answered satisfactorily at the time of interview. Disposition: Admitted as inpatient, telemetry unit.  Author: Berle Mull, MD [facility] Pager: [phone] 03/16/2015  If 7PM-7AM, please contact night-coverage [email or web address] Password [email or web address]

## 2015-03-16 NOTE — ED Notes (Signed)
Admitting MD at bedside.

## 2015-03-16 NOTE — ED Notes (Signed)
Pt arrives from [facility] assisted living via [facility] c/o nosebleed starting this am.  Pt denies hx of same.  Per EMS pt has some hx dementia.  Pt oriented to self, place and situation, disoriented to time.  NAD noted at this time. Bleeding controlled with guaze.

## 2015-03-16 NOTE — ED Provider Notes (Signed)
Care assumed from Dr. Wyvonnia Dusky.  Plan for admission following repeat CBC.  In brief, pt has a recent hx of thrombocytopenia. Concern for MDS.  Pt presented with epistaxis, now with bleeding controlled following topical agents.  Family weighing risks/benefits of transfusion of blood or platelets at this time, as patient's condition may be terminal if she has an advanced blood dyscrasia, and they would not want prolonged measures if prognosis is poor, or if patient would not benefit significantly from therapy.  Pt uremic.  Likely worsening renal function with low PO intake.    Plts are stable.  Pt given DDAVP for uremia in the setting of low platelets.  Worsening Hgb.   Pt discussed with hospital medicine.  Pt is HDS upon transfer.  Patient care was discussed with my attending, Dr. Ashok Cordia.     Hoyle Sauer, MD 03/17/15 [place], MD 03/17/15 [phone]

## 2015-03-16 NOTE — Progress Notes (Signed)
Report received from [place], [place] from ED. Pt is to be admitted in 5W12. Awaiting pt's arrival.

## 2015-03-16 NOTE — ED Provider Notes (Signed)
CSN: [id number]     Arrival date & time 03/16/15  1323 History   First MD Initiated Contact with Patient 03/16/15 1335     Chief Complaint  Patient presents with  . Epistaxis     (Consider location/radiation/quality/duration/timing/severity/associated sxs/prior Treatment) HPI Comments: Level 5 caveat for dementia. From ECF with R sided nosebleed since this morning. Not controlled with pressure. No hx of same.  Denies blood thinner use but has known thrombocytopenia.  No CP or SOB.  No abdominal pain, nausea, vomiting.  Recent admission for UTI.  Baseline level of confusion per daughter.   The history is provided by the patient and a relative. The history is limited by the condition of the patient.    Past Medical History  Diagnosis Date  . Atrial fibrillation ([place])   . BRONCHITIS, CHRONIC   . CHEST PAIN   . HYPOKALEMIA   . OBSTRUCTIVE SLEEP APNEA   . Palpitations   . SYSTOLIC HEART FAILURE, CHRONIC   . Depression   . CPAP (continuous positive airway pressure) dependence   . ACE-inhibitor cough   . Cardiac defibrillator in situ   . Chronic renal insufficiency   . GERD (gastroesophageal reflux disease)   . Hypothyroidism   . Hematuria   . Osteoarthritis (arthritis due to wear and tear of joints)   . Colon polyps   . Chronic rhinitis   . RLS (restless legs syndrome)   . Chronic back pain   . Arthritis   . Glaucoma   . Sleep apnea   . CHF (congestive heart failure) ([place])   . Dementia   . H/O colonoscopy 2005  . Anxiety   . Kidney disease   . Thyroid disease   . Generalized weakness   . Falls    Past Surgical History  Procedure Laterality Date  . Back surgery  1973  . Cardiac defibrillator placement  2005     [place] [phone]  . Skin cancer excision    . Cardiac catheterization  07/29/2003     Congestive heart failure, questionable etiology.  Increased LVEDP of 25mmHg on cath --  Nonobstructive coronary disease --         . Insert / replace / remove  pacemaker  01/10/2004    Aborted biventricular implantable cardiac defibrillator implantation, complicated by probable pneumothorax  --  Deboraha Sprang, M.D.  . Cardiac defibrillator placement  01/15/2004    St. Jude 1581 65 cm dual coil active fixation defibrillator lead, serial number [id number] --  St. Jude Atlas +HF model V340CRT defibrillator, serial [id number]. -- Biventricular implantable cardioverter-defibrillator implantation, contrast venography and intraoperative defibrillation threshold testing -- SURGEON:  Deboraha Sprang, M.D.  . Implantable cardioverter defibrillator implant  03/17/2009    generator change  --  Unify St. Jude ICD model O6296183, serial number [id number]  -- Deboraha Sprang, MD, [facility]   . Femur fracture surgery  2010  . Esophagogastroduodenoscopy  04/13/2011    Procedure: ESOPHAGOGASTRODUODENOSCOPY (EGD);  Surgeon: Zenovia Jarred, MD;  Location: [facility];  Service: Gastroenterology;  Laterality: N/A;  may need dilation   Family History  Problem Relation Age of Onset  . Heart disease Mother   . Colon cancer Mother   . Colon polyps Mother   . Kidney disease Mother   . Emphysema Father   . Liver disease Brother   . Kidney disease Maternal Grandmother   . Breast cancer Maternal Aunt   . Heart disease Father    Social History  Substance Use Topics  . Smoking status: Former Smoker -- 1.00 packs/day    Types: Cigarettes    Quit date: 04/10/2003  . Smokeless tobacco: Never Used  . Alcohol Use: No   OB History    No data available     Review of Systems  Unable to perform ROS: Dementia  HENT: Positive for nosebleeds.       Allergies  Ace inhibitors and Requip  Home Medications   Prior to Admission medications   Medication Sig Start Date End Date Taking? Authorizing Provider  allopurinol (ZYLOPRIM) 100 MG tablet Take 1 tablet by mouth daily for Gout 02/18/14  Yes Lauree Chandler, NP  aspirin EC 81 MG tablet Take 1 tablet (81 mg total) by mouth daily. 10/14/11   Yes Deboraha Sprang, MD  barrier cream (NON-SPECIFIED) CREA Apply 1 application topically daily.   Yes Historical Provider, MD  Calcium Carbonate-Vitamin D (CALTRATE 600+D) 600-400 MG-UNIT per tablet Take one tablet by mouth four times daily with meals 11/19/14  Yes Lauree Chandler, NP  carvedilol (COREG) 12.5 MG tablet Take 1/2 tablet by mouth twice daily Patient taking differently: Take 6.25 mg by mouth 2 (two) times daily with a meal.  02/18/14  Yes Lauree Chandler, NP  docusate sodium (COLACE) 100 MG capsule Take 1 capsule (100 mg total) by mouth 2 (two) times daily. 03/13/15  Yes [name] L Reed, DO  fentaNYL (DURAGESIC - DOSED MCG/HR) 25 MCG/HR patch Place 1 patch onto the skin every 3 days for pain 03/07/15  Yes [name] L Reed, DO  ferrous gluconate (FERGON) 324 MG tablet Take 1 tablet (324 mg total) by mouth daily with breakfast. 03/13/15  Yes [name] L Reed, DO  furosemide (LASIX) 40 MG tablet Take 1 tablet (40 mg total) by mouth daily. 02/27/15  Yes Ripudeep Krystal Eaton, MD  hydrALAZINE (APRESOLINE) 10 MG tablet Take 1 tablet by mouth 3 times daily, breakfast, 2:00 pm and bedtime for BP 02/18/14  Yes Lauree Chandler, NP  HYDROcodone-acetaminophen (NORCO/VICODIN) 5-325 MG tablet Take 1 tablet by mouth every 6 (six) hours as needed for moderate pain. Take one tablet by mouth every 6 hours as needed for leg pain 03/07/15  Yes [name] L Reed, DO  hydroxypropyl methylcellulose / hypromellose (ISOPTO TEARS / GONIOVISC) 2.5 % ophthalmic solution Place 1 drop into both eyes 3 (three) times daily.   Yes Historical Provider, MD  isosorbide mononitrate (IMDUR) 30 MG 24 hr tablet Take 1 tablet by mouth daily for BP 02/18/14  Yes Lauree Chandler, NP  levothyroxine (SYNTHROID, LEVOTHROID) 100 MCG tablet TAKE 1 TABLET BY MOUTH 30 MIN. BEFORE BREAKFAST DAILY 03/10/15  Yes [name] L Reed, DO  NAMENDA XR 28 MG CP24 24 hr capsule TAKE ONE TABLET BY MOUTH ONCE DAILY FOR MEMORY 03/10/15  Yes [name] L Reed, DO   nystatin (MYCOSTATIN/NYSTOP) 100000 UNIT/GM POWD Apply 1 g topically daily.   Yes Historical Provider, MD  pantoprazole (PROTONIX) 40 MG tablet Take 1 tablet by mouth 2 times daily before a meal for GERD 05/16/14  Yes [name] L Reed, DO  polyethylene glycol (MIRALAX / GLYCOLAX) packet Take 17 g by mouth daily. For constipation. Mix with Cranberry juice in the morning 02/10/15  Yes [name] L Reed, DO  potassium chloride SA (KLOR-CON M20) 20 MEQ tablet Take two tablets by mouth twice daily for hypokalemia 03/03/15  Yes [name] L Reed, DO  QUEtiapine (SEROQUEL) 50 MG tablet Take one tablet at 3:00 pm and one tablet  at bedtime Patient taking differently: Take 50 mg by mouth 2 (two) times daily. Take one tablet at 3:00 pm and one tablet at bedtime 03/07/15  Yes [name] L Reed, DO  risperiDONE (RISPERDAL) 0.5 MG tablet TAKE ONE TABLET BY MOUTH EVERY 6 HOURS AS NEEDED FOR AGITATION. 04/15/14  Yes [name] L Reed, DO  senna-docusate (SENOKOT-S) 8.6-50 MG per tablet Take 2 tablets by mouth daily. Hold for loose stools 03/21/14  Yes [name] L Reed, DO  sertraline (ZOLOFT) 100 MG tablet Take one 100mg  tablet with 50mg  tablet = 150mg  once daily 03/07/15  Yes [name] L Reed, DO  sertraline (ZOLOFT) 50 MG tablet Take one 50mg  tablet with 100mg  tablet = 150mg  once daily 03/07/15  Yes [name] L Reed, DO  Vitamin D, Ergocalciferol, (DRISDOL) 50000 UNITS CAPS capsule Take 50,000 Units by mouth every 7 (seven) days. Wednesday   Yes Historical Provider, MD  AMBULATORY NON FORMULARY MEDICATION Wheelchair and Cushion. Patient is unable to propel herself-she is nonambulatory 05/28/14   [name] L Reed, DO  feeding supplement, ENSURE ENLIVE, (ENSURE ENLIVE) LIQD Take 237 mLs by mouth 2 (two) times daily between meals. 02/24/15   Ripudeep K Rai, MD   BP 138/72 mmHg  Pulse 98  Temp(Src) 97.3 F (36.3 C) (Oral)  Resp 18  SpO2 97% Physical Exam  Constitutional: She appears well-developed and well-nourished. No distress.   Disoriented per baseline  HENT:  Head: Normocephalic and atraumatic.  Mouth/Throat: Oropharynx is clear and moist.  Small amount of oozing blood in right nare. Oropharynx with some blood clots that clear with gargling. No active bleeding.  Eyes: Conjunctivae and EOM are normal. Pupils are equal, round, and reactive to light.  Neck: Normal range of motion. Neck supple.  Cardiovascular: Normal rate, regular rhythm and normal heart sounds.   No murmur heard. Pulmonary/Chest: Effort normal and breath sounds normal. No respiratory distress. She exhibits no tenderness.  Abdominal: Soft. There is no tenderness. There is no rebound and no guarding.  Musculoskeletal: Normal range of motion. She exhibits no edema or tenderness.  Neurological: She is alert. No cranial nerve deficit. She exhibits normal muscle tone. Coordination normal.  Oriented to person and place  Skin: Skin is warm.    ED Course  .Epistaxis Management Date/Time: 03/16/2015 4:34 PM Performed by: Ezequiel Essex Authorized by: Ezequiel Essex Consent: Verbal consent obtained. Risks and benefits: risks, benefits and alternatives were discussed Consent given by: patient and power of attorney Patient understanding: patient states understanding of the procedure being performed Patient identity confirmed: provided demographic data and verbally with patient Time out: Immediately prior to procedure a "time out" was called to verify the correct patient, procedure, equipment, support staff and site/side marked as required. Local anesthetic: topical anesthetic Patient sedated: no Treatment site: right anterior Repair method: cophenylcaine Post-procedure assessment: bleeding stopped Treatment complexity: simple Patient tolerance: Patient tolerated the procedure well with no immediate complications   (including critical care time) Labs Review Labs Reviewed  BASIC METABOLIC PANEL - Abnormal; Notable for the following:    Glucose,  Bld 103 (*)    BUN 45 (*)    Creatinine, Ser 3.08 (*)    GFR calc non Af Amer 13 (*)    GFR calc Af Amer 15 (*)    All other components within normal limits  CBC WITH DIFFERENTIAL/PLATELET  CBC WITH DIFFERENTIAL/PLATELET  PROTIME-INR  URINALYSIS, ROUTINE W REFLEX MICROSCOPIC (NOT AT [facility])    Imaging Review No results found. I have personally reviewed and evaluated  these images and lab results as part of my medical decision-making.   EKG Interpretation None      MDM   Final diagnoses:  Acute renal failure, unspecified acute renal failure type (HCC)  Epistaxis  Thrombocytopenia (HCC)   patient from nursing facility with nosebleed since this morning. It is controlled on arrival with slight oozing from her right naris. Clots evacuated from posterior pharynx. No chest pain or shortness of breath. She is not anticoagulated but does have a history of thrombocytopenia.  Nosebleed controlled with afrin and pressure.  No recurrence of bleed.  No blood in OP.  Labs show worsening kidney function. Was hospitalized recently for AKI and UTI.  CBC and INR pending at time of sign out. Will need admission for IVF hydration and monitoring of nosebleed. Family agreeable to possible platelet transfusion. Dr Estanislado Spire to assume care and admit.    Ezequiel Essex, MD 03/16/15 1745

## 2015-03-17 ENCOUNTER — Telehealth: Payer: Self-pay | Admitting: Hematology

## 2015-03-17 DIAGNOSIS — I480 Paroxysmal atrial fibrillation: Secondary | ICD-10-CM

## 2015-03-17 DIAGNOSIS — E038 Other specified hypothyroidism: Secondary | ICD-10-CM

## 2015-03-17 LAB — TYPE AND SCREEN
ABO/RH(D): O NEG
ANTIBODY SCREEN: NEGATIVE
Unit division: 0

## 2015-03-17 LAB — CBC WITH DIFFERENTIAL/PLATELET
BASOS ABS: 0 10*3/uL (ref 0.0–0.1)
BASOS PCT: 0 %
EOS ABS: 0 10*3/uL (ref 0.0–0.7)
EOS PCT: 0 %
HEMATOCRIT: 25.8 % — AB (ref 36.0–46.0)
Hemoglobin: 8.2 g/dL — ABNORMAL LOW (ref 12.0–15.0)
Lymphocytes Relative: 24 %
Lymphs Abs: 0.7 10*3/uL (ref 0.7–4.0)
MCH: 29.5 pg (ref 26.0–34.0)
MCHC: 31.8 g/dL (ref 30.0–36.0)
MCV: 92.8 fL (ref 78.0–100.0)
MONO ABS: 0.1 10*3/uL (ref 0.1–1.0)
MONOS PCT: 5 %
NEUTROS ABS: 1.9 10*3/uL (ref 1.7–7.7)
Neutrophils Relative %: 71 %
Platelets: 32 10*3/uL — ABNORMAL LOW (ref 150–400)
RBC: 2.78 MIL/uL — ABNORMAL LOW (ref 3.87–5.11)
RDW: 18.6 % — AB (ref 11.5–15.5)
WBC: 2.7 10*3/uL — ABNORMAL LOW (ref 4.0–10.5)

## 2015-03-17 LAB — COMPREHENSIVE METABOLIC PANEL
ALBUMIN: 2.7 g/dL — AB (ref 3.5–5.0)
ALT: 9 U/L — ABNORMAL LOW (ref 14–54)
AST: 21 U/L (ref 15–41)
Alkaline Phosphatase: 57 U/L (ref 38–126)
Anion gap: 8 (ref 5–15)
BILIRUBIN TOTAL: 1.3 mg/dL — AB (ref 0.3–1.2)
BUN: 47 mg/dL — AB (ref 6–20)
CO2: 26 mmol/L (ref 22–32)
Calcium: 8.9 mg/dL (ref 8.9–10.3)
Chloride: 105 mmol/L (ref 101–111)
Creatinine, Ser: 2.97 mg/dL — ABNORMAL HIGH (ref 0.44–1.00)
GFR calc Af Amer: 15 mL/min — ABNORMAL LOW (ref >60)
GFR calc non Af Amer: 13 mL/min — ABNORMAL LOW (ref >60)
GLUCOSE: 108 mg/dL — AB (ref 65–99)
POTASSIUM: 4.1 mmol/L (ref 3.5–5.1)
Sodium: 139 mmol/L (ref 135–145)
TOTAL PROTEIN: 6.2 g/dL — AB (ref 6.5–8.1)

## 2015-03-17 LAB — T4, FREE: Free T4: 0.8 ng/dL (ref 0.61–1.12)

## 2015-03-17 LAB — TSH: TSH: 3.142 u[IU]/mL (ref 0.350–4.500)

## 2015-03-17 MED ORDER — QUETIAPINE FUMARATE 25 MG PO TABS
50.0000 mg | ORAL_TABLET | Freq: Every day | ORAL | Status: DC
  Administered 2015-03-17: 50 mg via ORAL
  Filled 2015-03-17: qty 2

## 2015-03-17 MED ORDER — ENSURE ENLIVE PO LIQD
237.0000 mL | Freq: Three times a day (TID) | ORAL | Status: DC
  Administered 2015-03-17 – 2015-03-20 (×10): 237 mL via ORAL

## 2015-03-17 MED ORDER — CEFTRIAXONE SODIUM 1 G IJ SOLR
1.0000 g | INTRAMUSCULAR | Status: DC
  Administered 2015-03-17: 1 g via INTRAVENOUS
  Filled 2015-03-17 (×2): qty 10

## 2015-03-17 NOTE — Progress Notes (Addendum)
[facility] Progress Note   [name] [name] [facility]  EYC:[id number]  DOB: 10-08-1924  DOA: 03/16/2015 PCP: Hollace Kinnier, DO  Brief narrative: [name] [name] is a 81 y.o. female with h/o myelodysplastic syndrome and pancytopenia, A-fib on ASA, CKD, AICD, systolic CHF, Dementia who presents with epistaxis and is found to have ARF and anemia. Epistaxis was controlled by afrin and DDAVP and has resolved. She is admitted for treatment of renal failure and anemia. Also found to have a positive UA. She admits to a poor PO intake for 1wk.   Subjective: No complaints. Still has a poor appetite. No fever, chills, pain, nausea, vomiting, constipation or diarrhea.   Assessment/Plan: Principal Problem:   Symptomatic anemia - likely due to underlying myelodysplastic syndrome  - has been transfused 1 U PRBC with Hb improving from 7.7 to 8.2  Active Problems: Pancytopenia - stable- related to myelodysplastic syndrome  Acute renal failure - suspecting prerenal etiology - Lasix on hold - baseline Cr about 1.4- 1.6  Nose bleed - in setting of thrombocytopenia and ASA use- resolved  Atrial fibrillation - paroxsymal - currently NSR - on ASA 81 mg at home- currently on hold due to nose bleed    Biventricular implantable cardioverter-defibrillator LV lead off  Chronic systolic congestive heart failure  -EF 14-97% and Gr 1 diastolic CHF noted on last ECHO in 2014 - Lasix on hold due to ARF - on Imdur and Hydralazine instead of ACE 1 - cont Coreg  UTI - may be causing he loss of appetite? - cont Rocephin x 3 days- f/u culture  Adult failure to thrive - monitor PO intake- start Ensure  Hypothyroidism - cont Synthroid- f/u TSH and Free T4  Dementia - cont Namenda- decrease Seroquel to QHS only as she is quite sleepy this AM - cont Rispedal in AM   Code Status:     Code Status Orders        Start     Ordered   03/16/15 2012  Do not attempt resuscitation (DNR)    Continuous    Question Answer Comment  In the event of cardiac or respiratory ARREST Do not call a "code blue"   In the event of cardiac or respiratory ARREST Do not perform Intubation, CPR, defibrillation or ACLS   In the event of cardiac or respiratory ARREST Use medication by any route, position, wound care, and other measures to relive pain and suffering. May use oxygen, suction and manual treatment of airway obstruction as needed for comfort.      03/16/15 2013    Advance Directive Documentation        Most Recent Value   Type of Advance Directive  Healthcare Power of [place], Out of facility DNR (pink MOST or yellow form)   Pre-existing out of facility DNR order (yellow form or pink MOST form)  Yellow form placed in chart (order not valid for inpatient use)   "MOST" Form in Place?       Family Communication:   Disposition Plan: d/c in 1-2 days DVT prophylaxis: SCSs Consultants:none Procedures:   Antibiotics: Anti-infectives    Start     Dose/Rate Route Frequency Ordered Stop   03/17/15 1100  cefTRIAXone (ROCEPHIN) 1 g in dextrose 5 % 50 mL IVPB     1 g 100 mL/hr over 30 Minutes Intravenous Every 24 hours 03/17/15 0938     03/16/15 1845  cefTRIAXone (ROCEPHIN) 1 g in dextrose 5 % 50 mL IVPB  1 g 100 mL/hr over 30 Minutes Intravenous  Once 03/16/15 1830 03/16/15 1937      Objective: Filed Weights   03/16/15 2059 03/17/15 0519  Weight: 54.5 kg (120 lb 2.4 oz) 55.1 kg (121 lb 7.6 oz)    Intake/Output Summary (Last 24 hours) at 03/17/15 1012 Last data filed at 03/17/15 1007  Gross per 24 hour  Intake    805 ml  Output      0 ml  Net    805 ml     Vitals Filed Vitals:   03/16/15 2345 03/17/15 0234 03/17/15 0519 03/17/15 0519  BP: 115/48 142/71  121/71  Pulse: 91 93  86  Temp: 98.1 F (36.7 C) 98.2 F (36.8 C)  98.2 F (36.8 C)  TempSrc: Axillary Oral  Oral  Resp: 16 17  16   Height:      Weight:   55.1 kg (121 lb 7.6 oz)   SpO2: 96% 100%  97%     Exam:  General:  Pt is alert, confused to place and time, not in acute distress  HEENT: No icterus, No thrush, oral mucosa moist  Cardiovascular: regular rate and rhythm, S1/S2 No murmur  Respiratory: clear to auscultation bilaterally   Abdomen: Soft, +Bowel sounds, non tender, non distended, no guarding  MSK: No LE edema, cyanosis or clubbing  Data Reviewed: Basic Metabolic Panel:  Recent Labs Lab 03/16/15 1441 03/17/15 0450  NA 138 139  K 5.0 4.1  CL 106 105  CO2 22 26  GLUCOSE 103* 108*  BUN 45* 47*  CREATININE 3.08* 2.97*  CALCIUM 9.7 8.9   Liver Function Tests:  Recent Labs Lab 03/17/15 0450  AST 21  ALT 9*  ALKPHOS 57  BILITOT 1.3*  PROT 6.2*  ALBUMIN 2.7*   No results for input(s): LIPASE, AMYLASE in the last 168 hours. No results for input(s): AMMONIA in the last 168 hours. CBC:  Recent Labs Lab 03/16/15 1857 03/17/15 0450  WBC 3.1* 2.7*  NEUTROABS 2.1 1.9  HGB 7.7* 8.2*  HCT 24.7* 25.8*  MCV 94.3 92.8  PLT 44* 32*   Cardiac Enzymes: No results for input(s): CKTOTAL, CKMB, CKMBINDEX, TROPONINI in the last 168 hours. BNP (last 3 results) No results for input(s): BNP in the last 8760 hours.  ProBNP (last 3 results) No results for input(s): PROBNP in the last 8760 hours.  CBG: No results for input(s): GLUCAP in the last 168 hours.  Recent Results (from the past 240 hour(s))  Urine culture     Status: None (Preliminary result)   Collection Time: 03/16/15  5:27 PM  Result Value Ref Range Status   Specimen Description URINE, RANDOM  Final   Special Requests NONE  Final   Culture CULTURE REINCUBATED FOR BETTER GROWTH  Final   Report Status PENDING  Incomplete     Studies: No results found.  Scheduled Meds:  Scheduled Meds: . cefTRIAXone (ROCEPHIN)  IV  1 g Intravenous Q24H  . feeding supplement (ENSURE ENLIVE)  237 mL Oral BID BM  . fentaNYL  25 mcg Transdermal Q72H  . ferrous gluconate  324 mg Oral Q breakfast  .  levothyroxine  100 mcg Oral QAC breakfast  . memantine  28 mg Oral Daily  . pantoprazole  40 mg Oral BID AC  . polyethylene glycol  17 g Oral Daily  . QUEtiapine  50 mg Oral QHS  . risperiDONE  0.5 mg Oral Daily  . senna-docusate  2 tablet Oral Daily  .  sertraline  100 mg Oral QHS  . sertraline  50 mg Oral QHS   Continuous Infusions:   Time spent on care of this patient: 70 min   [place], MD 03/17/2015, 10:12 AM  LOS: 1 day   [facility] Office  [phone] Pager - Text Page per [email or web address] If 7PM-7AM, please contact night-coverage [email or web address]

## 2015-03-17 NOTE — Progress Notes (Signed)
[place] for adjustment of medication for renal function Indication: acute on chronic kidney disease  Assessment: 81 y/o female admitted with nose bleed. She has acute on chronic kidney disease. Pharmacy consulted to review medications and adjust for renal function.   All current medication are appropriate for renal function. Home medications are also appropriate for renal function although holding some for renal function currently.   Plan:  Continue current regimen, will adjust medications if needed  [facility], [place].D., BCPS Clinical Pharmacist Pager: [phone] 03/17/2015 8:52 AM   Allergies  Allergen Reactions  . Ace Inhibitors Cough  . Requip [Ropinirole Hcl] Other (See Comments)    confusion   Labs:  Recent Labs  03/16/15 1441 03/16/15 1857 03/17/15 0450  WBC  --  3.1* 2.7*  HGB  --  7.7* 8.2*  HCT  --  24.7* 25.8*  PLT  --  44* 32*  CREATININE 3.08*  --  2.97*  ALBUMIN  --   --  2.7*  PROT  --   --  6.2*  AST  --   --  21  ALT  --   --  9*  ALKPHOS  --   --  57  BILITOT  --   --  1.3*   Estimated Creatinine Clearance: 11.4 mL/min (by C-G formula based on Cr of 2.97).   Medical History: Past Medical History  Diagnosis Date  . Atrial fibrillation ([place])   . BRONCHITIS, CHRONIC   . CHEST PAIN   . HYPOKALEMIA   . OBSTRUCTIVE SLEEP APNEA   . Palpitations   . SYSTOLIC HEART FAILURE, CHRONIC   . Depression   . CPAP (continuous positive airway pressure) dependence   . ACE-inhibitor cough   . Cardiac defibrillator in situ   . Chronic renal insufficiency   . GERD (gastroesophageal reflux disease)   . Hypothyroidism   . Hematuria   . Osteoarthritis (arthritis due to wear and tear of joints)   . Colon polyps   . Chronic rhinitis   . RLS (restless legs syndrome)   . Chronic back pain   . Arthritis   . Glaucoma   . Sleep apnea   . CHF (congestive heart failure) ([place])   . Dementia   . H/O colonoscopy 2005   . Anxiety   . Kidney disease   . Thyroid disease   . Generalized weakness   . Falls     Medications:  Scheduled:  . feeding supplement (ENSURE ENLIVE)  237 mL Oral BID BM  . fentaNYL  25 mcg Transdermal Q72H  . ferrous gluconate  324 mg Oral Q breakfast  . levothyroxine  100 mcg Oral QAC breakfast  . memantine  28 mg Oral Daily  . pantoprazole  40 mg Oral BID AC  . polyethylene glycol  17 g Oral Daily  . QUEtiapine  50 mg Oral QHS  . risperiDONE  0.5 mg Oral Daily  . senna-docusate  2 tablet Oral Daily  . sertraline  100 mg Oral QHS  . sertraline  50 mg Oral QHS

## 2015-03-17 NOTE — Clinical Documentation Improvement (Signed)
Hospitalist Please update your documentation within the medical record to reflect your response to this query. Thank you Can the diagnosis of CKD be further specified?   CKD Stage I - GFR greater than or equal to 90  CKD Stage II - GFR 60-89  CKD Stage III - GFR 30-59  CKD Stage IV - GFR 15-29  CKD Stage V - GFR < 15  ESRD (End Stage Renal Disease)  Other condition  Unable to clinically determine  Supporting Information: : (risk factors, signs and symptoms, diagnostics, treatment) 03/16/15 H&P.Marland KitchenMarland Kitchen"Acute on chronic renal failure ([place]);Most likely in the setting of continued use of Lasix and antihypertensive medication with worsening poor oral intake. Patient will be receiving PRBC and we will recheck her BMP in the morning.".Marland KitchenMarland Kitchen Results for KERBY, HOCKLEY (MRN [id number]) as of 03/17/2015 15:04  03/16/2015 14:41 03/17/2015 04:50  EGFR (Non-African Amer.) 13 (L) 13 (L)   Please exercise your independent, professional judgment when responding. A specific answer is not anticipated or expected.  Thank You, Ermelinda Das, RN, BSN, [place] Certified Clinical Documentation Specialist [place]: Health Information Management [phone]

## 2015-03-17 NOTE — Telephone Encounter (Signed)
Patient dtr Asencion Partridge called to cx new patient appointment due to patient in hosp. Also per Nevin Bloodgood they just want to cx - not rescheduling as patient is at a point where they are not going to put her through any further testing, message to HIM.

## 2015-03-17 NOTE — Care Management Note (Signed)
Case Management Note  Patient Details  Name: DULCEY RIEDERER MRN: [id number] Date of Birth: [birth year]  Subjective/Objective:                 Patient admitted from [facility] with uncontrolled nosebleed. Patient received blood transfusion. Anticipate return to ALF, CSW consult placed. Patient was receiving [place] services through [place] at [facility] at discharge from [facility] on 02-24-15.    Action/Plan:  Will continue to monitor and offer resources as needed.  Expected Discharge Date:                  Expected Discharge Plan:  Assisted Living / Rest Home  In-House Referral:  Clinical Social Work  Discharge planning Services  CM Consult  Post Acute Care Choice:    Choice offered to:     DME Arranged:    DME Agency:     HH Arranged:    HH Agency:     Status of Service:  In process, will continue to follow  Medicare Important Message Given:    Date Medicare IM Given:    Medicare IM give by:    Date Additional Medicare IM Given:    Additional Medicare Important Message give by:     If discussed at [place] of Stay Meetings, dates discussed:    Additional Comments:  Carles Collet, RN 03/17/2015, 4:24 PM

## 2015-03-18 ENCOUNTER — Other Ambulatory Visit: Payer: Medicare HMO

## 2015-03-18 ENCOUNTER — Ambulatory Visit: Payer: Medicare HMO | Admitting: Hematology

## 2015-03-18 DIAGNOSIS — I481 Persistent atrial fibrillation: Secondary | ICD-10-CM

## 2015-03-18 LAB — BASIC METABOLIC PANEL
ANION GAP: 11 (ref 5–15)
BUN: 38 mg/dL — ABNORMAL HIGH (ref 6–20)
CALCIUM: 8.9 mg/dL (ref 8.9–10.3)
CHLORIDE: 101 mmol/L (ref 101–111)
CO2: 27 mmol/L (ref 22–32)
Creatinine, Ser: 2.48 mg/dL — ABNORMAL HIGH (ref 0.44–1.00)
GFR calc Af Amer: 19 mL/min — ABNORMAL LOW (ref >60)
GFR calc non Af Amer: 16 mL/min — ABNORMAL LOW (ref >60)
GLUCOSE: 95 mg/dL (ref 65–99)
Potassium: 3.7 mmol/L (ref 3.5–5.1)
Sodium: 139 mmol/L (ref 135–145)

## 2015-03-18 LAB — CBC
HEMATOCRIT: 24.9 % — AB (ref 36.0–46.0)
HEMOGLOBIN: 8 g/dL — AB (ref 12.0–15.0)
MCH: 29.6 pg (ref 26.0–34.0)
MCHC: 32.1 g/dL (ref 30.0–36.0)
MCV: 92.2 fL (ref 78.0–100.0)
Platelets: 37 10*3/uL — ABNORMAL LOW (ref 150–400)
RBC: 2.7 MIL/uL — AB (ref 3.87–5.11)
RDW: 19.3 % — ABNORMAL HIGH (ref 11.5–15.5)
WBC: 1.9 10*3/uL — ABNORMAL LOW (ref 4.0–10.5)

## 2015-03-18 MED ORDER — DEXTROSE 5 % IV SOLN
1.0000 g | INTRAVENOUS | Status: AC
  Administered 2015-03-19: 1 g via INTRAVENOUS
  Filled 2015-03-18: qty 10

## 2015-03-18 MED ORDER — ALPRAZOLAM 0.25 MG PO TABS
0.2500 mg | ORAL_TABLET | Freq: Once | ORAL | Status: AC
  Administered 2015-03-18: 0.25 mg via ORAL
  Filled 2015-03-18: qty 1

## 2015-03-18 MED ORDER — QUETIAPINE FUMARATE 25 MG PO TABS
50.0000 mg | ORAL_TABLET | Freq: Two times a day (BID) | ORAL | Status: DC
  Administered 2015-03-18 – 2015-03-20 (×4): 50 mg via ORAL
  Filled 2015-03-18 (×5): qty 2

## 2015-03-18 NOTE — Evaluation (Signed)
Physical Therapy Evaluation Patient Details Name: [name] [name] MRN: [id number] DOB: 1925/02/11 Today's Date: 03/18/2015   History of Present Illness  Pt adm with uncontrolled nose bleeds and kidney failure. PMH - CVA, CHF  Clinical Impression  Pt admitted with above diagnosis and presents to PT with functional limitations due to deficits listed below (See PT problem list). Pt needs skilled PT to maximize independence and safety to allow discharge to back to ALF.      Follow Up Recommendations Home health PT;Supervision/Assistance - 24 hour    Equipment Recommendations  None recommended by PT    Recommendations for Other Services       Precautions / Restrictions Precautions Precautions: Fall Restrictions Weight Bearing Restrictions: No      Mobility  Bed Mobility Overal bed mobility: Needs Assistance Bed Mobility: Supine to Sit;Sit to Supine     Supine to sit: Max assist Sit to supine: Max assist   General bed mobility comments: Assist to bring legs off bed and elevate trunk into sitting. Assist to bring legs back up in bed and lower trunk.  Transfers                    Ambulation/Gait                Stairs            Wheelchair Mobility    Modified Rankin (Stroke Patients Only)       Balance Overall balance assessment: Needs assistance Sitting-balance support: No upper extremity supported;Feet supported Sitting balance-Leahy Scale: Poor Sitting balance - Comments: Sat EOB x 20 minutes with supervision.                                      Pertinent Vitals/Pain Pain Assessment: No/denies pain    Home Living Family/patient expects to be discharged to:: Assisted living Living Arrangements: Other (Comment) (ALF) Available Help at Discharge: Available 24 hours/day Type of Home: Assisted living Home Access: Level entry     Home Layout: One level Home Equipment: Grab bars - toilet;Wheelchair -  manual Additional Comments: Information from prior chart    Prior Function Level of Independence: Needs assistance   Gait / Transfers Assistance Needed: per daughter, pt had assistance with transfers to wheelchair. Has not ambulated in a long time. Sometimes +2 assist to get into wheelchair with daughter helping CNA.  ADL's / Homemaking Assistance Needed: assisted living for cooking and housekeeping. pt able to feed self after set up. She could assist with grooming. Total assist with bathing, dressing, toileting task. pt would hold to grab bar while CNAs assisted with clothing management, etc at commode.   Comments: Information from prior chart     Hand Dominance   Dominant Hand: Right    Extremity/Trunk Assessment   Upper Extremity Assessment: Generalized weakness           Lower Extremity Assessment: RLE deficits/detail;LLE deficits/detail RLE Deficits / Details: grossly <3/5 LLE Deficits / Details: grossly <3/5  Cervical / Trunk Assessment: Kyphotic  Communication   Communication: No difficulties  Cognition Arousal/Alertness: Awake/alert Behavior During Therapy: Anxious Overall Cognitive Status: History of cognitive impairments - at baseline       Memory: Decreased short-term memory;Decreased recall of precautions              General Comments      Exercises  Assessment/Plan    PT Assessment Patient needs continued PT services  PT Diagnosis Generalized weakness   PT Problem List Decreased strength;Decreased range of motion;Decreased activity tolerance;Decreased balance;Decreased mobility;Decreased cognition;Decreased knowledge of use of DME;Decreased knowledge of precautions;Decreased safety awareness  PT Treatment Interventions DME instruction;Functional mobility training;Therapeutic activities;Therapeutic exercise;Balance training;Patient/family education   PT Goals (Current goals can be found in the Care Plan section) Acute Rehab PT  Goals Patient Stated Goal: none stated.  PT Goal Formulation: Patient unable to participate in goal setting Time For Goal Achievement: 03/08/15 Potential to Achieve Goals: Fair    Frequency Min 2X/week   Barriers to discharge Other (comment) (will need ALF staff to assist all mobiltiy)      Co-evaluation               End of Session   Activity Tolerance: Patient limited by fatigue Patient left: in bed;with call bell/phone within reach;with family/visitor present Nurse Communication: Mobility status         Time: 4239-5320 PT Time Calculation (min) (ACUTE ONLY): 32 min   Charges:   PT Evaluation $Initial PT Evaluation Tier I: 1 Procedure PT Treatments $Therapeutic Activity: 8-22 mins   PT G Codes:        Sander Remedios 25-Mar-2015, 11:11 AM [facility] PT [phone]

## 2015-03-18 NOTE — Clinical Social Work Note (Signed)
Clinical Social Work Assessment  Patient Details  Name: [name] [name] MRN: [id number] Date of Birth: 1924/08/16  Date of referral:  03/18/15               Reason for consult:  Facility Placement                Permission sought to share information with:  Family Supports, Chartered certified accountant granted to share information::   (pt disoriented)  Name::     Designer, television/film set::  [employer]  Relationship::  daughter  Contact Information:     Housing/Transportation Living arrangements for the past 2 months:  [place] of Information:  Adult Children Patient Interpreter Needed:  None Criminal Activity/Legal Involvement Pertinent to Current Situation/Hospitalization:  No - Comment as needed Significant Relationships:  Adult Children Lives with:  Facility Resident Do you feel safe going back to the place where you live?  Yes Need for family participation in patient care:  Yes (Comment) (decision making)  Care giving concerns: none- pt is resident at [facility] Worker assessment / plan:  CSW spoke with pt dtr concerning return to ALF when ready for DC  Employment status:  Retired Nurse, adult PT Recommendations:  Home with [place] / Referral to community resources:     Patient/Family's Response to care:  Pt family agreeable  Patient/Family's Understanding of and Emotional Response to Diagnosis, Current Treatment, and Prognosis:  No questions or concerns at this time  Emotional Assessment Appearance:    Attitude/Demeanor/Rapport:  Unable to Assess Affect (typically observed):  Unable to Assess Orientation:  Oriented to Self Alcohol / Substance use:  Not Applicable Psych involvement (Current and /or in the community):  No (Comment)  Discharge Needs  Concerns to be addressed:  Care Coordination Readmission within the last 30 days:  Yes Current discharge risk:  None Barriers to  Discharge:  Continued Medical Work up   Cranford Mon, LCSW 03/18/2015, 4:27 PM

## 2015-03-18 NOTE — Progress Notes (Signed)
[facility] can accept pt back when ready for DC.  CSW will continue to follow  Domenica Reamer, [place] Social Worker [phone]

## 2015-03-18 NOTE — Progress Notes (Addendum)
[facility] Progress Note   [name] [name] [facility]  DHR:[id number]  DOB: May 12, 1924  DOA: 03/16/2015 PCP: Hollace Kinnier, DO  Brief narrative: [name] [name] is a 81 y.o. female from [place] with h/o myelodysplastic syndrome and pancytopenia, A-fib on ASA, CKD, AICD, systolic CHF, Dementia who presents with epistaxis and is found to have ARF and anemia. Epistaxis was controlled by afrin and DDAVP and has resolved. She is admitted for treatment of renal failure and anemia. Also found to have a positive UA. She admits to a poor PO intake for 1wk.   Subjective: No complaints. Still has a poor appetite. No fever, chills, pain, nausea, vomiting, constipation or diarrhea.   Assessment/Plan: Principal Problem:   Symptomatic anemia - likely due to underlying myelodysplastic syndrome  - has been transfused 1 U PRBC with Hb improving from 7.7 to 8.2  Active Problems: Pancytopenia - stable- related to myelodysplastic syndrome  AKI on CKD 3 - suspecting prerenal etiology but may also be due to ATN- mildly improved today - Lasix on hold-will not start IVF due to severity of heart failure - baseline Cr about 1.4- 1.6  Nose bleed - in setting of thrombocytopenia and ASA use- resolved  Atrial fibrillation - paroxsymal - currently NSR - on ASA 81 mg at home- currently on hold due to nose bleed- cont to hold for 1- 2 more days    Biventricular implantable cardioverter- defibrillator  Chronic systolic congestive heart failure  -EF 46-80% and Gr 1 diastolic CHF noted on last ECHO in 2014 - Lasix on hold due to ARF - on Imdur and Hydralazine instead of ACE I - cont Coreg - follow I and O and daily weights closely  UTI - may be causing he loss of appetite? - cont Rocephin x 3 days- today would be the last dose - culture negative thus far  Adult failure to thrive - monitor PO intake- started Ensure  Hypothyroidism - cont Synthroid- TSH and Free T4  normal  Dementia - cont Namenda, Seroquel and Risperdal   Code Status:     Code Status Orders        Start     Ordered   03/16/15 2012  Do not attempt resuscitation (DNR)   Continuous    Question Answer Comment  In the event of cardiac or respiratory ARREST Do not call a "code blue"   In the event of cardiac or respiratory ARREST Do not perform Intubation, CPR, defibrillation or ACLS   In the event of cardiac or respiratory ARREST Use medication by any route, position, wound care, and other measures to relive pain and suffering. May use oxygen, suction and manual treatment of airway obstruction as needed for comfort.      03/16/15 2013    Advance Directive Documentation        Most Recent Value   Type of Advance Directive  Healthcare Power of [place], Out of facility DNR (pink MOST or yellow form)   Pre-existing out of facility DNR order (yellow form or pink MOST form)  Yellow form placed in chart (order not valid for inpatient use)   "MOST" Form in Place?       Family Communication:  Daughter at bedside Disposition Plan: d/c in 1-2 days based upon improvement in renal function DVT prophylaxis: SCds Consultants:none Procedures:   Antibiotics: Anti-infectives    Start     Dose/Rate Route Frequency Ordered Stop   03/17/15 1100  cefTRIAXone (ROCEPHIN) 1 g in dextrose 5 % 50  mL IVPB     1 g 100 mL/hr over 30 Minutes Intravenous Every 24 hours 03/17/15 0938     03/16/15 1845  cefTRIAXone (ROCEPHIN) 1 g in dextrose 5 % 50 mL IVPB     1 g 100 mL/hr over 30 Minutes Intravenous  Once 03/16/15 1830 03/16/15 1937      Objective: Filed Weights   03/16/15 2059 03/17/15 0519 03/18/15 0600  Weight: 54.5 kg (120 lb 2.4 oz) 55.1 kg (121 lb 7.6 oz) 57 kg (125 lb 10.6 oz)    Intake/Output Summary (Last 24 hours) at 03/18/15 0734 Last data filed at 03/17/15 2145  Gross per 24 hour  Intake   1060 ml  Output      0 ml  Net   1060 ml     Vitals Filed Vitals:   03/17/15 1301  03/17/15 2114 03/18/15 0530 03/18/15 0600  BP: 120/63 121/63 124/64   Pulse: 98 92 92   Temp: 99 F (37.2 C) 98.8 F (37.1 C) 97.7 F (36.5 C)   TempSrc: Oral Oral Oral   Resp: 24 16 16    Height:      Weight:    57 kg (125 lb 10.6 oz)  SpO2:  98% 96%     Exam:  General:  Pt is alert, confused to place and time, not in acute distress  HEENT: No icterus, No thrush, oral mucosa moist  Cardiovascular: regular rate and rhythm, S1/S2 No murmur  Respiratory: clear to auscultation bilaterally   Abdomen: Soft, +Bowel sounds, non tender, non distended, no guarding  MSK: No LE edema, cyanosis or clubbing  Data Reviewed: Basic Metabolic Panel:  Recent Labs Lab 03/16/15 1441 03/17/15 0450 03/18/15 0445  NA 138 139 139  K 5.0 4.1 3.7  CL 106 105 101  CO2 22 26 27   GLUCOSE 103* 108* 95  BUN 45* 47* 38*  CREATININE 3.08* 2.97* 2.48*  CALCIUM 9.7 8.9 8.9   Liver Function Tests:  Recent Labs Lab 03/17/15 0450  AST 21  ALT 9*  ALKPHOS 57  BILITOT 1.3*  PROT 6.2*  ALBUMIN 2.7*   No results for input(s): LIPASE, AMYLASE in the last 168 hours. No results for input(s): AMMONIA in the last 168 hours. CBC:  Recent Labs Lab 03/16/15 1857 03/17/15 0450 03/18/15 0445  WBC 3.1* 2.7* 1.9*  NEUTROABS 2.1 1.9  --   HGB 7.7* 8.2* 8.0*  HCT 24.7* 25.8* 24.9*  MCV 94.3 92.8 92.2  PLT 44* 32* 37*   Cardiac Enzymes: No results for input(s): CKTOTAL, CKMB, CKMBINDEX, TROPONINI in the last 168 hours. BNP (last 3 results) No results for input(s): BNP in the last 8760 hours.  ProBNP (last 3 results) No results for input(s): PROBNP in the last 8760 hours.  CBG: No results for input(s): GLUCAP in the last 168 hours.  Recent Results (from the past 240 hour(s))  Urine culture     Status: None (Preliminary result)   Collection Time: 03/16/15  5:27 PM  Result Value Ref Range Status   Specimen Description URINE, RANDOM  Final   Special Requests NONE  Final   Culture CULTURE  REINCUBATED FOR BETTER GROWTH  Final   Report Status PENDING  Incomplete     Studies: No results found.  Scheduled Meds:  Scheduled Meds: . cefTRIAXone (ROCEPHIN)  IV  1 g Intravenous Q24H  . feeding supplement (ENSURE ENLIVE)  237 mL Oral TID BM  . fentaNYL  25 mcg Transdermal Q72H  . ferrous gluconate  324 mg Oral Q breakfast  . levothyroxine  100 mcg Oral QAC breakfast  . memantine  28 mg Oral Daily  . pantoprazole  40 mg Oral BID AC  . polyethylene glycol  17 g Oral Daily  . QUEtiapine  50 mg Oral QHS  . risperiDONE  0.5 mg Oral Daily  . senna-docusate  2 tablet Oral Daily  . sertraline  100 mg Oral QHS  . sertraline  50 mg Oral QHS   Continuous Infusions:   Time spent on care of this patient: 88 min   [place], MD 03/18/2015, 7:34 AM  LOS: 2 days   [facility] Office  [phone] Pager - Text Page per [email or web address] If 7PM-7AM, please contact night-coverage [email or web address]

## 2015-03-19 DIAGNOSIS — Z7189 Other specified counseling: Secondary | ICD-10-CM

## 2015-03-19 DIAGNOSIS — D649 Anemia, unspecified: Secondary | ICD-10-CM

## 2015-03-19 DIAGNOSIS — Z515 Encounter for palliative care: Secondary | ICD-10-CM

## 2015-03-19 LAB — BASIC METABOLIC PANEL
Anion gap: 9 (ref 5–15)
BUN: 36 mg/dL — AB (ref 6–20)
CO2: 27 mmol/L (ref 22–32)
CREATININE: 2.09 mg/dL — AB (ref 0.44–1.00)
Calcium: 9 mg/dL (ref 8.9–10.3)
Chloride: 102 mmol/L (ref 101–111)
GFR calc Af Amer: 23 mL/min — ABNORMAL LOW (ref >60)
GFR, EST NON AFRICAN AMERICAN: 20 mL/min — AB (ref >60)
GLUCOSE: 120 mg/dL — AB (ref 65–99)
Potassium: 3.6 mmol/L (ref 3.5–5.1)
SODIUM: 138 mmol/L (ref 135–145)

## 2015-03-19 LAB — CBC
HCT: 28.2 % — ABNORMAL LOW (ref 36.0–46.0)
Hemoglobin: 9.1 g/dL — ABNORMAL LOW (ref 12.0–15.0)
MCH: 29.8 pg (ref 26.0–34.0)
MCHC: 32.3 g/dL (ref 30.0–36.0)
MCV: 92.5 fL (ref 78.0–100.0)
PLATELETS: 38 10*3/uL — AB (ref 150–400)
RBC: 3.05 MIL/uL — ABNORMAL LOW (ref 3.87–5.11)
RDW: 18.3 % — AB (ref 11.5–15.5)
WBC: 2.2 10*3/uL — AB (ref 4.0–10.5)

## 2015-03-19 LAB — URINE CULTURE

## 2015-03-19 MED ORDER — SODIUM CHLORIDE 0.9 % IV SOLN
INTRAVENOUS | Status: DC
  Administered 2015-03-19 – 2015-03-20 (×2): via INTRAVENOUS

## 2015-03-19 NOTE — Care Management Important Message (Signed)
Important Message  Patient Details  Name: [name] [name] MRN: [id number] Date of Birth: [birth year]   Medicare Important Message Given:  Yes-second notification given    Nathen May 03/19/2015, 9:49 AM

## 2015-03-19 NOTE — Consult Note (Signed)
Consultation Note Date: 03/19/2015   Patient Name: [name] [name] [id number]  DOB: 1924-11-17  MRN: [id number]  Age / Sex: 81 y.o., female  PCP: Gayland Curry, DO Referring Physician: Velvet Bathe, MD  Reason for Consultation: Establishing goals of care    Clinical Assessment/Narrative: [name] [name] is a 81 y.o. female from [facility] with h/o myelodysplastic syndrome and pancytopenia, A-fib on ASA, CKD, AICD, systolic CHF, Dementia who presents with epistaxis and is found to have ARF and anemia. Epistaxis was controlled by afrin and DDAVP and has resolved. She is admitted for treatment of renal failure and anemia. Also found to have a positive UA. Palliative consulted for goals of care in light of acute worsening of her condition concurrent with her chronic disease processes including dementia.  I met with [name] family to discuss her care plan moving forward. Her daughters report that she has always placed an emphasis on her family, but this is something that has continued to change with her advancing dementia. She is currently living in an assisted living facility that she calls home. Her daughters report that overall they believe she is happy there and this is where they would like for her to return.  Her daughters report her doctors have been doing a good job explaining things to her including advancement of her dementia as well as her heart failure and myelodysplastic syndrome. They are in agreement that if she were to understand her current clinical situation that her wishes would be to focus on interventions that would allow her to remain at the assisted living facility and be comfortable.  We discussed the chronic nature of her underlying problems as well as loss of cognition, functional status, nutrition over the last several months. They have a good understanding that she is reaching the end of  her life and are interested in pursuing whatever pathway of care allow her to be as comfortable as possible and retain as much dignity as possible. Her daughter has been in contact with the assisted living facility where she is currently residing and they report being able to manage her care and have a hospice organization with whom they closely work to provide care for the residents. Both of her daughters are in agreement that this would be the best plan of care moving forward.  Contacts/Participants in Discussion: Patient's 2 daughters, including Asencion Partridge who is her POA Primary Decision Maker: Asencion Partridge    Relationship to Patient daughter/POA HCPOA: None on chart  SUMMARY OF RECOMMENDATIONS - Family would like to pursue placement back at [facility] assisted living with hospice support. Her daughter has spoken with the director at the facility and they report this is a service they are comfortable they can offer to patient. - Goal is to medically maximize her this visit with plan to enroll in hospice on discharge with no further hospitalizations. - Order placed to case management for referral for hospice at assisted living facility - We also discussed end-of-life care and family is in agreement with deactivating AICD. Order was placed for this deactivation as well  Code Status/Advance Care Planning: DNR    Code Status Orders        Start     Ordered   03/16/15 2012  Do not attempt resuscitation (DNR)   Continuous    Question Answer Comment  In the event of cardiac or respiratory ARREST Do not call a "code blue"   In the event of cardiac or respiratory ARREST Do  not perform Intubation, CPR, defibrillation or ACLS   In the event of cardiac or respiratory ARREST Use medication by any route, position, wound care, and other measures to relive pain and suffering. May use oxygen, suction and manual treatment of airway obstruction as needed for comfort.      03/16/15 2013    Advance  Directive Documentation        Most Recent Value   Type of Advance Directive  Healthcare Power of [place], Out of facility DNR (pink MOST or yellow form)   Pre-existing out of facility DNR order (yellow form or pink MOST form)  Yellow form placed in chart (order not valid for inpatient use)   "MOST" Form in Place?        Other Directives:Advanced Directive  Symptom Management:   Patient with what appears to be episodic delirium. Likely multifactorial in nature with major contribution from underlying dementia.  She is sleeping at time of exam and did not wake patient per family request. Will continue with standard delirium precautions while modifying any possible risk factors, such as resolution of constipation which was accomplished via enema today.  Palliative Prophylaxis:   Aspiration, Bowel Regimen, Delirium Protocol and Turn Reposition  Additional Recommendations (Limitations, Scope, Preferences):  Avoid Hospitalization  Psycho-social/Spiritual:  Support System: Strong Desire for further Chaplaincy support:{None Additional Recommendations: Education on Hospice  Prognosis: < 3 months  Discharge Planning: [facility] with hospice support  Chief Complaint/ Primary Diagnoses: Present on Admission:  . Symptomatic anemia . Atrial fibrillation ([place]) . Biventricular implantable cardioverter-defibrillator LV lead off . Chronic systolic congestive heart failure ([place]) . Adult failure to thrive . Hypothyroidism . Acute on chronic renal failure ([place]) . Dementia . Pancytopenia ([place]) . Epistaxis  I have reviewed the medical record, interviewed the patient and family, and examined the patient. The following aspects are pertinent.  Past Medical History  Diagnosis Date  . Atrial fibrillation ([place])   . BRONCHITIS, CHRONIC   . CHEST PAIN   . HYPOKALEMIA   . OBSTRUCTIVE SLEEP APNEA   . Palpitations   . SYSTOLIC HEART FAILURE, CHRONIC   . Depression   . CPAP  (continuous positive airway pressure) dependence   . ACE-inhibitor cough   . Cardiac defibrillator in situ   . Chronic renal insufficiency   . GERD (gastroesophageal reflux disease)   . Hypothyroidism   . Hematuria   . Osteoarthritis (arthritis due to wear and tear of joints)   . Colon polyps   . Chronic rhinitis   . RLS (restless legs syndrome)   . Chronic back pain   . Arthritis   . Glaucoma   . Sleep apnea   . CHF (congestive heart failure) ([place])   . Dementia   . H/O colonoscopy 2005  . Anxiety   . Kidney disease   . Thyroid disease   . Generalized weakness   . Falls    Social History   Social History  . Marital Status: Widowed    Spouse Name: N/A  . Number of Children: N/A  . Years of Education: N/A   Social History Main Topics  . Smoking status: Former Smoker -- 1.00 packs/day    Types: Cigarettes    Quit date: 04/10/2003  . Smokeless tobacco: Never Used  . Alcohol Use: No  . Drug Use: No  . Sexual Activity: Not Asked   Other Topics Concern  . None   Social History Narrative   Family History  Problem Relation Age of  Onset  . Heart disease Mother   . Colon cancer Mother   . Colon polyps Mother   . Kidney disease Mother   . Emphysema Father   . Liver disease Brother   . Kidney disease Maternal Grandmother   . Breast cancer Maternal Aunt   . Heart disease Father    Scheduled Meds: . feeding supplement (ENSURE ENLIVE)  237 mL Oral TID BM  . fentaNYL  25 mcg Transdermal Q72H  . ferrous gluconate  324 mg Oral Q breakfast  . levothyroxine  100 mcg Oral QAC breakfast  . memantine  28 mg Oral Daily  . pantoprazole  40 mg Oral BID AC  . polyethylene glycol  17 g Oral Daily  . QUEtiapine  50 mg Oral BID  . risperiDONE  0.5 mg Oral Daily  . senna-docusate  2 tablet Oral Daily  . sertraline  100 mg Oral QHS  . sertraline  50 mg Oral QHS   Continuous Infusions: . sodium chloride 50 mL/hr at 03/19/15 1536   PRN Meds:.acetaminophen **OR** acetaminophen,  ondansetron **OR** ondansetron (ZOFRAN) IV Medications Prior to Admission:  Prior to Admission medications   Medication Sig Start Date End Date Taking? Authorizing Provider  allopurinol (ZYLOPRIM) 100 MG tablet Take 1 tablet by mouth daily for Gout 02/18/14  Yes Lauree Chandler, NP  aspirin EC 81 MG tablet Take 1 tablet (81 mg total) by mouth daily. 10/14/11  Yes Deboraha Sprang, MD  barrier cream (NON-SPECIFIED) CREA Apply 1 application topically daily.   Yes Historical Provider, MD  Calcium Carbonate-Vitamin D (CALTRATE 600+D) 600-400 MG-UNIT per tablet Take one tablet by mouth four times daily with meals 11/19/14  Yes Lauree Chandler, NP  carvedilol (COREG) 12.5 MG tablet Take 1/2 tablet by mouth twice daily Patient taking differently: Take 6.25 mg by mouth 2 (two) times daily with a meal.  02/18/14  Yes Lauree Chandler, NP  docusate sodium (COLACE) 100 MG capsule Take 1 capsule (100 mg total) by mouth 2 (two) times daily. 03/13/15  Yes [name] L Reed, DO  fentaNYL (DURAGESIC - DOSED MCG/HR) 25 MCG/HR patch Place 1 patch onto the skin every 3 days for pain 03/07/15  Yes [name] L Reed, DO  ferrous gluconate (FERGON) 324 MG tablet Take 1 tablet (324 mg total) by mouth daily with breakfast. 03/13/15  Yes [name] L Reed, DO  furosemide (LASIX) 40 MG tablet Take 1 tablet (40 mg total) by mouth daily. 02/27/15  Yes Ripudeep Krystal Eaton, MD  hydrALAZINE (APRESOLINE) 10 MG tablet Take 1 tablet by mouth 3 times daily, breakfast, 2:00 pm and bedtime for BP 02/18/14  Yes Lauree Chandler, NP  HYDROcodone-acetaminophen (NORCO/VICODIN) 5-325 MG tablet Take 1 tablet by mouth every 6 (six) hours as needed for moderate pain. Take one tablet by mouth every 6 hours as needed for leg pain 03/07/15  Yes [name] L Reed, DO  hydroxypropyl methylcellulose / hypromellose (ISOPTO TEARS / GONIOVISC) 2.5 % ophthalmic solution Place 1 drop into both eyes 3 (three) times daily.   Yes Historical Provider, MD  isosorbide  mononitrate (IMDUR) 30 MG 24 hr tablet Take 1 tablet by mouth daily for BP 02/18/14  Yes Lauree Chandler, NP  levothyroxine (SYNTHROID, LEVOTHROID) 100 MCG tablet TAKE 1 TABLET BY MOUTH 30 MIN. BEFORE BREAKFAST DAILY 03/10/15  Yes [name] L Reed, DO  NAMENDA XR 28 MG CP24 24 hr capsule TAKE ONE TABLET BY MOUTH ONCE DAILY FOR MEMORY 03/10/15  Yes [place],  DO  nystatin (MYCOSTATIN/NYSTOP) 100000 UNIT/GM POWD Apply 1 g topically daily.   Yes Historical Provider, MD  pantoprazole (PROTONIX) 40 MG tablet Take 1 tablet by mouth 2 times daily before a meal for GERD 05/16/14  Yes [name] L Reed, DO  polyethylene glycol (MIRALAX / GLYCOLAX) packet Take 17 g by mouth daily. For constipation. Mix with Cranberry juice in the morning 02/10/15  Yes [name] L Reed, DO  potassium chloride SA (KLOR-CON M20) 20 MEQ tablet Take two tablets by mouth twice daily for hypokalemia 03/03/15  Yes [name] L Reed, DO  QUEtiapine (SEROQUEL) 50 MG tablet Take one tablet at 3:00 pm and one tablet at bedtime Patient taking differently: Take 50 mg by mouth 2 (two) times daily. Take one tablet at 3:00 pm and one tablet at bedtime 03/07/15  Yes [name] L Reed, DO  risperiDONE (RISPERDAL) 0.5 MG tablet TAKE ONE TABLET BY MOUTH EVERY 6 HOURS AS NEEDED FOR AGITATION. 04/15/14  Yes [name] L Reed, DO  senna-docusate (SENOKOT-S) 8.6-50 MG per tablet Take 2 tablets by mouth daily. Hold for loose stools 03/21/14  Yes [name] L Reed, DO  sertraline (ZOLOFT) 100 MG tablet Take one $Remove'100mg'dOOPonW$  tablet with $RemoveBef'50mg'WCsvsTzyro$  tablet = $Remove'150mg'rgIiIuC$  once daily 03/07/15  Yes [name] L Reed, DO  sertraline (ZOLOFT) 50 MG tablet Take one $Remove'50mg'NflEntA$  tablet with $RemoveBef'100mg'MeFvdYfBng$  tablet = $Remove'150mg'skCiNlE$  once daily 03/07/15  Yes [name] L Reed, DO  Vitamin D, Ergocalciferol, (DRISDOL) 50000 UNITS CAPS capsule Take 50,000 Units by mouth every 7 (seven) days. Wednesday   Yes Historical Provider, MD  AMBULATORY NON FORMULARY MEDICATION Wheelchair and Cushion. Patient is unable to propel herself-she is  nonambulatory 05/28/14   [name] L Reed, DO  feeding supplement, ENSURE ENLIVE, (ENSURE ENLIVE) LIQD Take 237 mLs by mouth 2 (two) times daily between meals. 02/24/15   Ripudeep Krystal Eaton, MD  NAMENDA XR 28 MG CP24 24 hr capsule TAKE ONE TABLET BY MOUTH ONCE DAILY FOR MEMORY 03/17/15   [name] L Reed, DO   Allergies  Allergen Reactions  . Ace Inhibitors Cough  . Requip [Ropinirole Hcl] Other (See Comments)    confusion    Review of Systems  Unable to perform ROS: Dementia   patient currently sleeping and family asked me not to wake her up   Physical Exam  Gen.: Patient sleeping and did not arouse on entering the room. Her family reports that she has had difficulty sleeping and asked me to let her sleep this evening. Respiratory: Regular rate and depth of inspiration. No distress noted  Vital Signs: BP 122/60 mmHg  Pulse 90  Temp(Src) 98.5 F (36.9 C) (Oral)  Resp 16  Ht 5' 4.8" (1.646 m)  Wt 58 kg (127 lb 13.9 oz)  BMI 21.41 kg/m2  SpO2 98%  SpO2: SpO2: 98 % O2 Device:SpO2: 98 % O2 Flow Rate: .   IO: Intake/output summary:  Intake/Output Summary (Last 24 hours) at 03/19/15 1931 Last data filed at 03/19/15 1846  Gross per 24 hour  Intake 518.33 ml  Output      0 ml  Net 518.33 ml    LBM: Last BM Date: 03/18/15 Baseline Weight: Weight: 54.5 kg (120 lb 2.4 oz) Most recent weight: Weight: 58 kg (127 lb 13.9 oz)      Palliative Assessment/Data:    Additional Data Reviewed:  CBC:    Component Value Date/Time   WBC 2.2* 03/19/2015 1021   WBC 5.1 10/24/2014 1500   HGB 9.1* 03/19/2015 1021   HCT 28.2*  03/19/2015 1021   HCT 27.1* 10/24/2014 1500   PLT 38* 03/19/2015 1021   MCV 92.5 03/19/2015 1021   NEUTROABS 1.9 03/17/2015 0450   NEUTROABS 3.5 10/24/2014 1500   LYMPHSABS 0.7 03/17/2015 0450   LYMPHSABS 1.1 10/24/2014 1500   MONOABS 0.1 03/17/2015 0450   EOSABS 0.0 03/17/2015 0450   EOSABS 0.3 03/21/2014 1008   BASOSABS 0.0 03/17/2015 0450   BASOSABS 0.0  10/24/2014 1500   Comprehensive Metabolic Panel:    Component Value Date/Time   NA 138 03/19/2015 0632   NA 141 10/24/2014 1500   K 3.6 03/19/2015 0632   CL 102 03/19/2015 0632   CO2 27 03/19/2015 0632   BUN 36* 03/19/2015 0632   BUN 25 10/24/2014 1500   CREATININE 2.09* 03/19/2015 0632   GLUCOSE 120* 03/19/2015 0632   GLUCOSE 97 10/24/2014 1500   CALCIUM 9.0 03/19/2015 0632   AST 21 03/17/2015 0450   ALT 9* 03/17/2015 0450   ALKPHOS 57 03/17/2015 0450   BILITOT 1.3* 03/17/2015 0450   BILITOT 0.4 10/24/2014 1500   PROT 6.2* 03/17/2015 0450   PROT 6.6 10/24/2014 1500   ALBUMIN 2.7* 03/17/2015 0450   ALBUMIN 4.0 10/24/2014 1500     Time In: 1725 Time Out: 1855 Time Total: 90 Greater than 50%  of this time was spent counseling and coordinating care related to the above assessment and plan.  Signed by: Micheline Rough, MD  Micheline Rough, MD  03/19/2015, 7:31 PM  Please contact Palliative Medicine Team phone at [phone] for questions and concerns.           e

## 2015-03-19 NOTE — Progress Notes (Signed)
Enema given to patient. Patient relieved of two large, hard bowel movements, roughly the size (slighly smaller) than a baseball. Patient cleaned and repositioned. Resting comfortable at this time. Will continue to monitor.

## 2015-03-19 NOTE — Progress Notes (Signed)
[facility] Progress Note   [name] [name] [facility]  CHE:[id number]  DOB: [birth year]  DOA: 03/16/2015 PCP: [name] Kinnier, DO  Brief narrative: [name] [name] is a 81 y.o. female from [facility] with h/o myelodysplastic syndrome and pancytopenia, A-fib on ASA, CKD, AICD, systolic CHF, Dementia who presents with epistaxis and is found to have ARF and anemia. Epistaxis was controlled by afrin and DDAVP and has resolved. She is admitted for treatment of renal failure and anemia. Also found to have a positive UA.    Subjective: Daughter reports poor oral intake also reports continued decline in mother's health with time. She is interested in discussion with palliative care team for comfort care measures.  Assessment/Plan: Principal Problem:   Symptomatic anemia - likely due to underlying myelodysplastic syndrome  - has been transfused 1 U PRBC with Hb improving from 7.7 to 8.2  Active Problems: Pancytopenia - stable- related to myelodysplastic syndrome  AKI on CKD 3 - suspecting prerenal etiology but may also be due to ATN- mildly improved today - Lasix on hold-will gently hydrate - baseline Cr about 1.4- 1.6  Nose bleed - in setting of thrombocytopenia and ASA use- resolved  Atrial fibrillation - paroxsymal - currently NSR - on ASA 81 mg at home- currently on hold due to nose bleed- cont to hold for 1- 2 more days    Biventricular implantable cardioverter- defibrillator  Chronic systolic congestive heart failure  -EF 53-61% and Gr 1 diastolic CHF noted on last ECHO in 2014 - Lasix on hold due to ARF - on Imdur and Hydralazine instead of ACE I - cont Coreg - follow I and O and daily weights closely  UTI - completed 3 days of antibiotics - culture negative thus far  Adult failure to thrive - monitor PO intake- started Ensure  Hypothyroidism - cont Synthroid- TSH and Free T4 normal  Dementia - cont Namenda, Seroquel and Risperdal   Code Status:     Code Status Orders        Start     Ordered   03/16/15 2012  Do not attempt resuscitation (DNR)   Continuous    Question Answer Comment  In the event of cardiac or respiratory ARREST Do not call a "code blue"   In the event of cardiac or respiratory ARREST Do not perform Intubation, CPR, defibrillation or ACLS   In the event of cardiac or respiratory ARREST Use medication by any route, position, wound care, and other measures to relive pain and suffering. May use oxygen, suction and manual treatment of airway obstruction as needed for comfort.      03/16/15 2013    Advance Directive Documentation        Most Recent Value   Type of Advance Directive  Healthcare Power of [place], Out of facility DNR (pink MOST or yellow form)   Pre-existing out of facility DNR order (yellow form or pink MOST form)  Yellow form placed in chart (order not valid for inpatient use)   "MOST" Form in Place?       Family Communication:  Daughter at bedside Disposition Plan: d/c in 1-2 days based upon improvement in renal function DVT prophylaxis: SCds Consultants:none Procedures:   Antibiotics: Anti-infectives    Start     Dose/Rate Route Frequency Ordered Stop   03/19/15 1100  cefTRIAXone (ROCEPHIN) 1 g in dextrose 5 % 50 mL IVPB     1 g 100 mL/hr over 30 Minutes Intravenous Every 24 hours 03/18/15 1111 03/19/15  1036   03/17/15 1100  cefTRIAXone (ROCEPHIN) 1 g in dextrose 5 % 50 mL IVPB  Status:  Discontinued     1 g 100 mL/hr over 30 Minutes Intravenous Every 24 hours 03/17/15 0938 03/18/15 1111   03/16/15 1845  cefTRIAXone (ROCEPHIN) 1 g in dextrose 5 % 50 mL IVPB     1 g 100 mL/hr over 30 Minutes Intravenous  Once 03/16/15 1830 03/16/15 1937      Objective: Filed Weights   03/17/15 0519 03/18/15 0600 03/19/15 0407  Weight: 55.1 kg (121 lb 7.6 oz) 57 kg (125 lb 10.6 oz) 58 kg (127 lb 13.9 oz)    Intake/Output Summary (Last 24 hours) at 03/19/15 1449 Last data filed at 03/19/15 0910   Gross per 24 hour  Intake    360 ml  Output      0 ml  Net    360 ml     Vitals Filed Vitals:   03/18/15 0600 03/18/15 1404 03/18/15 2212 03/19/15 0407  BP:  119/60 120/55   Pulse:  102 93   Temp:  98.4 F (36.9 C) 98.1 F (36.7 C)   TempSrc:  Oral Oral   Resp:  18 18   Height:      Weight: 57 kg (125 lb 10.6 oz)   58 kg (127 lb 13.9 oz)  SpO2:  100% 96%     Exam:  General:  Pt is alert, confused to place and time, not in acute distress  HEENT: No icterus, No thrush on limited exam, oral mucosa moist  Cardiovascular: no cyanosis  Respiratory: no increased wob, no wheezes  Abdomen: Soft, +Bowel sounds, non tender, non distended, no guarding  MSK: No LE edema, cyanosis or clubbing  Data Reviewed: Basic Metabolic Panel:  Recent Labs Lab 03/16/15 1441 03/17/15 0450 03/18/15 0445 03/19/15 0632  NA 138 139 139 138  K 5.0 4.1 3.7 3.6  CL 106 105 101 102  CO2 22 26 27 27   GLUCOSE 103* 108* 95 120*  BUN 45* 47* 38* 36*  CREATININE 3.08* 2.97* 2.48* 2.09*  CALCIUM 9.7 8.9 8.9 9.0   Liver Function Tests:  Recent Labs Lab 03/17/15 0450  AST 21  ALT 9*  ALKPHOS 57  BILITOT 1.3*  PROT 6.2*  ALBUMIN 2.7*   No results for input(s): LIPASE, AMYLASE in the last 168 hours. No results for input(s): AMMONIA in the last 168 hours. CBC:  Recent Labs Lab 03/16/15 1857 03/17/15 0450 03/18/15 0445 03/19/15 1021  WBC 3.1* 2.7* 1.9* 2.2*  NEUTROABS 2.1 1.9  --   --   HGB 7.7* 8.2* 8.0* 9.1*  HCT 24.7* 25.8* 24.9* 28.2*  MCV 94.3 92.8 92.2 92.5  PLT 44* 32* 37* 38*   Cardiac Enzymes: No results for input(s): CKTOTAL, CKMB, CKMBINDEX, TROPONINI in the last 168 hours. BNP (last 3 results) No results for input(s): BNP in the last 8760 hours.  ProBNP (last 3 results) No results for input(s): PROBNP in the last 8760 hours.  CBG: No results for input(s): GLUCAP in the last 168 hours.  Recent Results (from the past 240 hour(s))  Urine culture     Status: None    Collection Time: 03/16/15  5:27 PM  Result Value Ref Range Status   Specimen Description URINE, RANDOM  Final   Special Requests NONE  Final   Culture MULTIPLE SPECIES PRESENT, SUGGEST RECOLLECTION  Final   Report Status 03/19/2015 FINAL  Final     Studies: No results found.  Scheduled Meds:  Scheduled Meds: . feeding supplement (ENSURE ENLIVE)  237 mL Oral TID BM  . fentaNYL  25 mcg Transdermal Q72H  . ferrous gluconate  324 mg Oral Q breakfast  . levothyroxine  100 mcg Oral QAC breakfast  . memantine  28 mg Oral Daily  . pantoprazole  40 mg Oral BID AC  . polyethylene glycol  17 g Oral Daily  . QUEtiapine  50 mg Oral BID  . risperiDONE  0.5 mg Oral Daily  . senna-docusate  2 tablet Oral Daily  . sertraline  100 mg Oral QHS  . sertraline  50 mg Oral QHS   Continuous Infusions:   Time spent on care of this patient: 35 min   Velvet Bathe, MD 03/19/2015, 2:49 PM  LOS: 3 days   [facility] Office  [phone] Pager - Text Page per [email or web address] If 7PM-7AM, please contact night-coverage [email or web address]

## 2015-03-19 NOTE — Care Management Note (Addendum)
Case Management Note  Patient Details  Name: [name] [name] MRN: [id number] Date of Birth: 15-Nov-1924  Subjective/Objective:                  Patient admitted from [facility] ALF with uncontrolled nosebleed. Patient received blood transfusion. Anticipate return to ALF, CSW consult placed. Patient was receiving [place] services through [place] at [facility] at discharge from [facility] on 02-24-15.    Action/Plan:  03-20-15 Referral made to hospice and palliative care of [place], contracted facility with [place]. Patient will require [facility] transport. CSW notified of transportation needs.   Expected Discharge Date:                  Expected Discharge Plan:  Assisted Living / Rest Home  In-House Referral:  Clinical Social Work  Discharge planning Services  CM Consult  Post Acute Care Choice:    Choice offered to:     DME Arranged:    DME Agency:     HH Arranged:  RN, PT HH Agency:  [facility]  Status of Service:  Completed, signed off  Medicare Important Message Given:  Yes-second notification given Date Medicare IM Given:    Medicare IM give by:    Date Additional Medicare IM Given:    Additional Medicare Important Message give by:     If discussed at [place] of Stay Meetings, dates discussed:    Additional Comments:  Carles Collet, RN 03/19/2015, 11:20 AM

## 2015-03-19 NOTE — Care Management Important Message (Signed)
Important Message  Patient Details  Name: [name] [name] MRN: [id number] Date of Birth: 11-07-1924   Medicare Important Message Given:  Yes-second notification given    Delorse Lek 03/19/2015, 2:03 PM

## 2015-03-20 ENCOUNTER — Telehealth: Payer: Self-pay

## 2015-03-20 ENCOUNTER — Ambulatory Visit: Payer: Self-pay | Admitting: Internal Medicine

## 2015-03-20 MED ORDER — FENTANYL 25 MCG/HR TD PT72: MEDICATED_PATCH | TRANSDERMAL | Status: AC

## 2015-03-20 MED ORDER — ENSURE ENLIVE PO LIQD: 237.0000 mL | Freq: Three times a day (TID) | ORAL | Status: AC

## 2015-03-20 MED ORDER — CARVEDILOL 12.5 MG PO TABS: 6.2500 mg | ORAL_TABLET | Freq: Two times a day (BID) | ORAL | Status: AC

## 2015-03-20 NOTE — Progress Notes (Signed)
Daily Progress Note   Patient Name: [name] [name] [id number]       Date: 03/20/2015 DOB: 1925-02-08  Age: 81 y.o. MRN#: [id number] Attending Physician: Velvet Bathe, MD Primary Care Physician: Hollace Kinnier, DO Admit Date: 03/16/2015  Reason for Consultation/Follow-up: Disposition and Establishing goals of care  Subjective: [name] [name] is a 81 y.o. female from [place] with h/o myelodysplastic syndrome and pancytopenia, A-fib on ASA, CKD, AICD, systolic CHF, Dementia who presents with epistaxis and is found to have ARF and anemia. Epistaxis was controlled by afrin and DDAVP and has resolved. She is admitted for treatment of renal failure and anemia. Also found to have a positive UA. Palliative consulted for goals of care in light of acute worsening of her condition concurrent with her chronic disease processes including dementia.  Met with family last evening and goal moving forward is to focus on quality of life with hospice support at her ALF.  Interval Events: I met with Ms. Wayson this AM.  She was sitting in bed eating breakfast.  Reports that she is feeling better this morning.  Knows she is not at her home [employer]), but she is not sure exactly where she is or why she is here.  States that she would like to go home.  No insight into her current medical problems during encounter.  She denies any other concerns this AM.  Length of Stay: 4 days  Current Medications: Scheduled Meds:  . feeding supplement (ENSURE ENLIVE)  237 mL Oral TID BM  . fentaNYL  25 mcg Transdermal Q72H  . ferrous gluconate  324 mg Oral Q breakfast  . levothyroxine  100 mcg Oral QAC breakfast  . memantine  28 mg Oral Daily  . pantoprazole  40 mg Oral BID AC  . polyethylene glycol  17 g Oral Daily  . QUEtiapine  50 mg Oral BID  . risperiDONE  0.5 mg Oral Daily  . senna-docusate  2 tablet Oral Daily  . sertraline  100 mg Oral QHS  . sertraline  50 mg Oral QHS    Continuous  Infusions: . sodium chloride 50 mL/hr at 03/19/15 1536    PRN Meds: acetaminophen **OR** acetaminophen, ondansetron **OR** ondansetron (ZOFRAN) IV  Palliative Performance Scale: 30-40%     Vital Signs: BP 144/68 mmHg  Pulse 80  Temp(Src) 97.6 F (36.4 C) (Oral)  Resp 20  Ht 5' 4.8" (1.646 m)  Wt 53.9 kg (118 lb 13.3 oz)  BMI 19.89 kg/m2  SpO2 95% SpO2: SpO2: 95 % O2 Device: O2 Device: Not Delivered O2 Flow Rate:    Intake/output summary:  Intake/Output Summary (Last 24 hours) at 03/20/15 0914 Last data filed at 03/20/15 0500  Gross per 24 hour  Intake 258.33 ml  Output      0 ml  Net 258.33 ml   LBM:   Baseline Weight: Weight: 54.5 kg (120 lb 2.4 oz) Most recent weight: Weight: 53.9 kg (118 lb 13.3 oz)  Physical Exam: General: Alert, awake, in no acute distress. Sitting in bed eating breakfast. Pleasantly confused. Heart: Regular rate and rhythm. No murmur appreciated. Lungs: Fair air movement, clear Abdomen: Soft, nontender, nondistended, positive bowel sounds.  Ext: No significant edema Skin: Warm and dry Neuro: Grossly intact, nonfocal.            Additional Data Reviewed: Recent Labs     03/18/15  0445  03/19/15  0632  03/19/15  1021  WBC  1.9*   --  2.2*  HGB  8.0*   --   9.1*  PLT  37*   --   38*  NA  139  138   --   BUN  38*  36*   --   CREATININE  2.48*  2.09*   --      Problem List:  Patient Active Problem List   Diagnosis Date Noted  . Symptomatic anemia 03/16/2015  . Epistaxis 03/16/2015  . SIRS (systemic inflammatory response syndrome) ([place]) 02/22/2015  . Hypernatremia 02/22/2015  . Acute kidney injury ([place]) 02/22/2015  . Pancytopenia ([place]) 02/22/2015  . UTI (lower urinary tract infection) 02/21/2015  . Acute on chronic renal failure ([place]) 02/21/2015  . Dehydration 02/21/2015  . Sepsis secondary to UTI ([place]) 02/21/2015  . Encephalopathy acute 02/21/2015  . Dementia 02/21/2015  . Loss of weight 06/20/2014  . Hiatal hernia with  obstruction but no gangrene 06/20/2014  . Unintended weight loss 01/17/2014  . Hypothyroidism due to acquired atrophy of thyroid 12/06/2013  . Hypothyroidism 09/28/2013  . Chronic kidney disease (CKD), stage III (moderate) 08/20/2013  . Gout 08/20/2013  . Mixed vascular and neurodegenerative dementia 08/20/2013  . Broken ribs 07/17/2013  . Adult failure to thrive 04/16/2013  . Chronic systolic congestive heart failure ([place]) 10/24/2012  . Renal failure 10/14/2011  . Dysphagia 03/04/2011  . Biventricular implantable cardioverter-defibrillator LV lead off 09/15/2010  . CHEST PAIN 07/01/2009  . left ventricular lead-diaphragmatic stimulation-inactivated 06/03/2009  . PALPITATIONS 04/18/2009  . HYPOKALEMIA 03/20/2009  . Atrial fibrillation ([place]) 03/14/2009  . RHINITIS 08/01/2007  . BRONCHITIS, CHRONIC 08/01/2007  . OBSTRUCTIVE SLEEP APNEA 04/20/2007  . RESTLESS LEG SYNDROME 04/20/2007     Palliative Care Assessment & Plan    Code Status:  DNR  Goals of Care:  Met with family last evening and goal moving forward is to focus on quality of life for   Symptom Management:  Patient with reports of episodic delirium this admission.  She is pleasantly confused this morning and her mental status has improved following resolution of her constipation.  Her delirium is likely multifactorial in nature with major contribution from underlying dementia. Will continue with standard delirium precautions while modifying any possible risk factors.  Palliative Prophylaxis:  Aspiration, Bowel Regimen, Delirium Protocol and Turn Reposition  Psycho-social/Spiritual: Desire for further Chaplaincy support: No  Prognosis: Likely weeks to months. I believe she qualifies for hospice support with expected prognosis of <6 months.  This is based upon multiple comorbid conditions including myelodysplastic syndrome with pancytopenia (no plans for further workup per family), chronic anemia, systolic HF with  EF 03%, and dementia with recurrent hospitalization, decreased intake, and documented weight loss of >9% in the last month.  Discharge Planning: [facility] with hospice support   Care plan was discussed with bedside nurse  Thank you for allowing the Palliative Medicine Team to assist in the care of this patient.   Time In: 0850 Time Out: 0915 Total Time 25 Prolonged Time Billed No    Greater than 50%  of this time was spent counseling and coordinating care related to the above assessment and plan.   Micheline Rough, MD  03/20/2015, 9:14 AM  Please contact Palliative Medicine Team phone at [phone] for questions and concerns.

## 2015-03-20 NOTE — Discharge Summary (Signed)
Physician Discharge Summary  [name] [name] [facility] [id number] DOB: [birth year] DOA: 03/16/2015  PCP: Hollace Kinnier, DO  Admit date: 03/16/2015 Discharge date: 03/20/2015  Time spent: > 35 minutes  Recommendations for Outpatient Follow-up:  1. D/c'd aspirin due to recent epistaxis 2. Held antihypertensives on d/c. Pt normotensive off of antihypertensive regimen. 3. Family would like hospice services to assist with care of mother moving forward 4. If ok with family may consider obtaining Serum creatinine within the next 1 week but if no lab draws preferred due to hospice status then may consider not obtaining 5. Lasix on hold due to ARF and dehydration may consider restarting 6. Consider continuing imdur and hydralazine with continued improvement in condition if patient and family wishes for her CHF  Discharge Diagnoses:  Principal Problem:   Symptomatic anemia Active Problems:   Atrial fibrillation (HCC)   Biventricular implantable cardioverter-defibrillator LV lead off   Chronic systolic congestive heart failure ([place])   Adult failure to thrive   Hypothyroidism   Acute on chronic renal failure (HCC)   Dementia   Pancytopenia ([place])   Epistaxis   Discharge Condition: stable  Diet recommendation: regular diet  Filed Weights   03/18/15 0600 03/19/15 0407 03/20/15 0611  Weight: 57 kg (125 lb 10.6 oz) 58 kg (127 lb 13.9 oz) 53.9 kg (118 lb 13.3 oz)    History of present illness:  [name] [name] is a 81 y.o. female from [place] with h/o myelodysplastic syndrome and pancytopenia, A-fib on ASA, CKD, AICD, systolic CHF, Dementia who presents with epistaxis and is found to have ARF and anemia. Epistaxis was controlled by afrin and DDAVP and has resolved. She is admitted for treatment of renal failure and anemia. Also found to have a positive UA.   Hospital Course:  Symptomatic anemia - likely due to underlying myelodysplastic syndrome  - has been transfused 1 U PRBC  with Hb improving from 7.7 to 9.1 on day of d/c  Active Problems: Pancytopenia - stable- related to myelodysplastic syndrome  AKI on CKD 3 - suspecting prerenal etiology but may also be due to ATN- mildly improved today - Lasix on hold-may consider restarting with improvement in condition. - baseline Cr about 1.4- 1.6 - May consider reassessing S creatinine 1 wk after d/c  Nose bleed - in setting of thrombocytopenia and ASA use- resolved  Atrial fibrillation - paroxsymal - currently NSR, continue home B blocker regimen - on ASA 81 mg at home- currently on hold due to nose bleed- cont to hold on d/c will defer to pcp at facility when to continue if able to continue moving forward. Due to recent nose bleed and profound anemia I will hold aspirin on d/c   Biventricular implantable cardioverter- defibrillator  Chronic systolic congestive heart failure  -EF 0000000 and Gr 1 diastolic CHF noted on last ECHO in 2014 - Lasix on hold due to ARF - hold imdur and hydralazine on d/c will defer to pcp when to continue - continue carvedilol  UTI - completed 3 days of antibiotics - culture negative thus far  Adult failure to thrive - monitor PO intake- started Ensure  Hypothyroidism - cont Synthroid- TSH and Free T4 normal  Dementia - cont Namenda, Seroquel and Risperdal  Procedures:  none  Consultations:  Palliative  Discharge Exam: Filed Vitals:   03/20/15 1329  BP: 121/66  Pulse: 96  Temp: 97.5 F (36.4 C)  Resp: 18    General: Pt in nad, alert and awake Cardiovascular: s1  and s2 wnl, no rubs Respiratory: no increased wob, no wheezes  Discharge Instructions   Discharge Instructions    Call MD for:  redness, tenderness, or signs of infection (pain, swelling, redness, odor or green/yellow discharge around incision site)    Complete by:  As directed      Call MD for:  temperature >100.4    Complete by:  As directed      Diet - low sodium heart healthy     Complete by:  As directed      Discharge instructions    Complete by:  As directed   D/c to SNF with hospice assistance while at SNF     Increase activity slowly    Complete by:  As directed           Current Discharge Medication List    CONTINUE these medications which have CHANGED   Details  fentaNYL (DURAGESIC - DOSED MCG/HR) 25 MCG/HR patch Place 1 patch onto the skin every 3 days for pain Qty: 1 patch, Refills: 0   Associated Diagnoses: Pain in joint, lower leg, unspecified laterality      CONTINUE these medications which have NOT CHANGED   Details  barrier cream (NON-SPECIFIED) CREA Apply 1 application topically daily.    Calcium Carbonate-Vitamin D (CALTRATE 600+D) 600-400 MG-UNIT per tablet Take one tablet by mouth four times daily with meals Qty: 120 tablet, Refills: 12    docusate sodium (COLACE) 100 MG capsule Take 1 capsule (100 mg total) by mouth 2 (two) times daily. Qty: 60 capsule, Refills: 3    ferrous gluconate (FERGON) 324 MG tablet Take 1 tablet (324 mg total) by mouth daily with breakfast. Qty: 30 tablet, Refills: 3    hydroxypropyl methylcellulose / hypromellose (ISOPTO TEARS / GONIOVISC) 2.5 % ophthalmic solution Place 1 drop into both eyes 3 (three) times daily.    levothyroxine (SYNTHROID, LEVOTHROID) 100 MCG tablet TAKE 1 TABLET BY MOUTH 30 MIN. BEFORE BREAKFAST DAILY Qty: 30 tablet, Refills: 3    NAMENDA XR 28 MG CP24 24 hr capsule TAKE ONE TABLET BY MOUTH ONCE DAILY FOR MEMORY Qty: 30 capsule, Refills: 3    nystatin (MYCOSTATIN/NYSTOP) 100000 UNIT/GM POWD Apply 1 g topically daily.    pantoprazole (PROTONIX) 40 MG tablet Take 1 tablet by mouth 2 times daily before a meal for GERD Qty: 60 tablet, Refills: 0    polyethylene glycol (MIRALAX / GLYCOLAX) packet Take 17 g by mouth daily. For constipation. Mix with Cranberry juice in the morning Qty: 30 each, Refills: 6    potassium chloride SA (KLOR-CON M20) 20 MEQ tablet Take two tablets by mouth  twice daily for hypokalemia Qty: 120 tablet, Refills: 3    QUEtiapine (SEROQUEL) 50 MG tablet Take one tablet at 3:00 pm and one tablet at bedtime Qty: 60 tablet, Refills: 0    risperiDONE (RISPERDAL) 0.5 MG tablet TAKE ONE TABLET BY MOUTH EVERY 6 HOURS AS NEEDED FOR AGITATION. Qty: 120 tablet, Refills: 3    senna-docusate (SENOKOT-S) 8.6-50 MG per tablet Take 2 tablets by mouth daily. Hold for loose stools Qty: 60 tablet, Refills: 3    !! sertraline (ZOLOFT) 100 MG tablet Take one 100mg  tablet with 50mg  tablet = 150mg  once daily Qty: 30 tablet, Refills: 0    !! sertraline (ZOLOFT) 50 MG tablet Take one 50mg  tablet with 100mg  tablet = 150mg  once daily Qty: 30 tablet, Refills: 0    Vitamin D, Ergocalciferol, (DRISDOL) 50000 UNITS CAPS capsule Take 50,000 Units by  mouth every 7 (seven) days. Wednesday    AMBULATORY NON FORMULARY MEDICATION Wheelchair and Cushion. Patient is unable to propel herself-she is nonambulatory Qty: 1 each, Refills: 0    feeding supplement, ENSURE ENLIVE, (ENSURE ENLIVE) LIQD Take 237 mLs by mouth 2 (two) times daily between meals. Qty: 237 mL, Refills: 12     !! - Potential duplicate medications found. Please discuss with provider.    STOP taking these medications     allopurinol (ZYLOPRIM) 100 MG tablet      aspirin EC 81 MG tablet      carvedilol (COREG) 12.5 MG tablet      furosemide (LASIX) 40 MG tablet      hydrALAZINE (APRESOLINE) 10 MG tablet      HYDROcodone-acetaminophen (NORCO/VICODIN) 5-325 MG tablet      isosorbide mononitrate (IMDUR) 30 MG 24 hr tablet        Allergies  Allergen Reactions  . Ace Inhibitors Cough  . Requip [Ropinirole Hcl] Other (See Comments)    confusion      The results of significant diagnostics from this hospitalization (including imaging, microbiology, ancillary and laboratory) are listed below for reference.    Significant Diagnostic Studies: Dg Chest Port 1 View  02/21/2015  CLINICAL DATA:   Altered mental status EXAM: PORTABLE CHEST 1 VIEW COMPARISON:  January 10, 2015 FINDINGS: There is no edema or consolidation. Heart size and pulmonary vascularity are within normal limits. Pacemaker leads are attached to the right atrium right ventricle, stable. There is atherosclerotic change in aorta. No adenopathy. No bone lesions. IMPRESSION: No edema or consolidation.  No change in cardiac silhouette. Electronically Signed   By: Lowella Grip III M.D.   On: 02/21/2015 10:08    Microbiology: Recent Results (from the past 240 hour(s))  Urine culture     Status: None   Collection Time: 03/16/15  5:27 PM  Result Value Ref Range Status   Specimen Description URINE, RANDOM  Final   Special Requests NONE  Final   Culture MULTIPLE SPECIES PRESENT, SUGGEST RECOLLECTION  Final   Report Status 03/19/2015 FINAL  Final     Labs: Basic Metabolic Panel:  Recent Labs Lab 03/16/15 1441 03/17/15 0450 03/18/15 0445 03/19/15 0632  NA 138 139 139 138  K 5.0 4.1 3.7 3.6  CL 106 105 101 102  CO2 22 26 27 27   GLUCOSE 103* 108* 95 120*  BUN 45* 47* 38* 36*  CREATININE 3.08* 2.97* 2.48* 2.09*  CALCIUM 9.7 8.9 8.9 9.0   Liver Function Tests:  Recent Labs Lab 03/17/15 0450  AST 21  ALT 9*  ALKPHOS 57  BILITOT 1.3*  PROT 6.2*  ALBUMIN 2.7*   No results for input(s): LIPASE, AMYLASE in the last 168 hours. No results for input(s): AMMONIA in the last 168 hours. CBC:  Recent Labs Lab 03/16/15 1857 03/17/15 0450 03/18/15 0445 03/19/15 1021  WBC 3.1* 2.7* 1.9* 2.2*  NEUTROABS 2.1 1.9  --   --   HGB 7.7* 8.2* 8.0* 9.1*  HCT 24.7* 25.8* 24.9* 28.2*  MCV 94.3 92.8 92.2 92.5  PLT 44* 32* 37* 38*   Cardiac Enzymes: No results for input(s): CKTOTAL, CKMB, CKMBINDEX, TROPONINI in the last 168 hours. BNP: BNP (last 3 results) No results for input(s): BNP in the last 8760 hours.  ProBNP (last 3 results) No results for input(s): PROBNP in the last 8760 hours.  CBG: No results for  input(s): GLUCAP in the last 168 hours.     Signed:  Velvet Bathe  [facility] 03/20/2015, 1:52 PM

## 2015-03-20 NOTE — Progress Notes (Signed)
i called the vendor about pt pace maker needed to be set off as ordered

## 2015-03-20 NOTE — Telephone Encounter (Signed)
Received call from [facility] with [facility], patient is being discharged from the hospital today and going back to [facility], just wanted to make sure Dr Mariea Clonts will still be her doctor. Yes she will be.

## 2015-03-20 NOTE — NC FL2 (Signed)
[place] LEVEL OF CARE SCREENING TOOL     IDENTIFICATION  Patient Name: [name] [name] [id number] Birthdate: 05/07/1925 Sex: female Admission Date (Current Location): 03/16/2015  [facility] and [place] Number:  Mikel Cella) [id number] Facility and Address:  [facility], [place] [address], [place], [place] [address]      Provider Number: [id number]  Attending Physician Name and Address:  Velvet Bathe, MD  Relative Name and Phone Number:       Current Level of Care: Hospital Recommended Level of Care: [place] Prior Approval Number:    Date Approved/Denied:   PASRR Number:    Discharge Plan: SNF    Current Diagnoses: Patient Active Problem List   Diagnosis Date Noted  . Symptomatic anemia 03/16/2015  . Epistaxis 03/16/2015  . SIRS (systemic inflammatory response syndrome) ([place]) 02/22/2015  . Hypernatremia 02/22/2015  . Acute kidney injury ([place]) 02/22/2015  . Pancytopenia ([place]) 02/22/2015  . UTI (lower urinary tract infection) 02/21/2015  . Acute on chronic renal failure ([place]) 02/21/2015  . Dehydration 02/21/2015  . Sepsis secondary to UTI ([place]) 02/21/2015  . Encephalopathy acute 02/21/2015  . Dementia 02/21/2015  . Loss of weight 06/20/2014  . Hiatal hernia with obstruction but no gangrene 06/20/2014  . Unintended weight loss 01/17/2014  . Hypothyroidism due to acquired atrophy of thyroid 12/06/2013  . Hypothyroidism 09/28/2013  . Chronic kidney disease (CKD), stage III (moderate) 08/20/2013  . Gout 08/20/2013  . Mixed vascular and neurodegenerative dementia 08/20/2013  . Broken ribs 07/17/2013  . Adult failure to thrive 04/16/2013  . Chronic systolic congestive heart failure ([place]) 10/24/2012  . Renal failure 10/14/2011  . Dysphagia 03/04/2011  . Biventricular implantable cardioverter-defibrillator LV lead off 09/15/2010  . CHEST PAIN 07/01/2009  . left ventricular lead-diaphragmatic stimulation-inactivated 06/03/2009   . PALPITATIONS 04/18/2009  . HYPOKALEMIA 03/20/2009  . Atrial fibrillation ([place]) 03/14/2009  . RHINITIS 08/01/2007  . BRONCHITIS, CHRONIC 08/01/2007  . OBSTRUCTIVE SLEEP APNEA 04/20/2007  . RESTLESS LEG SYNDROME 04/20/2007    Orientation ACTIVITIES/SOCIAL BLADDER RESPIRATION    Self, Time  Active Continent Normal  BEHAVIORAL SYMPTOMS/MOOD NEUROLOGICAL BOWEL NUTRITION STATUS      Continent Diet (regular)  PHYSICIAN VISITS COMMUNICATION OF NEEDS Height & Weight Skin  30 days Verbally 5\' 4"  (162.6 cm) 125 lbs. Normal          AMBULATORY STATUS RESPIRATION    Supervision limited Normal      Personal Care Assistance Level of Assistance  Bathing, Feeding, Dressing Bathing Assistance: Limited assistance Feeding assistance: Independent Dressing Assistance: Limited assistance      Functional Limitations Info  Sight, Hearing, Speech Sight Info: Adequate Hearing Info: Adequate Speech Info: Adequate       SPECIAL CARE FACTORS FREQUENCY  PT (By licensed PT), OT (By licensed OT)     PT Frequency: 5 OT Frequency: 5           Additional Factors Info  Code Status, Allergies Code Status Info: DNR Allergies Info: ACE Inhibitors, Requip           Current Medications (03/20/2015): Current Facility-Administered Medications  Medication Dose Route Frequency Provider Last Rate Last Dose  . 0.9 %  sodium chloride infusion   Intravenous Continuous Velvet Bathe, MD 50 mL/hr at 03/20/15 1118    . acetaminophen (TYLENOL) tablet 650 mg  650 mg Oral Q6H PRN Lavina Hamman, MD       Or  . acetaminophen (TYLENOL) suppository 650 mg  650 mg Rectal  Q6H PRN Lavina Hamman, MD      . feeding supplement (ENSURE ENLIVE) (ENSURE ENLIVE) liquid 237 mL  237 mL Oral TID BM Debbe Odea, MD   237 mL at 03/20/15 0842  . fentaNYL (DURAGESIC - dosed mcg/hr) patch 25 mcg  25 mcg Transdermal Q72H Lavina Hamman, MD   25 mcg at 03/19/15 2015  . ferrous gluconate (FERGON) tablet 324 mg  324 mg Oral  Q breakfast Lavina Hamman, MD   324 mg at 03/20/15 0843  . levothyroxine (SYNTHROID, LEVOTHROID) tablet 100 mcg  100 mcg Oral QAC breakfast Lavina Hamman, MD   100 mcg at 03/20/15 0843  . memantine (NAMENDA XR) 24 hr capsule 28 mg  28 mg Oral Daily Lavina Hamman, MD   28 mg at 03/20/15 0936  . ondansetron (ZOFRAN) tablet 4 mg  4 mg Oral Q6H PRN Lavina Hamman, MD       Or  . ondansetron [facility]) injection 4 mg  4 mg Intravenous Q6H PRN Lavina Hamman, MD      . pantoprazole (PROTONIX) EC tablet 40 mg  40 mg Oral BID AC Lavina Hamman, MD   40 mg at 03/20/15 0842  . polyethylene glycol (MIRALAX / GLYCOLAX) packet 17 g  17 g Oral Daily Lavina Hamman, MD   17 g at 03/20/15 0936  . QUEtiapine (SEROQUEL) tablet 50 mg  50 mg Oral BID Debbe Odea, MD   50 mg at 03/20/15 1118  . risperiDONE (RISPERDAL) tablet 0.5 mg  0.5 mg Oral Daily Lavina Hamman, MD   0.5 mg at 03/20/15 0936  . senna-docusate (Senokot-S) tablet 2 tablet  2 tablet Oral Daily Lavina Hamman, MD   2 tablet at 03/20/15 [phone]  . sertraline (ZOLOFT) tablet 100 mg  100 mg Oral QHS Lavina Hamman, MD   100 mg at 03/19/15 2223  . sertraline (ZOLOFT) tablet 50 mg  50 mg Oral QHS Lavina Hamman, MD   50 mg at 03/19/15 2224   Do not use this list as official medication orders. Please verify with discharge summary.  Discharge Medications:   Medication List    STOP taking these medications        allopurinol 100 MG tablet  Commonly known as:  ZYLOPRIM     aspirin EC 81 MG tablet     furosemide 40 MG tablet  Commonly known as:  LASIX     hydrALAZINE 10 MG tablet  Commonly known as:  APRESOLINE     HYDROcodone-acetaminophen 5-325 MG tablet  Commonly known as:  NORCO/VICODIN     isosorbide mononitrate 30 MG 24 hr tablet  Commonly known as:  IMDUR      TAKE these medications        AMBULATORY NON FORMULARY MEDICATION  Wheelchair and Cushion. Patient is unable to propel herself-she is nonambulatory     barrier cream Crea   Commonly known as:  non-specified  Apply 1 application topically daily.     Calcium Carbonate-Vitamin D 600-400 MG-UNIT tablet  Commonly known as:  CALTRATE 600+D  Take one tablet by mouth four times daily with meals     carvedilol 12.5 MG tablet  Commonly known as:  COREG  Take 0.5 tablets (6.25 mg total) by mouth 2 (two) times daily with a meal.     docusate sodium 100 MG capsule  Commonly known as:  COLACE  Take 1 capsule (100 mg total) by mouth 2 (two)  times daily.     feeding supplement (ENSURE ENLIVE) Liqd  Take 237 mLs by mouth 2 (two) times daily between meals.     fentaNYL 25 MCG/HR patch  Commonly known as:  DURAGESIC - dosed mcg/hr  Place 1 patch onto the skin every 3 days for pain     ferrous gluconate 324 MG tablet  Commonly known as:  FERGON  Take 1 tablet (324 mg total) by mouth daily with breakfast.     hydroxypropyl methylcellulose / hypromellose 2.5 % ophthalmic solution  Commonly known as:  ISOPTO TEARS / GONIOVISC  Place 1 drop into both eyes 3 (three) times daily.     levothyroxine 100 MCG tablet  Commonly known as:  SYNTHROID, LEVOTHROID  TAKE 1 TABLET BY MOUTH 30 MIN. BEFORE BREAKFAST DAILY     NAMENDA XR 28 MG Cp24 24 hr capsule  Generic drug:  memantine  TAKE ONE TABLET BY MOUTH ONCE DAILY FOR MEMORY     nystatin 100000 UNIT/GM Powd  Apply 1 g topically daily.     pantoprazole 40 MG tablet  Commonly known as:  PROTONIX  Take 1 tablet by mouth 2 times daily before a meal for GERD     polyethylene glycol packet  Commonly known as:  MIRALAX / GLYCOLAX  Take 17 g by mouth daily. For constipation. Mix with Cranberry juice in the morning     potassium chloride SA 20 MEQ tablet  Commonly known as:  KLOR-CON M20  Take two tablets by mouth twice daily for hypokalemia     QUEtiapine 50 MG tablet  Commonly known as:  SEROQUEL  Take one tablet at 3:00 pm and one tablet at bedtime     risperiDONE 0.5 MG tablet  Commonly known as:  RISPERDAL   TAKE ONE TABLET BY MOUTH EVERY 6 HOURS AS NEEDED FOR AGITATION.     senna-docusate 8.6-50 MG tablet  Commonly known as:  Senokot-S  Take 2 tablets by mouth daily. Hold for loose stools     sertraline 100 MG tablet  Commonly known as:  ZOLOFT  Take one 100mg  tablet with 50mg  tablet = 150mg  once daily     sertraline 50 MG tablet  Commonly known as:  ZOLOFT  Take one 50mg  tablet with 100mg  tablet = 150mg  once daily     Vitamin D (Ergocalciferol) 50000 UNITS Caps capsule  Commonly known as:  DRISDOL  Take 50,000 Units by mouth every 7 (seven) days. Wednesday        Relevant Imaging Results:  Relevant Lab Results:  Recent Labs    Additional Information ss#- [id number]  Cranford Mon, [place]

## 2015-03-20 NOTE — Progress Notes (Signed)
Patient will discharge to [facility] Anticipated discharge date:03/20/15 Family notified: dtrs are at bedside Transportation by [place]- called at [place] signing off.  Domenica Reamer, [place] Social Worker [phone]

## 2015-03-24 ENCOUNTER — Telehealth: Payer: Self-pay | Admitting: *Deleted

## 2015-03-24 NOTE — Telephone Encounter (Signed)
Therese with Hospice called and stated that patient was discharged from Hospital on the 10th and the hospital discontinued Aspirin, Allopurinol, Hydralazine,Isosorbide and Lasix due to renal failure. [facility] has not stopped these medications. Blood Pressure still 120/70. Should they CONTINUE or STOP these medications? Please Advise Also Patient is yelling out a lot during the day. Patient is on Hydrocodone and Fentanyl Patch. Can they increase the Hydrocodone to every four hours standing? Nurse stated that she gets her in the bed and she doesn't act like she is in pain, nurse is wondering if it is anxiety/aggitation rather than pain. Thinks maybe she needs something for that standing instead of prn. Please Advise.

## 2015-03-24 NOTE — Telephone Encounter (Signed)
Apparently the facility did not consider the discharge medication list to be the discharge orders.  This would have made sense.  D/c the hydralazine, isosorbide, lasix as per the discharge summary.  There may be some others hospice would also like to stop.  Just let me know.   Schedule hydrocodone 5/325mg  one tab q 4 hours routinely while awake.  I'm sure a new Rx is needed. Thanks.

## 2015-03-26 ENCOUNTER — Telehealth: Payer: Self-pay | Admitting: Internal Medicine

## 2015-03-26 NOTE — Telephone Encounter (Signed)
Dr. Earlie Counts already taken care of patient's medications. Therese with Hospice will fax updated medications

## 2015-03-26 NOTE — Telephone Encounter (Signed)
New Message    Office calling stating that pt is in Hospice care and the pt's family has requested for pt's defib to be turned off. Please call back and advise.

## 2015-03-28 NOTE — Telephone Encounter (Signed)
Spoke to Hospice RN about faxing order. Hospice RN voiced understanding. Fax # given. Will defer to SK once this is received.

## 2015-04-01 NOTE — Telephone Encounter (Signed)
Dr. Caryl Comes signed order- faxed back to Hospice at [phone].

## 2015-04-07 ENCOUNTER — Encounter: Payer: Self-pay | Admitting: *Deleted

## 2015-04-07 ENCOUNTER — Telehealth: Payer: Self-pay | Admitting: Internal Medicine

## 2015-04-07 ENCOUNTER — Telehealth: Payer: Self-pay | Admitting: *Deleted

## 2015-04-07 NOTE — Telephone Encounter (Signed)
Therese with Hospice called and stated that patient has had a significant change. Temp: 102.8 and feels like patient has had a Cerebellar Vascular Bleed. BP 138/80, P: 36-40 SpO2: 91%, R 48 with open mouth breathing. Not swallowing, not moving right side only Left leg alittle. Only responding minimally of "oh". Left glandular swelling under ear and neck to chin. Does appear like she has had a bleed. Daughter is present and only wants comfort care. Hospice Dr. Has been contacted and orders give. They are stopping all medications except Ativan, Tylenol and Oxycodone. Report will also be faxed. FYI.

## 2015-04-07 NOTE — Telephone Encounter (Signed)
Thank you for the information. I hope she is comfortable.

## 2015-04-07 NOTE — Telephone Encounter (Signed)
I spoke with [facility] from [facility]- they were needing a rep to come turn off the patient's ICD. I advised her I would call [employer]- I spoke with Candie Echevaria- rep with [employer] and they will be by there this afternoon and turn the device off. [name] [name] is aware- per [name] [name], the patient is located at [employer] on [address]. (building on the right). [name] [name] is aware of the location.

## 2015-04-07 NOTE — Telephone Encounter (Signed)
F/u  Rn from hospice calling to follow up on order to turn off ICD- was made aware of note from 11/22 saying that an order had been faxed. Rn still requested to speak w/ Rn. Please call back and discus.

## 2015-04-10 DEATH — deceased

## 2015-04-17 ENCOUNTER — Ambulatory Visit: Payer: Medicare HMO | Admitting: Internal Medicine

## 2015-04-30 ENCOUNTER — Encounter: Payer: Self-pay | Admitting: Internal Medicine

## 2015-05-03 ENCOUNTER — Other Ambulatory Visit: Payer: Self-pay | Admitting: Internal Medicine

## 2015-05-11 DEATH — deceased

## 2015-05-25 IMAGING — CT CT HEAD W/O CM
2 series · 16 of 30 positions shown, 20 images · non-contrast
Comparison: 07/29/2013

CLINICAL DATA: Initial evaluation for behavioral change, paranoia
resolving facial bruise

EXAM:
CT HEAD WITHOUT CONTRAST
TECHNIQUE: Contiguous axial images were obtained from the base of the skull
through the vertex without intravenous contrast.

[Series 2: head w/o · axial · non-contrast · 0.45mm/px · z∈[-156,-36]mm · 13 of 30 slices shown, 17 images]
[im 3/30  brain]
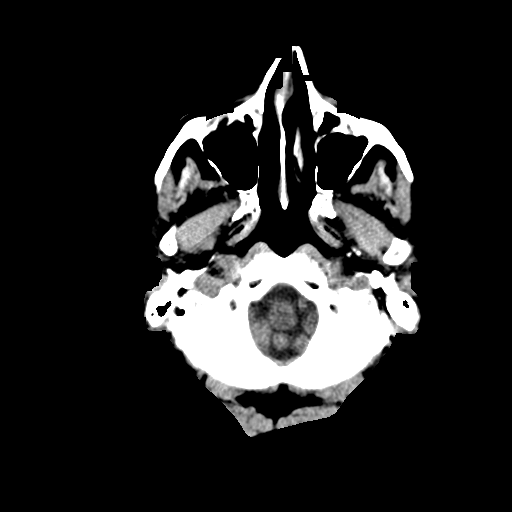
[im 3/30  bone]
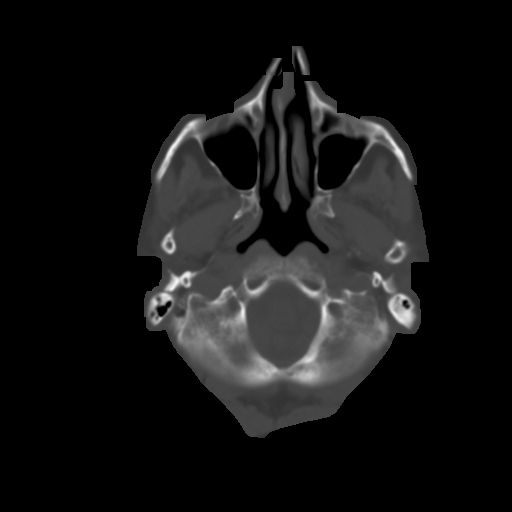
[im 5/30  brain]
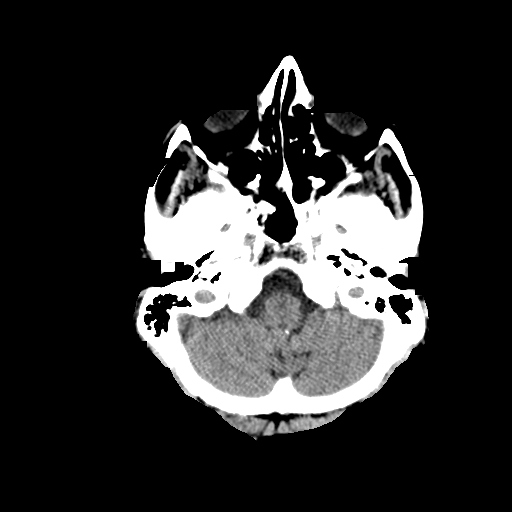
[im 7/30  brain]
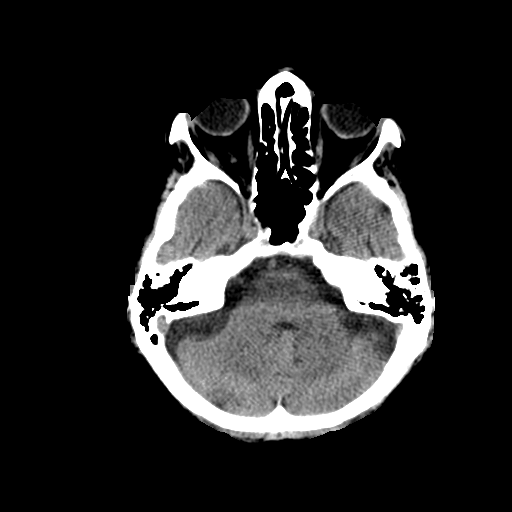
[im 9/30  brain]
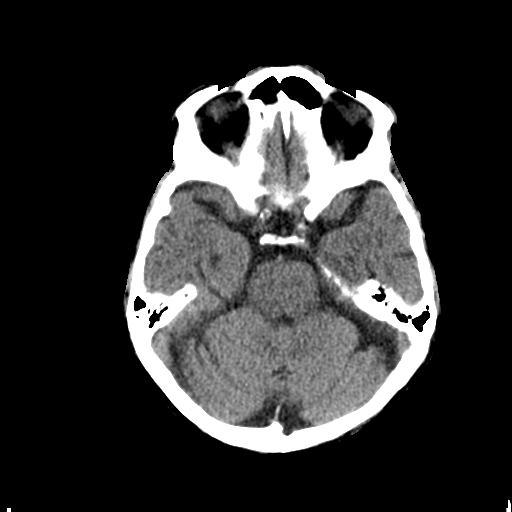
[im 11/30  brain]
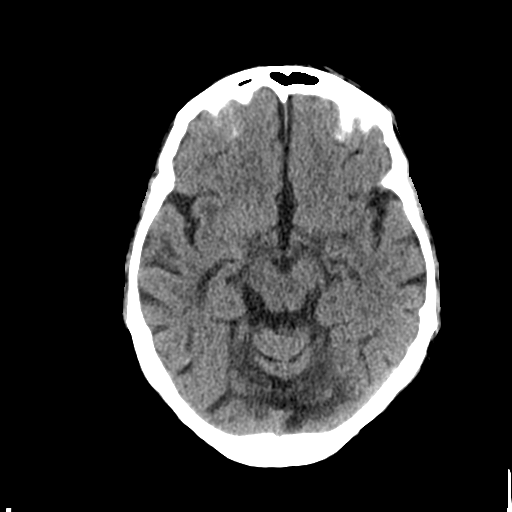
[im 11/30  bone]
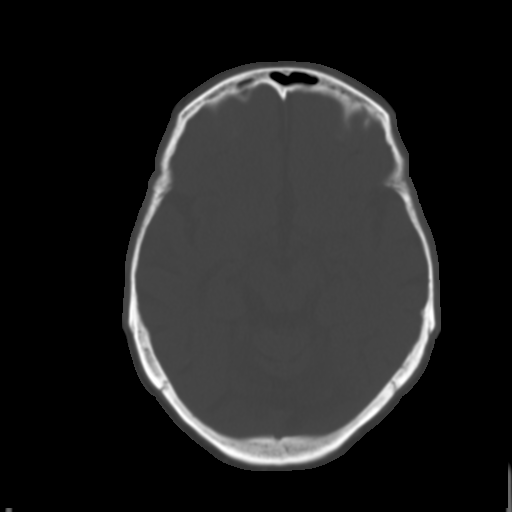
[im 13/30  brain]
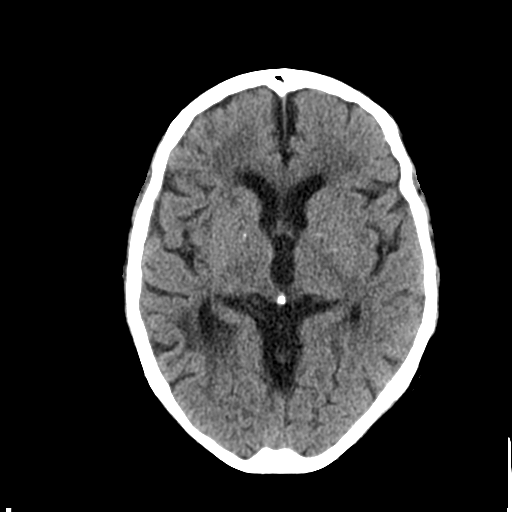
[im 15/30  brain]
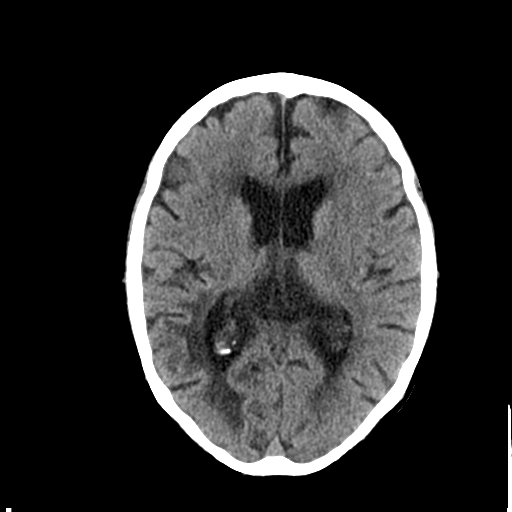
[im 17/30  brain]
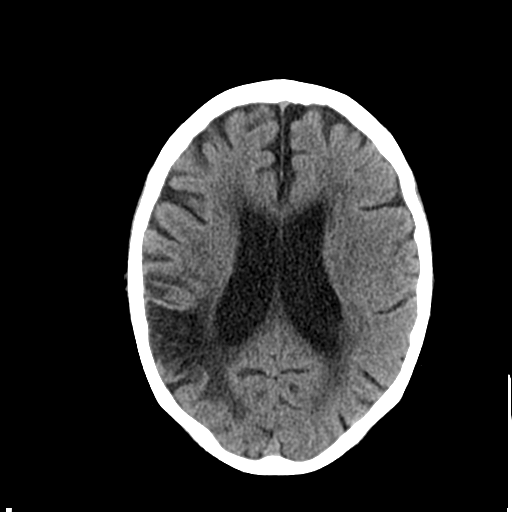
[im 19/30  brain]
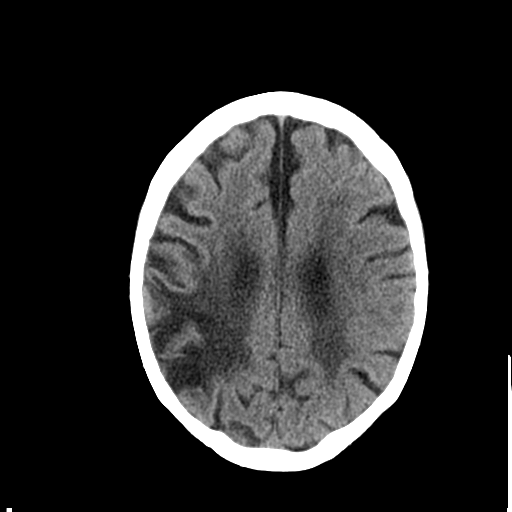
[im 19/30  bone]
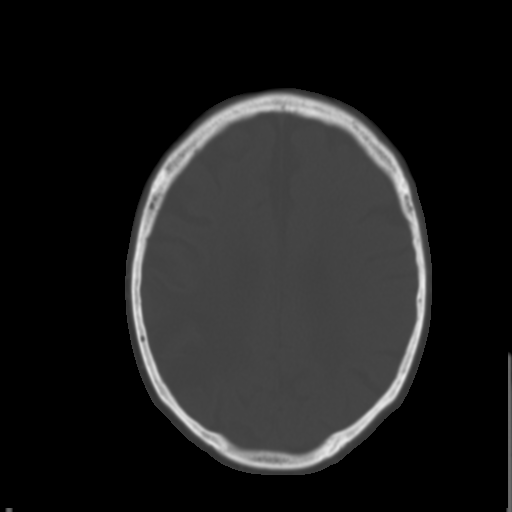
[im 21/30  brain]
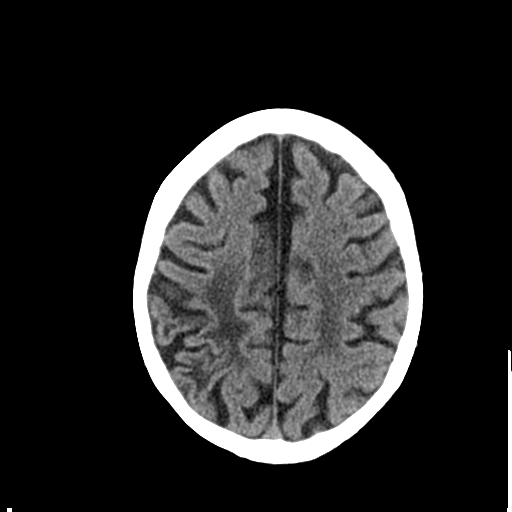
[im 23/30  brain]
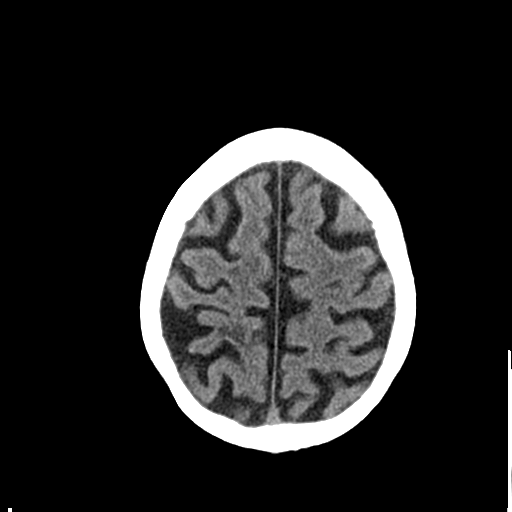
[im 25/30  brain]
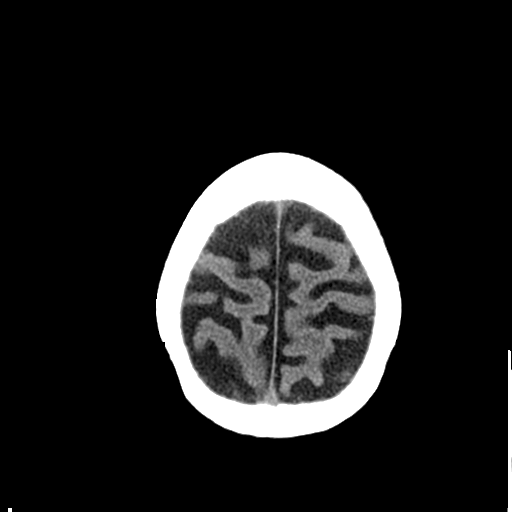
[im 27/30  brain]
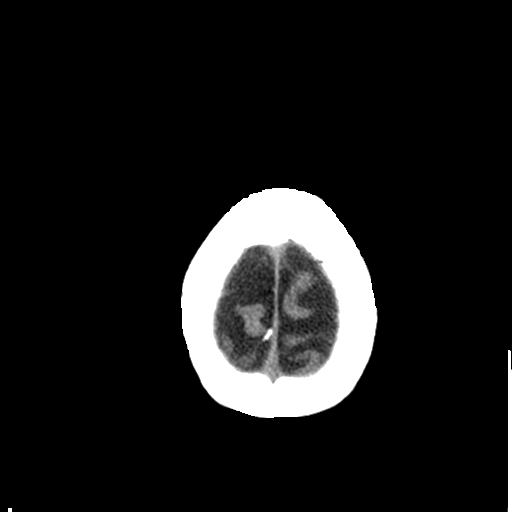
[im 27/30  bone]
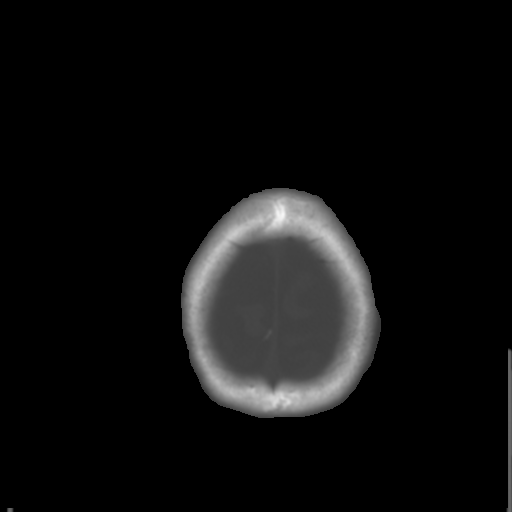

[Series 3: bone windows · axial · 0.45mm/px · z∈[-156,-116]mm · 3 of 30 slices shown]
[im 3/30  bone]
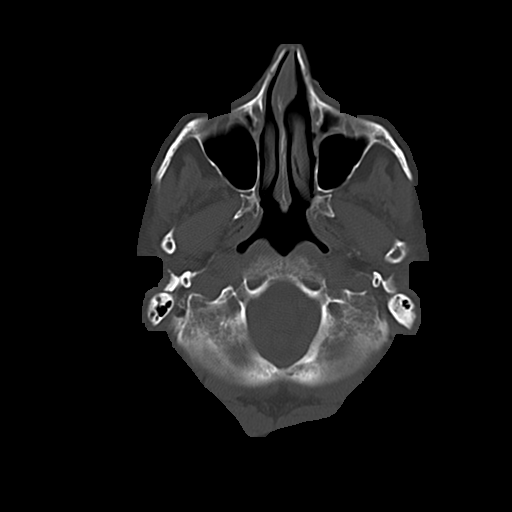
[im 7/30  bone]
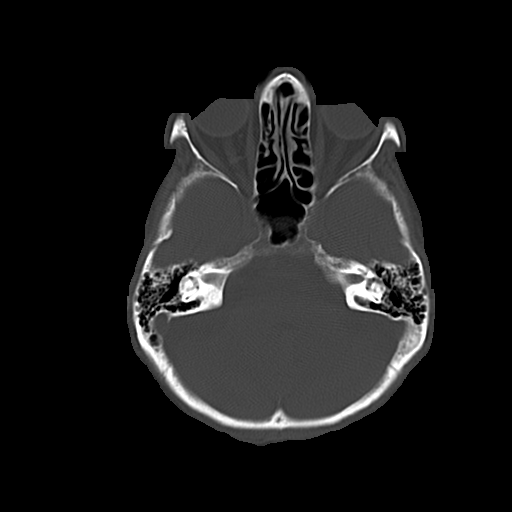
[im 11/30  bone]
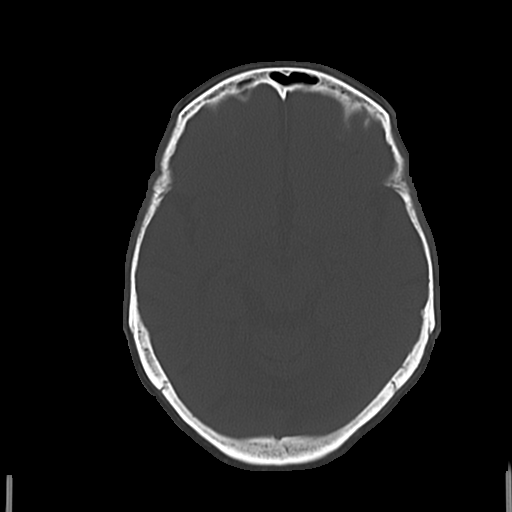

[16 of 30 positions shown; findings below may reference images not displayed]

FINDINGS: Severe diffuse atrophy and low attenuation in the deep white matter.
Old right infarct at the junction of the parietal and occipital
lobes stable from prior study. No evidence of new vascular territory
infarct. No hemorrhage or extra-axial fluid. Calvarium intact.
Visualized portions of the paranasal sinuses well aerated.
IMPRESSION: Chronic involutional change with old right infarct. No change from
07/29/2013.
